# Patient Record
Sex: Female | Born: 1980 | Race: Black or African American | Hispanic: No | Marital: Single | Smoking: Former smoker
Health system: Southern US, Community
[De-identification: ages and names within clinical notes are randomized; demographics above are authoritative.]

## PROBLEM LIST (undated history)

## (undated) ENCOUNTER — Inpatient Hospital Stay (HOSPITAL_COMMUNITY): Payer: Self-pay

## (undated) DIAGNOSIS — R011 Cardiac murmur, unspecified: Secondary | ICD-10-CM

## (undated) DIAGNOSIS — J302 Other seasonal allergic rhinitis: Secondary | ICD-10-CM

## (undated) DIAGNOSIS — R51 Headache: Secondary | ICD-10-CM

## (undated) DIAGNOSIS — K219 Gastro-esophageal reflux disease without esophagitis: Secondary | ICD-10-CM

## (undated) DIAGNOSIS — F32A Depression, unspecified: Secondary | ICD-10-CM

## (undated) DIAGNOSIS — K649 Unspecified hemorrhoids: Secondary | ICD-10-CM

## (undated) DIAGNOSIS — F419 Anxiety disorder, unspecified: Secondary | ICD-10-CM

## (undated) DIAGNOSIS — F329 Major depressive disorder, single episode, unspecified: Secondary | ICD-10-CM

## (undated) DIAGNOSIS — D259 Leiomyoma of uterus, unspecified: Secondary | ICD-10-CM

## (undated) DIAGNOSIS — D500 Iron deficiency anemia secondary to blood loss (chronic): Secondary | ICD-10-CM

## (undated) DIAGNOSIS — I10 Essential (primary) hypertension: Secondary | ICD-10-CM

## (undated) DIAGNOSIS — T7840XA Allergy, unspecified, initial encounter: Secondary | ICD-10-CM

## (undated) DIAGNOSIS — K8020 Calculus of gallbladder without cholecystitis without obstruction: Secondary | ICD-10-CM

## (undated) DIAGNOSIS — K602 Anal fissure, unspecified: Secondary | ICD-10-CM

## (undated) DIAGNOSIS — K0889 Other specified disorders of teeth and supporting structures: Secondary | ICD-10-CM

## (undated) DIAGNOSIS — N939 Abnormal uterine and vaginal bleeding, unspecified: Secondary | ICD-10-CM

## (undated) DIAGNOSIS — R05 Cough: Secondary | ICD-10-CM

## (undated) HISTORY — PX: DILATION AND CURETTAGE OF UTERUS: SHX78

## (undated) HISTORY — DX: Allergy, unspecified, initial encounter: T78.40XA

## (undated) HISTORY — DX: Calculus of gallbladder without cholecystitis without obstruction: K80.20

## (undated) HISTORY — PX: TUBAL LIGATION: SHX77

## (undated) HISTORY — PX: THERAPEUTIC ABORTION: SHX798

## (undated) HISTORY — DX: Unspecified hemorrhoids: K64.9

## (undated) HISTORY — PX: CHOLECYSTECTOMY: SHX55

---

## 1998-01-04 ENCOUNTER — Ambulatory Visit (HOSPITAL_COMMUNITY): Admission: RE | Admit: 1998-01-04 | Discharge: 1998-01-04 | Payer: Self-pay | Admitting: Pulmonary Disease

## 1999-08-29 ENCOUNTER — Inpatient Hospital Stay (HOSPITAL_COMMUNITY): Admission: AD | Admit: 1999-08-29 | Discharge: 1999-08-29 | Payer: Self-pay | Admitting: Obstetrics & Gynecology

## 1999-09-02 ENCOUNTER — Other Ambulatory Visit: Admission: RE | Admit: 1999-09-02 | Discharge: 1999-09-02 | Payer: Self-pay | Admitting: Obstetrics and Gynecology

## 1999-11-01 ENCOUNTER — Inpatient Hospital Stay (HOSPITAL_COMMUNITY): Admission: AD | Admit: 1999-11-01 | Discharge: 1999-11-01 | Payer: Self-pay | Admitting: Obstetrics & Gynecology

## 2000-04-22 ENCOUNTER — Inpatient Hospital Stay (HOSPITAL_COMMUNITY): Admission: AD | Admit: 2000-04-22 | Discharge: 2000-04-25 | Payer: Self-pay | Admitting: Obstetrics and Gynecology

## 2001-01-15 ENCOUNTER — Ambulatory Visit (HOSPITAL_COMMUNITY): Admission: RE | Admit: 2001-01-15 | Discharge: 2001-01-15 | Payer: Self-pay | Admitting: Gastroenterology

## 2001-01-15 ENCOUNTER — Encounter: Payer: Self-pay | Admitting: Gastroenterology

## 2001-01-26 ENCOUNTER — Encounter (INDEPENDENT_AMBULATORY_CARE_PROVIDER_SITE_OTHER): Payer: Self-pay | Admitting: *Deleted

## 2001-01-26 ENCOUNTER — Observation Stay (HOSPITAL_COMMUNITY): Admission: RE | Admit: 2001-01-26 | Discharge: 2001-01-27 | Payer: Self-pay | Admitting: Surgery

## 2001-01-26 HISTORY — PX: CHOLECYSTECTOMY, LAPAROSCOPIC: SHX56

## 2001-08-27 ENCOUNTER — Other Ambulatory Visit: Admission: RE | Admit: 2001-08-27 | Discharge: 2001-08-27 | Payer: Self-pay | Admitting: Family Medicine

## 2001-09-17 ENCOUNTER — Ambulatory Visit (HOSPITAL_COMMUNITY): Admission: RE | Admit: 2001-09-17 | Discharge: 2001-09-17 | Payer: Self-pay | Admitting: Gastroenterology

## 2001-09-20 ENCOUNTER — Encounter: Payer: Self-pay | Admitting: Gastroenterology

## 2001-09-20 ENCOUNTER — Ambulatory Visit (HOSPITAL_COMMUNITY): Admission: RE | Admit: 2001-09-20 | Discharge: 2001-09-20 | Payer: Self-pay | Admitting: Gastroenterology

## 2003-06-27 ENCOUNTER — Emergency Department (HOSPITAL_COMMUNITY): Admission: EM | Admit: 2003-06-27 | Discharge: 2003-06-28 | Payer: Self-pay | Admitting: *Deleted

## 2003-12-10 ENCOUNTER — Emergency Department (HOSPITAL_COMMUNITY): Admission: EM | Admit: 2003-12-10 | Discharge: 2003-12-10 | Payer: Self-pay | Admitting: Internal Medicine

## 2004-05-29 ENCOUNTER — Emergency Department (HOSPITAL_COMMUNITY): Admission: EM | Admit: 2004-05-29 | Discharge: 2004-05-29 | Payer: Self-pay | Admitting: Family Medicine

## 2004-07-14 ENCOUNTER — Encounter (INDEPENDENT_AMBULATORY_CARE_PROVIDER_SITE_OTHER): Payer: Self-pay | Admitting: Nurse Practitioner

## 2004-07-18 ENCOUNTER — Emergency Department (HOSPITAL_COMMUNITY): Admission: EM | Admit: 2004-07-18 | Discharge: 2004-07-18 | Payer: Self-pay | Admitting: Family Medicine

## 2004-08-16 ENCOUNTER — Emergency Department (HOSPITAL_COMMUNITY): Admission: EM | Admit: 2004-08-16 | Discharge: 2004-08-16 | Payer: Self-pay | Admitting: Family Medicine

## 2004-08-29 ENCOUNTER — Emergency Department (HOSPITAL_COMMUNITY): Admission: EM | Admit: 2004-08-29 | Discharge: 2004-08-29 | Payer: Self-pay | Admitting: Family Medicine

## 2005-07-22 ENCOUNTER — Emergency Department (HOSPITAL_COMMUNITY): Admission: EM | Admit: 2005-07-22 | Discharge: 2005-07-22 | Payer: Self-pay | Admitting: Family Medicine

## 2005-08-05 ENCOUNTER — Emergency Department (HOSPITAL_COMMUNITY): Admission: EM | Admit: 2005-08-05 | Discharge: 2005-08-05 | Payer: Self-pay | Admitting: Family Medicine

## 2005-08-28 ENCOUNTER — Emergency Department (HOSPITAL_COMMUNITY): Admission: EM | Admit: 2005-08-28 | Discharge: 2005-08-28 | Payer: Self-pay | Admitting: Family Medicine

## 2005-09-24 ENCOUNTER — Ambulatory Visit: Payer: Self-pay | Admitting: Nurse Practitioner

## 2005-09-24 ENCOUNTER — Ambulatory Visit: Payer: Self-pay | Admitting: *Deleted

## 2005-10-09 ENCOUNTER — Ambulatory Visit: Payer: Self-pay | Admitting: Nurse Practitioner

## 2005-11-10 ENCOUNTER — Ambulatory Visit: Payer: Self-pay | Admitting: Nurse Practitioner

## 2005-12-01 ENCOUNTER — Ambulatory Visit: Payer: Self-pay | Admitting: Nurse Practitioner

## 2006-01-02 ENCOUNTER — Ambulatory Visit: Payer: Self-pay | Admitting: Nurse Practitioner

## 2006-01-02 LAB — CONVERTED CEMR LAB
TSH: 1.093 microintl units/mL
WBC, blood: 7.1 10*3/uL

## 2006-05-04 ENCOUNTER — Ambulatory Visit: Payer: Self-pay | Admitting: Nurse Practitioner

## 2006-06-23 ENCOUNTER — Ambulatory Visit: Payer: Self-pay | Admitting: Nurse Practitioner

## 2006-10-01 ENCOUNTER — Ambulatory Visit: Payer: Self-pay | Admitting: Nurse Practitioner

## 2007-01-26 ENCOUNTER — Ambulatory Visit: Payer: Self-pay | Admitting: Family Medicine

## 2007-03-19 ENCOUNTER — Encounter: Payer: Self-pay | Admitting: Nurse Practitioner

## 2007-03-19 DIAGNOSIS — J309 Allergic rhinitis, unspecified: Secondary | ICD-10-CM | POA: Insufficient documentation

## 2007-03-31 ENCOUNTER — Encounter (INDEPENDENT_AMBULATORY_CARE_PROVIDER_SITE_OTHER): Payer: Self-pay | Admitting: *Deleted

## 2007-04-06 ENCOUNTER — Ambulatory Visit: Payer: Self-pay | Admitting: Internal Medicine

## 2007-06-03 ENCOUNTER — Ambulatory Visit: Payer: Self-pay | Admitting: Family Medicine

## 2007-06-21 ENCOUNTER — Emergency Department (HOSPITAL_COMMUNITY): Admission: EM | Admit: 2007-06-21 | Discharge: 2007-06-21 | Payer: Self-pay | Admitting: Family Medicine

## 2007-06-30 ENCOUNTER — Emergency Department (HOSPITAL_COMMUNITY): Admission: EM | Admit: 2007-06-30 | Discharge: 2007-06-30 | Payer: Self-pay | Admitting: Family Medicine

## 2007-07-22 ENCOUNTER — Encounter (INDEPENDENT_AMBULATORY_CARE_PROVIDER_SITE_OTHER): Payer: Self-pay | Admitting: Nurse Practitioner

## 2007-07-22 ENCOUNTER — Ambulatory Visit: Payer: Self-pay | Admitting: Family Medicine

## 2007-07-22 LAB — CONVERTED CEMR LAB
AST: 19 units/L (ref 0–37)
Albumin: 4.4 g/dL (ref 3.5–5.2)
BUN: 14 mg/dL (ref 6–23)
Basophils Absolute: 0 10*3/uL (ref 0.0–0.1)
Calcium: 9 mg/dL (ref 8.4–10.5)
Chloride: 105 meq/L (ref 96–112)
Eosinophils Absolute: 0.1 10*3/uL (ref 0.0–0.7)
Glucose, Bld: 66 mg/dL — ABNORMAL LOW (ref 70–99)
HCT: 38 % (ref 36.0–46.0)
Hemoglobin: 12.5 g/dL (ref 12.0–15.0)
Lymphs Abs: 2.3 10*3/uL (ref 0.7–4.0)
Neutrophils Relative %: 55 % (ref 43–77)
Platelets: 204 10*3/uL (ref 150–400)
TSH: 0.509 microintl units/mL (ref 0.350–5.50)
Total Protein: 7.2 g/dL (ref 6.0–8.3)

## 2007-09-14 ENCOUNTER — Emergency Department (HOSPITAL_COMMUNITY): Admission: EM | Admit: 2007-09-14 | Discharge: 2007-09-14 | Payer: Self-pay | Admitting: Family Medicine

## 2007-09-28 ENCOUNTER — Ambulatory Visit: Payer: Self-pay | Admitting: Internal Medicine

## 2007-10-11 ENCOUNTER — Ambulatory Visit: Payer: Self-pay | Admitting: Family Medicine

## 2007-10-29 ENCOUNTER — Ambulatory Visit: Payer: Self-pay | Admitting: Internal Medicine

## 2007-11-08 ENCOUNTER — Ambulatory Visit: Payer: Self-pay | Admitting: Internal Medicine

## 2007-11-26 ENCOUNTER — Inpatient Hospital Stay (HOSPITAL_COMMUNITY): Admission: AD | Admit: 2007-11-26 | Discharge: 2007-11-26 | Payer: Self-pay | Admitting: Obstetrics and Gynecology

## 2008-02-21 ENCOUNTER — Ambulatory Visit (HOSPITAL_COMMUNITY): Admission: RE | Admit: 2008-02-21 | Discharge: 2008-02-21 | Payer: Self-pay | Admitting: Obstetrics

## 2008-05-22 ENCOUNTER — Ambulatory Visit (HOSPITAL_COMMUNITY): Admission: RE | Admit: 2008-05-22 | Discharge: 2008-05-22 | Payer: Self-pay | Admitting: Obstetrics

## 2008-06-29 ENCOUNTER — Ambulatory Visit (HOSPITAL_COMMUNITY): Admission: RE | Admit: 2008-06-29 | Discharge: 2008-06-29 | Payer: Self-pay | Admitting: Obstetrics

## 2008-07-10 ENCOUNTER — Inpatient Hospital Stay (HOSPITAL_COMMUNITY): Admission: RE | Admit: 2008-07-10 | Discharge: 2008-07-13 | Payer: Self-pay | Admitting: Obstetrics

## 2008-07-10 ENCOUNTER — Encounter: Payer: Self-pay | Admitting: Obstetrics

## 2008-07-20 ENCOUNTER — Inpatient Hospital Stay (HOSPITAL_COMMUNITY): Admission: AD | Admit: 2008-07-20 | Discharge: 2008-07-23 | Payer: Self-pay | Admitting: Obstetrics

## 2009-01-09 ENCOUNTER — Emergency Department (HOSPITAL_COMMUNITY): Admission: EM | Admit: 2009-01-09 | Discharge: 2009-01-09 | Payer: Self-pay | Admitting: Family Medicine

## 2009-04-12 ENCOUNTER — Emergency Department (HOSPITAL_COMMUNITY): Admission: EM | Admit: 2009-04-12 | Discharge: 2009-04-12 | Payer: Self-pay | Admitting: Family Medicine

## 2009-05-11 ENCOUNTER — Emergency Department (HOSPITAL_COMMUNITY): Admission: EM | Admit: 2009-05-11 | Discharge: 2009-05-11 | Payer: Self-pay | Admitting: Family Medicine

## 2010-10-21 LAB — POCT I-STAT, CHEM 8
BUN: 6 mg/dL (ref 6–23)
Chloride: 108 mEq/L (ref 96–112)
Creatinine, Ser: 0.6 mg/dL (ref 0.4–1.2)
Sodium: 140 mEq/L (ref 135–145)

## 2010-10-21 LAB — POCT URINALYSIS DIP (DEVICE)
Glucose, UA: NEGATIVE mg/dL
Nitrite: NEGATIVE
Specific Gravity, Urine: >=1.03 (ref 1.005–1.030)
pH: 5.5 (ref 5.0–8.0)

## 2010-10-21 LAB — POCT PREGNANCY, URINE: Preg Test, Ur: NEGATIVE

## 2010-10-28 LAB — URIC ACID: Uric Acid, Serum: 3.8 mg/dL (ref 2.4–7.0)

## 2010-10-28 LAB — CBC
HCT: 25.8 % — ABNORMAL LOW (ref 36.0–46.0)
Hemoglobin: 8.7 g/dL — ABNORMAL LOW (ref 12.0–15.0)
MCHC: 34.3 g/dL (ref 30.0–36.0)
MCV: 98.1 fL (ref 78.0–100.0)
RBC: 2.72 MIL/uL — ABNORMAL LOW (ref 3.87–5.11)
RDW: 14.2 % (ref 11.5–15.5)
RDW: 14.4 % (ref 11.5–15.5)

## 2010-10-28 LAB — COMPREHENSIVE METABOLIC PANEL
ALT: 44 U/L — ABNORMAL HIGH (ref 0–35)
ALT: 50 U/L — ABNORMAL HIGH (ref 0–35)
AST: 24 U/L (ref 0–37)
Albumin: 2.4 g/dL — ABNORMAL LOW (ref 3.5–5.2)
CO2: 27 mEq/L (ref 19–32)
Calcium: 8.2 mg/dL — ABNORMAL LOW (ref 8.4–10.5)
Creatinine, Ser: 0.79 mg/dL (ref 0.4–1.2)
GFR calc Af Amer: >60 mL/min (ref >60)
GFR calc non Af Amer: >60 mL/min (ref >60)
Glucose, Bld: 85 mg/dL (ref 70–99)
Potassium: 3.5 mEq/L (ref 3.5–5.1)
Sodium: 137 mEq/L (ref 135–145)
Sodium: 140 mEq/L (ref 135–145)
Total Bilirubin: 0.1 mg/dL — ABNORMAL LOW (ref 0.3–1.2)
Total Protein: 5.1 g/dL — ABNORMAL LOW (ref 6.0–8.3)
Total Protein: 5.8 g/dL — ABNORMAL LOW (ref 6.0–8.3)

## 2010-10-28 LAB — LACTATE DEHYDROGENASE: LDH: 278 U/L — ABNORMAL HIGH (ref 94–250)

## 2010-11-26 NOTE — Op Note (Signed)
[name], [name]                   ACCOUNT NO.:  [id number]   MEDICAL RECORD NO.:  [id number]          PATIENT TYPE:  INP   LOCATION:  9116                          FACILITY:  [facility]   PHYSICIAN:  Charles A. Clearance Coots, M.D.DATE OF BIRTH:  11-Feb-1981   DATE OF PROCEDURE:  DATE OF DISCHARGE:                               OPERATIVE REPORT   PREOPERATIVE DIAGNOSES:  1. Previous cesarean section at term, desires repeat cesarean section.  2. Low-lying placenta.   POSTOPERATIVE DIAGNOSES:  1. Previous cesarean section at term, desires repeat cesarean section.  2. Low-lying placenta.   PROCEDURE:  Repeat low-transverse cesarean section.   SURGEON:  Charles A. Clearance Coots, MD   ANESTHESIA:  Spinal.   ESTIMATED BLOOD LOSS:  1000 mL.   IV FLUIDS:  3400 mL.   URINE OUTPUT:  200 mL clear.   COMPLICATIONS:  None.   DRAINS:  Foley to gravity.   FINDINGS:  Viable female at 0826, Apgars of 8 at one minute and 9 at 5  minutes, weight of 8 pounds 1 ounce.  Normal uterus, ovaries, and  fallopian tubes.   OPERATION:  The patient was brought to the operating room and after  satisfactory spinal anesthesia, the abdomen was prepped and draped in  the usual sterile fashion and an indwelling Foley catheter was inserted  in the urinary bladder.  Pfannenstiel skin incision was made through the  previous scar with scalpel down to the fascia.  Fascia was nicked in the  midline and the fascial incision was extended to the left to the right  with curved Mayo scissors.  The superior and inferior fascial edges were  taken off of the rectus muscles with both blunt and sharp dissection.  Rectus muscle was bluntly divided in the midline.  Peritoneum was  entered digitally and was digitally extended to left and to the right.  The bladder blade was positioned in the incision and the vesicouterine  fold of peritoneum above the reflection of the urinary bladder was  grasped with forceps and was incised and undermined  with Metzenbaum  scissors.  An adhesion between the lower uterine segment, an area of the  vesicouterine fold, and the lower anterior abdominal wall was lysed with  cautery and the bladder flap was then bluntly developed.  Bladder blade  was repositioned in front of the urinary bladder placing it well off the  operative field.  Uterus was then entered transversely in the lower  uterine segment with scalpel.  Clear amniotic fluid was expelled.  The  uterine incision was then extended to the left and to the right with the  bandage scissors.  The vertex was then noted to be hyperextended and the  occiput was rotated into the incision, but could not be delivered with  fundal pressure due to the hyperextension.  A vacuum extractor was then  applied to the occiput and the occiput was then flexed into the incision  with the aid of fundal pressure from the assistant and delivery of the  vertex was then accomplished with one pull of the Mityvac mushroom cup  vacuum extractor.  The infant's mouth and nose was then suctioned with a  suction bulb and the delivery was then completed with the aid of fundal  pressure from the assistant.  The umbilical cord was doubly clamped and  cut, and the infant was handed off to the nursery staff.  The placenta  was then manually removed from the uterine cavity intact.  The  endometrial surface was thoroughly debrided with a dry lap sponge.  The  edges of the uterine incision were grasped with ring forceps.  Uterus  was closed with continuous interlocking suture of 0-Monocryl from each  corner to the center.  Hemostasis was excellent.  Pelvic cavity was then  thoroughly irrigated with warm saline solution and all clots were  removed.  The abdomen was then closed as follows.  The parietal  peritoneum was closed with continuous suture of 2-0 Monocryl.  The  fascia was closed with a continuous suture of 0-Vicryl.  Subcutaneous  tissue was thoroughly irrigated with warm  saline solution and all areas  of subcutaneous bleeding were coagulated with Bovie.  Skin was then  closed with continuous subcuticular suture of 4-0 Monocryl.  Sterile  bandage was applied to the incision closure.  Surgical technician  indicated that all needle, sponge, and instrument counts were correct  x2.  The patient tolerated procedure well, transported to the recovery  room in satisfactory condition.      Charles A. Clearance Coots, M.D.  Electronically Signed     CAH/MEDQ  D:  07/10/2008  T:  07/11/2008  Job:  [id number]

## 2010-11-26 NOTE — H&P (Signed)
NAMECATHRYN, [name]                   ACCOUNT NO.:  [id number]   MEDICAL RECORD NO.:  [id number]          PATIENT TYPE:  INP   LOCATION:  9371                          FACILITY:  [facility]   PHYSICIAN:  Roseanna Rainbow, M.D.DATE OF BIRTH:  November 05, 1980   DATE OF ADMISSION:  07/20/2008  DATE OF DISCHARGE:                              HISTORY & PHYSICAL   CHIEF COMPLAINT:  The patient is a 30 year old status post cesarean  delivery on December 28, now with elevated blood pressures and a  headache.   HISTORY OF PRESENT ILLNESS:  Please see the above.  She was scheduled  for an elective repeat cesarean delivery.  Her antepartum course had  been uncomplicated.  A [facility] visit on the day of presentation  revealed elevated blood pressures.  The patient was also complaining of  a frontal headache.   PAST MEDICAL HISTORY:  She denies past surgical history.  Please see the  above.  She is status post cholecystectomy.   MEDICATIONS:  Percocet and ibuprofen.   ALLERGIES:  LEVAQUIN.   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  Current  tobacco use, alcohol, and marijuana.   FAMILY HISTORY:  CVA, hypertension, and diabetes.   REVIEW OF SYSTEMS:  NEUROLOGIC:  Headache.   PHYSICAL EXAMINATION:  VITAL SIGNS:  Blood pressures 150s/90s.  GENERAL:  Minimal distress.  ABDOMEN:  Fundus firm.  Incision clean, dry and intact.  EXTREMITIES: Pretibial edema 2 to 3+ bilaterally.  PELVIC:  Lochia, scant lochia rubra.   LABS:  Hemoglobin 9.2, hematocrit 26.7, and platelets 305,000.  Creatinine 0.79, SGOT 25, and SGPT 50.  LDH 306.  Uric acid 3.8.   ASSESSMENT:  Postpartum pregnancy-induced hypertension with neurological  symptoms.   PLAN:  Admission; magnesium sulfate, seizure prophylaxis, and p.o.  labetalol.     Roseanna Rainbow, M.D.  Electronically Signed    LAJ/MEDQ  D:  07/21/2008  T:  07/21/2008  Job:  [id number]

## 2010-11-26 NOTE — Discharge Summary (Signed)
[name], Marilyn                   ACCOUNT NO.:  [id number]   MEDICAL RECORD NO.:  [id number]          PATIENT TYPE:  INP   LOCATION:  9305                          [facility]ACILITY:  [facility]   PHYSICIAN:  Charles A. Clearance Coots, M.D.DATE OF BIRTH:  [birth year]   DATE OF ADMISSION:  07/20/2008  DATE OF DISCHARGE:  07/23/2008                               DISCHARGE SUMMARY   ADMITTING DIAGNOSIS:  Postpartum preeclampsia.   DISCHARGE DIAGNOSIS:  Postpartum preeclampsia, improved.   Discharged home on p.o. labetalol and hydrochlorothiazide in good  condition.   REASON FOR ADMISSION:  A 30 year old black female presents with  increased blood pressures, headache.  The patient is status post repeat  cesarean section for a low-lying placenta on July 10, 2008.  The  patient noted the onset of headaches and had elevated blood pressures  several days prior to admission.   PAST SURGICAL HISTORY:  Cesarean section, cholecystectomy.   ILLNESSES:  None.   MEDICATIONS:  Prenatal vitamins.   ALLERGIES:  Levaquin.   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   FAMILY HISTORY:  Positive for hypertension, stroke, and diabetes.   REVIEW OF SYSTEMS:  Positive for headache, otherwise noncontributory.   PHYSICAL EXAMINATION:  Well-nourished, well-developed female in no acute  distress.  Vital signs were stable.  Blood pressure 140-150 over 90-100.  Lungs were clear to auscultation bilaterally.  Heart:  Regular rate and  rhythm.  Abdomen:  Nontender.   IMPRESSION:  Postpartum, postoperative cesarean section, pregnancy-  induced hypertension.   PLAN:  Admit.  Start IV magnesium sulfate, prophylaxis for seizure, and  p.o. labetalol for blood pressure management.   ADMITTING LABS:  Hemoglobin 9.2, hematocrit 26, white blood cell count  8200, platelets 305,000.  Comprehensive metabolic panel was within  normal limits.  LDH was 306.  Uric acid was  3.8.   HOSPITAL COURSE:  The patient was admitted, started on IV magnesium  sulfate, and on p.o. labetalol.  She responded well to therapy.  By  hospital day #2, blood pressures were under good control.  Magnesium  sulfate was discontinued.  The patient continued to have stable blood  pressures and was discharged home on hospital day #3 in good condition  with stable blood pressure.   DISCHARGE DISPOSITION:  Medications, continue prenatal vitamins,  continue Percocet and ibuprofen for pain, labetalol 200 mg p.o. twice a  day was prescribed for blood pressure management, and  hydrochlorothiazide 25 mg p.o. daily also prescribed for blood pressure.  The patient will call office for followup appointment in 1 week.      Charles A. Clearance Coots, M.D.  Electronically Signed     CAH/MEDQ  D:  07/23/2008  T:  07/23/2008  Job:  [id number]

## 2010-11-26 NOTE — H&P (Signed)
NAMEAUBRIANNA, [name]                   ACCOUNT NO.:  [id number]   MEDICAL RECORD NO.:  [id number]          PATIENT TYPE:  INP   LOCATION:  9116                          [facility]ILITY[facility]  [facility]   PHYSICIAN:  Charles A. Clearance Coots, M.D.DATE OF BIRTH:  04/13/1981   DATE OF ADMISSION:  07/10/2008  DATE OF DISCHARGE:                              HISTORY & PHYSICAL   HISTORY OF PRESENT ILLNESS:  A 30 year old black female, G4, P1,  estimated date of confinement of July 17, 2007, history of placenta  previa, which was posterior, complete placenta previa on ultrasound.  The patient also has a history of previous cesarean section.  Ultrasound  which was done with a plan of amniocentesis at 37 weeks' revealed no  placenta previa, but still a low-lying placenta approximately 2 cm from  the internal os.  The patient still desired a repeat cesarean section,  but because there was no longer placenta previa, cesarean section was  scheduled as an elective cesarean section at 39 weeks' gestation.   PAST MEDICAL HISTORY AND SURGERY:  Cesarean section and cholecystectomy.   ILLNESSES:  None.   MEDICATIONS:  Prenatal vitamins.   ALLERGIES:  LEVAQUIN.   SOCIAL HISTORY:  Single, works as a Pharmacologist.  Positive  tobacco, alcohol, and marijuana.   FAMILY HISTORY:  Positive for stroke, hypertension, and diabetes.   REVIEW OF SYSTEMS:  Noncontributory.   PHYSICAL EXAMINATION:  GENERAL:  Well-nourished, well-developed female  in no acute distress.  VITAL SIGNS:  Temperature 98.2, pulse 80, respiratory rate 18, blood  pressure 133/79, height 5 feet 4 inches, and weight 218 pounds.  HEENT:  Within normal limits.  NECK:  Supple without adenopathy.  LUNGS:  Clear to auscultation bilaterally.  HEART:  Regular rate and rhythm.  ABDOMEN:  Gravid, nontender.  Cervical exam was omitted.   IMPRESSION:  A 39 weeks' gestation, previous cesarean section, and low-  lying placenta.   PLAN:  Repeat cesarean  section.      Charles A. Clearance Coots, M.D.  Electronically Signed     CAH/MEDQ  D:  07/10/2008  T:  07/11/2008  Job:  [id number]

## 2010-11-29 NOTE — Discharge Summary (Signed)
[facility]  Patient:    [name], [name]                          MRN: [id number] Adm. Date:  [id number] Disc. Date: [id number] Attending:  Conley Simmonds A Dictator:   Leilani Able, P.A.                           Discharge Summary  FINAL DIAGNOSES:              Intrauterine pregnancy at [redacted] weeks gestation, fetal macrosomia, right peritubal adhesions.  PROCEDURE:                    Primary low transverse cesarean section and right peritubal lysis of adhesions.  SURGEON:                      Brook A. Edward Jolly, M.D.  ASSISTANT:                    Gerlene Burdock D. Arlyce Dice, M.D.  COMPLICATIONS:                None.                                This 30 year old G3, P0-0-2-0 presents at [redacted] weeks gestation.  Was admitted at this time for cesarean section.  Patient had had an ultrasound performed the week before in the office showing an estimated fetal weight of 4005 g with normal AFI.  Patient did not go into labor over the next week and was found to have an unfavorable cervix at the time.  The discussion was held with the patient regarding induction of labor versus proceeding with a cesarean section.  The patient chose to proceed with a cesarean section.  She was taken to the operating room on April 22, 2000 by Dr. Conley Simmonds where a primary low transverse cesarean section was performed with the delivery of a 10 pound 13 ounce female infant with Apgars of 8 and 9. Delivery went without complications.  There were some right peritubal adhesions that were lysed as well and that went without complication. Patients postoperative course was benign without significant fever.  She was felt ready for discharge on postoperative day #3.  She was sent home on a regular diet.  Told to decrease activities.  Told to continue prenatal vitamins.  Was given Tylox one to two q.4h. as needed for pain.  Told she could use over-the-counter pain medicine.  Follow up in the office in  four weeks.DD:  05/20/00 TD:  05/20/00 Job: [id number]

## 2010-11-29 NOTE — Procedures (Signed)
[place]  Patient:    [name], [name] Visit Number: [id number] MRN: [id number]          Service Type: END Location: ENDO Attending Physician:  Charna Elizabeth Dictated by:   Anselmo Rod, M.D. Proc. Date: 09/20/01 Admit Date:  09/17/2001 Discharge Date: 09/17/2001   CC:         Geraldo Pitter, M.D.   Procedure Report  DATE OF BIRTH:  1980/12/17.  REFERRING PHYSICIAN:  Geraldo Pitter, M.D.  PROCEDURE PERFORMED:  Esophagogastroduodenoscopy changed to esophagoscopy.  ENDOSCOPIST:  Anselmo Rod, M.D.  INSTRUMENT USED:  Olympus video panendoscope.  INDICATIONS FOR PROCEDURE:  Epigastric pain after cholecystectomy in the recent past.  The patient has also been taking PPIs on a regular basis but continues to have abdominal discomfort, mostly in the epigastric.  Rule out peptic ulcer disease, esophagitis, gastritis, etc.  PREPROCEDURE PREPARATION:  Informed consent was procured from the patient. The patient was fasted for eight hours prior to the procedure.  PREPROCEDURE PHYSICAL:  The patient had stable vital signs.  Neck supple. Chest clear to auscultation.  S1, S2 regular.  Abdomen soft with normal bowel sounds.  Epigastric tenderness on palpation with guarding.  No rebound, no rigidity, no hepatosplenomegaly.  DESCRIPTION OF PROCEDURE:  The patient was placed in left lateral decubitus position and sedated with 100 mg of Demerol and 15 mg of Versed intravenously. Once the patient was adequately sedated and maintained on low-flow oxygen and continuous cardiac monitoring, the Olympus video panendoscope was advanced through the mouthpiece, over the tongue, into the esophagus under direct vision.  The patient was very combative and in spite of heavy sedation, pulled the scope out on several occasions.  The procedure was aborted.  At this point it was planned to do an upper GI series. Dictated by:   Anselmo Rod, M.D. Attending  Physician:  Charna Elizabeth DD:  09/17/01 TD:  09/20/01 Job: [id number]

## 2010-11-29 NOTE — Discharge Summary (Signed)
[name], [name]                   ACCOUNT NO.:  [id number]   MEDICAL RECORD NO.:  [id number]          PATIENT TYPE:  INP   LOCATION:  9116                          [facility]ILITY[facility]   PHYSICIAN:  Charles A. Clearance Coots, M.D.DATE OF BIRTH:  1980-11-07   DATE OF ADMISSION:  07/10/2008  DATE OF DISCHARGE:  07/13/2008                               DISCHARGE SUMMARY   ADMITTING DIAGNOSIS:  Previous cesarean section, desired repeat cesarean  section.   DISCHARGE DIAGNOSES:  Previous cesarean section, desired repeat cesarean  section status post repeat low-transverse cesarean section on July 10, 2008.   Viable female, delivered at 0826, Apgars of 8 at one minute and 9 and  five minutes, weight of 3665 g, length of 52.07 cm.  Mother and infant  discharged home in good condition.   REASON FOR ADMISSION:  A 30 year old G4, P10, estimated date of  confinement of July 16, 2008, has a history of placenta previa, which  is posterior, complete placenta previa on ultrasound.  The patient also  has a history of previous cesarean section and desired a repeat cesarean  section.  Ultrasound, which was done with plan of amniocentesis at 37  weeks revealed no placenta previa, but still a low-lying placenta  approximately 2 cm from internal os.  The patient still desired repeat  cesarean section, but because there is no longer placenta previa,  cesarean section was scheduled as an elective cesarean section at 39  weeks' gestation.   PAST MEDICAL HISTORY/SURGERY:  Cesarean section and cholecystectomy.   ILLNESSES:  None.   MEDICATIONS:  Prenatal vitamins.   ALLERGIES:  LEVAQUIN.   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   FAMILY HISTORY:  Positive for stroke, hypertension, and diabetes.   REVIEW OF SYSTEMS:  Noncontributory.   PHYSICAL EXAMINATION:  GENERAL:  Well-nourished, well-developed female  in no acute distress.  VITAL  SIGNS:  Temperature 98.2, pulse 80, respiratory rate 18, blood  pressure 133/79, height 5 feet 4 inches, weight 218 pounds.  HEENT:  Within normal limits.  LUNGS:  Clear to auscultation bilaterally.  HEART:  Regular rate and rhythm.  ABDOMEN:  Gravid, nontender.  Cervical exam was omitted.   IMPRESSION:  A 39 weeks' gestation, previous cesarean section, low-lying  placenta, desires a repeat cesarean section.   PLAN:  Repeat cesarean section.   ADMITTING LABORATORIES:  Hemoglobin 11.2, hematocrit 32.6, white blood  cell count 8900, platelets 175,000.  RPR was nonreactive.   HOSPITAL COURSE:  The patient underwent repeat low-transverse cesarean  section on July 10, 2008.  There were no intraoperative  complications.  Postoperative course was uncomplicated.  The patient was  discharged home on postop day #3 in good condition.  The patient did  have anemia postoperatively with a hemoglobin of 8.6, but she had no  orthostatic changes and was able to ambulate quite well without  dizziness or headache or fatigue.   DISCHARGE DISPOSITION:  Medications:  Continue prenatal vitamins.  Iron  was prescribed for anemia.  Ibuprofen and  Percocet were prescribed for  pain.  Routine written instructions were given for discharge after  cesarean section.  The patient is to call office for followup  appointment in 2 weeks.      Charles A. Clearance Coots, M.D.     CAH/MEDQ  D:  09/20/2008  T:  09/20/2008  Job:  [id number]

## 2010-11-29 NOTE — Op Note (Signed)
[facility]  Patient:    [name], [name]                          MRN: [id number] Proc. Date: 04/22/00 Attending:  Conley Simmonds A                           Operative Report  PREOPERATIVE DIAGNOSES:       1. Intrauterine pregnancy at [redacted] weeks                                  gestation.                               2. Suspected fetal macrosomia.  POSTOPERATIVE DIAGNOSES:      1. Intrauterine gestation at 40 weeks.                               2. Fetal macrosomia.                               3. Right peritubal adhesions.  PROCEDURE:                    Primary low segment transverse cesarean section with right peritubal adhesiolysis.  ANESTHESIA:                   Duramorph spinal.  SURGEON:                      Brook A. Edward Jolly, M.D.  ASSISTANT:                    Gerlene Burdock D. Arlyce Dice, M.D.  INTRAVENOUS FLUIDS:           1700 Ringers lactate.  ESTIMATED BLOOD LOSS:         800 cc.  URINE OUTPUT:                 125 cc.  COMPLICATIONS:                None.  INDICATION FOR PROCEDURE:     The patient was a 30 year old gravida 3, para 0-0-2-0 African-American female at [redacted] weeks gestation by a first trimester ultrasound, who, in the office, was noted to measure size greater than dates consistently for several office visits.  An ultrasound on April 15, 2000 documented an estimated fetal weight of 4005 g, with a normal amniotic fluid volume.  The patient was observed for labor over the next week and she was found to have an unfavorable cervix at that time.  A discussion was held with the patient regarding induction of labor for a vaginal delivery, versus proceeding with a primary low segment transverse cesarean section for a suspected fetal macrosomia.  The patient chose to proceed with a primary C-section after the risks and benefits of each were reviewed.  FINDINGS:                     A viable female infant was born at 51 with Apgars of 8 at one  minute and 9 at five minutes.  The weight was 10  pounds 13 ounces.  There were filmy adhesions noted around the right fallopian tube, which was otherwise normal.  The left fallopian tube and bilateral ovaries were unremarkable.  The patients placenta had a normal insertion of a three-vessel cord in a normal configuration.  SPECIMENS:                    None.  DESCRIPTION OF PROCEDURE:     With an IV in place, the patient was taken to the operating room after she was properly identified.  The patient received a spinal anesthetic and she was then placed in the supine position.  The patients abdomen was sterilely prepped and a Foley catheter was sterilely placed inside the bladder; she was then sterilely draped.  After adequate anesthesia was ensured, a Pfannenstiel incision was created sharply with a scalpel.  This was carried down to the fascia using the same. The fascia was scored in the midline with a scalpel, and the incision was carried out bilaterally using a Mayo scissors.  The rectus muscles were separated from the overlying rectus fascia using sharp dissection with the Mayo scissors superiorly and inferiorly.  The rectus muscles were then separated sharply in the midline with the Mayo scissors.  The parietal peritoneum was entered sharply after it was elevated with two snap clamps. The parietal peritoneum was then incised cranially and caudally, with care taken to avoid cystotomy.  The lower uterine segment was exposed, and the serosa was first incised with a Metzenbaum scissors.  The lower uterine segment incision was well-away from the bladder.  A transverse incision was therefore created with a scalpel in the lower uterine segment.  The incision was carried out bilaterally using a bandage scissors.  The membranes were ruptured with an Allis clamp and clear fluid was noted.  A hand was inserted through the uterine incision and the fetal vertex was elevated.  A Mityvac was then  placed over the fetal vertex and proper pressure was applied.  The fetal vertex was delivered with assistance of the vacuum extractor.  The nares and mouth were then suctioned and the infants shoulders were delivered, followed by the remainder of the infant.  The infant was noted to be vigorous at birth.  Cord was doubly clamped and cut and the newborn was carried over to the awaiting pediatricians.  The cord blood was collected and the placenta was then manually extracted.  The patient received cefotetan 1 g IV x 1 and Pitocin 20 units IV x 1.  A moistened lap pad was then used to remove any remaining membranes from within the uterine cavity.  The uterus was exteriorized at this point for better visualization of the uterine incision.  The uterine incision was then closed with a running-locked suture of #1 chromic.  There was some bleeding noted along the right apex of the incision and a figure-of-eight suture of #1 chromic was placed in this location for excellent hemostasis. Attention was turned to the right fallopian tube where filmy adhesions were noted along the proximal and midportion of the fallopian tube.  These were lysed using monopolar cautery.  The uterus was returned to the peritoneal cavity and the incision was again reexamined.  There was no evidence of any ongoing bleeding from the uterus.  The edges of the fascia were examined at this time and the fascia was closed with a running suture of 0 Vicryl.  The subcutaneous tissue was irrigated with crystalloid solution and small bleeding vessels were  cauterized with monopolar cautery.  The skin was closed with staples and a sterile bandage was placed over the incision.  The uterus was expressed of any remaining clots and the patient was cleansed of any Betadine.  The patient was then escorted to the recovery room in stable and awake condition.  There were no complications to the procedure.  All needle, instrument and sponge  counts were correct. DD:  04/22/00 TD:  04/24/00 Job: [id number]

## 2010-11-29 NOTE — Op Note (Signed)
[facility]  Patient:    Marilyn, [name]                          MRN: [id number] Proc. Date: 01/26/01 Adm. Date:  [id number] Attending:  Abigail Miyamoto A                           Operative Report  PREOPERATIVE DIAGNOSIS:  Symptomatic cholelithiasis.  POSTOPERATIVE DIAGNOSIS:  Symptomatic cholelithiasis.  PROCEDURE:  Laparoscopic cholecystectomy.  SURGEON:  Abigail Miyamoto, M.D.  ASSISTANT:  Sheppard Plumber. Earlene Plater, M.D.  ANESTHESIA:  General endotracheal and 0.25% Marcaine plain.  ESTIMATED BLOOD LOSS:  Minimal.  DESCRIPTION OF PROCEDURE:  The patient was brought to the operating room and identified as [name].  She was placed supine on the operating room table, and general anesthesia was induced.  Her abdomen was then prepped and draped in the usual sterile fashion.  Using the #15 blade, a small, transverse incision was made below the umbilicus.  Incision was carried down through the fascia which was then opened with the scalpel.  A hemostat was then used to pass into the peritoneal cavity.  Next, a 0 Vicryl pursestring suture was placed around the fascial opening.  The Hasson port was then placed through the opening, and insufflation of the abdomen was begun.  Next, an 11 mm port was placed in the patients epigastrium, and two 5 mm ports were placed in the patients right flank, all under direct vision.  The gallbladder was then identified and grasped and retracted from out of the liver bed.  Dissection was then carried down to the base of the gallbladder.  The cystic duct and then cystic artery were each dissected out.  The cystic duct was clipped off three times proximally and once distally and transected with the scissors. The cystic artery and the branch were each clipped twice proximally and once distally and transected as well.  The gallbladder was then dissected free from the liver bed with the electrocautery.  Hemostasis appeared to be  achieved in the liver bed with the cautery.  Once the gallbladder was excised from the liver bed, it was pulled out through the port of the umbilicus.  The 0 Vicryl at the umbilicus was then tied in place.  The abdomen was then irrigated with normal saline.  Again, hemostasis appeared to be achieved.  All ports were then removed under direct vision, and the abdomen was deflated.  All skin incisions were then anesthetized with 0.25% Marcaine and closed with 4-0 Vicryl subcuticular sutures.  Steri-Strips, gauze, and Tegaderm were then applied.  The patient tolerated the procedure well.  All sponge, needle and instrument counts were correct at the end of the procedure.  The patient was then extubated in the operating room and taken in stable condition to the recovery room. DD:  01/26/01 TD:  01/27/01 Job: [id number]

## 2010-12-24 ENCOUNTER — Other Ambulatory Visit (HOSPITAL_COMMUNITY): Payer: Self-pay | Admitting: Family Medicine

## 2010-12-24 DIAGNOSIS — K439 Ventral hernia without obstruction or gangrene: Secondary | ICD-10-CM

## 2010-12-30 ENCOUNTER — Inpatient Hospital Stay (HOSPITAL_COMMUNITY): Admission: RE | Admit: 2010-12-30 | Payer: Self-pay | Source: Ambulatory Visit

## 2011-01-03 ENCOUNTER — Other Ambulatory Visit (HOSPITAL_COMMUNITY): Payer: Self-pay

## 2011-01-07 ENCOUNTER — Other Ambulatory Visit (HOSPITAL_COMMUNITY): Payer: Self-pay

## 2011-04-07 LAB — POCT RAPID STREP A: Streptococcus, Group A Screen (Direct): NEGATIVE

## 2011-04-09 LAB — POCT PREGNANCY, URINE: Preg Test, Ur: POSITIVE

## 2011-04-09 LAB — URINALYSIS, ROUTINE W REFLEX MICROSCOPIC
Glucose, UA: NEGATIVE
Hgb urine dipstick: NEGATIVE
Protein, ur: NEGATIVE
pH: 7

## 2011-04-09 LAB — WET PREP, GENITAL: Trich, Wet Prep: NONE SEEN

## 2011-04-09 LAB — GC/CHLAMYDIA PROBE AMP, GENITAL: GC Probe Amp, Genital: NEGATIVE

## 2011-04-18 LAB — RPR: RPR Ser Ql: NONREACTIVE

## 2011-04-18 LAB — CBC
HCT: 25 % — ABNORMAL LOW (ref 36.0–46.0)
MCHC: 34.4 g/dL (ref 30.0–36.0)
MCV: 98.4 fL (ref 78.0–100.0)
Platelets: 147 10*3/uL — ABNORMAL LOW (ref 150–400)
RBC: 3.33 MIL/uL — ABNORMAL LOW (ref 3.87–5.11)
RDW: 14.7 % (ref 11.5–15.5)
WBC: 8.9 10*3/uL (ref 4.0–10.5)

## 2011-04-18 LAB — ABO/RH: ABO/RH(D): A POS

## 2011-04-18 LAB — TYPE AND SCREEN

## 2013-04-06 ENCOUNTER — Encounter (HOSPITAL_COMMUNITY): Payer: Self-pay | Admitting: *Deleted

## 2013-04-06 ENCOUNTER — Inpatient Hospital Stay (HOSPITAL_COMMUNITY): Payer: Medicaid Other

## 2013-04-06 ENCOUNTER — Inpatient Hospital Stay (HOSPITAL_COMMUNITY)
Admission: AD | Admit: 2013-04-06 | Discharge: 2013-04-06 | Disposition: A | Discharge status: Home / Self Care | Payer: Medicaid Other | Source: Ambulatory Visit | Attending: Obstetrics & Gynecology | Admitting: Obstetrics & Gynecology

## 2013-04-06 DIAGNOSIS — O210 Mild hyperemesis gravidarum: Secondary | ICD-10-CM | POA: Insufficient documentation

## 2013-04-06 DIAGNOSIS — R112 Nausea with vomiting, unspecified: Secondary | ICD-10-CM

## 2013-04-06 DIAGNOSIS — O200 Threatened abortion: Secondary | ICD-10-CM

## 2013-04-06 HISTORY — DX: Major depressive disorder, single episode, unspecified: F32.9

## 2013-04-06 HISTORY — DX: Depression, unspecified: F32.A

## 2013-04-06 LAB — URINALYSIS, ROUTINE W REFLEX MICROSCOPIC
Bilirubin Urine: NEGATIVE
Ketones, ur: NEGATIVE mg/dL
Nitrite: NEGATIVE
Specific Gravity, Urine: >1.03 — ABNORMAL HIGH (ref 1.005–1.030)
Urobilinogen, UA: 1 mg/dL (ref 0.0–1.0)

## 2013-04-06 LAB — WET PREP, GENITAL
Clue Cells Wet Prep HPF POC: NONE SEEN
Trich, Wet Prep: NONE SEEN
Yeast Wet Prep HPF POC: NONE SEEN

## 2013-04-06 LAB — CBC
MCH: 31.4 pg (ref 26.0–34.0)
MCHC: 33.8 g/dL (ref 30.0–36.0)
Platelets: 239 10*3/uL (ref 150–400)

## 2013-04-06 LAB — HCG, QUANTITATIVE, PREGNANCY: hCG, Beta Chain, Quant, S: 47 m[IU]/mL — ABNORMAL HIGH (ref <5)

## 2013-04-06 MED ORDER — ONDANSETRON 8 MG PO TBDP: 8.0000 mg | ORAL_TABLET | Freq: Three times a day (TID) | ORAL | Status: DC | PRN

## 2013-04-06 NOTE — MAU Note (Signed)
+  preg test last week.  Started spotting yesterday.

## 2013-04-06 NOTE — MAU Provider Note (Signed)
History     CSN: [id number]  Arrival date and time: 04/06/13 1021   First Provider Initiated Contact with Patient 04/06/13 1214      Chief Complaint  Patient presents with  . Possible Pregnancy  . Vaginal Bleeding   HPI Comments: [name] 69 [id number] presents to MAU today for spotting in early pregnancy starting yesterday. She has mostly noticed brown bleeding. She has had 3 TAB and 1 SAB. LMP 02/26/13 making her [redacted]w[redacted]d pregnant     Possible Pregnancy  Vaginal Bleeding      Past Medical History  Diagnosis Date  . Depression     never been treated , "probably got some "    Past Surgical History  Procedure Laterality Date  . Dilation and curettage of uterus    . Therapeutic abortion    . Cesarean section    . Cholecystectomy, laparoscopic      Family History  Problem Relation Age of Onset  . Diabetes Father   . Hypertension Father   . Cancer Maternal Aunt     breast    History  Substance Use Topics  . Smoking status: Former Smoker    Types: Cigarettes  . Smokeless tobacco: Never Used     Comment: prior to preg  . Alcohol Use: No    Allergies:  Allergies  Allergen Reactions  . Levofloxacin Itching    Prescriptions prior to admission  Medication Sig Dispense Refill  . Pseudoephedrine-Ibuprofen 30-200 MG TABS Take 1 tablet by mouth as needed.        Review of Systems  Constitutional: Negative.   HENT: Negative.   Eyes: Negative.   Respiratory: Negative.   Cardiovascular: Negative.   Gastrointestinal: Negative.   Genitourinary: Negative.        Vaginal bleeding  Musculoskeletal: Negative.   Skin: Negative.   Neurological: Negative.   Psychiatric/Behavioral: Negative.    Physical Exam   Blood pressure 134/70, pulse 81, temperature 98.3 F (36.8 C), temperature source Oral, resp. rate 18, height 5\' 3"  (1.6 m), weight 189 lb (85.73 kg), last menstrual period 02/26/2013.  Physical Exam  Constitutional: She is oriented to person,  place, and time. She appears well-developed and well-nourished. No distress.  HENT:  Head: Normocephalic and atraumatic.  GI: Soft. She exhibits no distension and no mass. There is tenderness. There is no guarding.  Genitourinary:  External : Negative Vaginal: small amount of brown blood Cervix: negative and closed Biman: negative  Neurological: She is alert and oriented to person, place, and time.  Skin: Skin is dry.  Psychiatric: She has a normal mood and affect.   Results for orders placed during the hospital encounter of 04/06/13 (from the past 24 hour(s))  URINALYSIS, ROUTINE W REFLEX MICROSCOPIC     Status: Abnormal   Collection Time    04/06/13 10:40 AM      Result Value Range   Color, Urine YELLOW  YELLOW   APPearance CLEAR  CLEAR   Specific Gravity, Urine >1.030 (*) 1.005 - 1.030   pH 6.0  5.0 - 8.0   Glucose, UA NEGATIVE  NEGATIVE mg/dL   Hgb urine dipstick MODERATE (*) NEGATIVE   Bilirubin Urine NEGATIVE  NEGATIVE   Ketones, ur NEGATIVE  NEGATIVE mg/dL   Protein, ur NEGATIVE  NEGATIVE mg/dL   Urobilinogen, UA 1.0  0.0 - 1.0 mg/dL   Nitrite NEGATIVE  NEGATIVE   Leukocytes, UA NEGATIVE  NEGATIVE  URINE MICROSCOPIC-ADD ON     Status: None  Collection Time    04/06/13 10:40 AM      Result Value Range   Squamous Epithelial / LPF RARE  RARE   WBC, UA 3-6  <3 WBC/hpf   Bacteria, UA RARE  RARE   Urine-Other MUCOUS PRESENT    POCT PREGNANCY, URINE     Status: Abnormal   Collection Time    04/06/13 10:47 AM      Result Value Range   Preg Test, Ur POSITIVE (*) NEGATIVE  WET PREP, GENITAL     Status: Abnormal   Collection Time    04/06/13 12:20 PM      Result Value Range   Yeast Wet Prep HPF POC NONE SEEN  NONE SEEN   Trich, Wet Prep NONE SEEN  NONE SEEN   Clue Cells Wet Prep HPF POC NONE SEEN  NONE SEEN   WBC, Wet Prep HPF POC FEW (*) NONE SEEN  CBC     Status: Abnormal   Collection Time    04/06/13 12:33 PM      Result Value Range   WBC 8.0  4.0 - 10.5 K/uL    RBC 3.82 (*) 3.87 - 5.11 MIL/uL   Hemoglobin 12.0  12.0 - 15.0 g/dL   HCT 16.1 (*) 09.6 - 04.5 %   MCV 92.9  78.0 - 100.0 fL   MCH 31.4  26.0 - 34.0 pg   MCHC 33.8  30.0 - 36.0 g/dL   RDW 40.9  81.1 - 91.4 %   Platelets 239  150 - 400 K/uL  HCG, QUANTITATIVE, PREGNANCY     Status: Abnormal   Collection Time    04/06/13 12:33 PM      Result Value Range   hCG, Beta Chain, Quant, S 47 (*) <5 mIU/mL  ABO/RH     Status: None   Collection Time    04/06/13 12:33 PM      Result Value Range   ABO/RH(D) A POS     US Ob Comp Less 14 Wks  04/06/2013   *RADIOLOGY REPORT*  Clinical Data: Vaginal bleeding, 5-week-4-day pregnant by LMP. Quantitative beta HCG 47.  OBSTETRIC <14 WK [place] AND TRANSVAGINAL OB US  Technique:  Both transabdominal and transvaginal ultrasound examinations were performed for complete evaluation of the gestation as well as the maternal uterus, adnexal regions, and pelvic cul-de-sac.  Transvaginal technique was performed to assess early pregnancy.  Comparison:  None.  Intrauterine gestational sac:  Not visualized Yolk sac: Not visualized Embryo: Not visualized Cardiac Activity: No embryo visualized  Maternal uterus/adnexae: The ovaries are normal  IMPRESSION: No intrauterine gestational sac, yolk sac, fetal pole, or cardiac activity visualized. Differential considerations include intrauterine gestation too early to be sonographically visualized, spontaneous abortion, or ectopic pregnancy.  Consider follow-up ultrasound in 10 days and serial quantitative beta HCG follow-up.   Original Report Authenticated By: Christiana Pellant, M.D.   US Ob Transvaginal  04/06/2013   *RADIOLOGY REPORT*  Clinical Data: Vaginal bleeding, 5-week-4-day pregnant by LMP. Quantitative beta HCG 47.  OBSTETRIC <14 WK [place] AND TRANSVAGINAL OB US  Technique:  Both transabdominal and transvaginal ultrasound examinations were performed for complete evaluation of the gestation as well as the maternal uterus, adnexal regions,  and pelvic cul-de-sac.  Transvaginal technique was performed to assess early pregnancy.  Comparison:  None.  Intrauterine gestational sac:  Not visualized Yolk sac: Not visualized Embryo: Not visualized Cardiac Activity: No embryo visualized  Maternal uterus/adnexae: The ovaries are normal  IMPRESSION: No intrauterine gestational sac, yolk  sac, fetal pole, or cardiac activity visualized. Differential considerations include intrauterine gestation too early to be sonographically visualized, spontaneous abortion, or ectopic pregnancy.  Consider follow-up ultrasound in 10 days and serial quantitative beta HCG follow-up.   Original Report Authenticated By: Christiana Pellant, M.D.     MAU Course  Procedures  MDM CBC, UA, U/S, Quant, ABORh, Wet prep, GC/Chlamydia  Assessment and Plan   A: Threatened miscarriage Nausea  P: Above work up Repeat Quant 48 hours Repeat U/S 7 days Zofran 8 mg po Establish prenatal care  Carolynn Serve 04/06/2013, 1:24 PM

## 2013-04-06 NOTE — MAU Note (Signed)
Pelvic done, labs drawn, pt in [place]. Will return to lobby to wait results.

## 2013-04-07 ENCOUNTER — Inpatient Hospital Stay (HOSPITAL_COMMUNITY)
Admission: AD | Admit: 2013-04-07 | Discharge: 2013-04-07 | Disposition: A | Discharge status: Home / Self Care | Payer: Medicaid Other | Source: Ambulatory Visit | Attending: Obstetrics & Gynecology | Admitting: Obstetrics & Gynecology

## 2013-04-07 DIAGNOSIS — N949 Unspecified condition associated with female genital organs and menstrual cycle: Secondary | ICD-10-CM | POA: Insufficient documentation

## 2013-04-07 DIAGNOSIS — R109 Unspecified abdominal pain: Secondary | ICD-10-CM | POA: Insufficient documentation

## 2013-04-07 DIAGNOSIS — O039 Complete or unspecified spontaneous abortion without complication: Secondary | ICD-10-CM

## 2013-04-07 DIAGNOSIS — N938 Other specified abnormal uterine and vaginal bleeding: Secondary | ICD-10-CM | POA: Insufficient documentation

## 2013-04-07 LAB — GC/CHLAMYDIA PROBE AMP: CT Probe RNA: NEGATIVE

## 2013-04-07 MED ORDER — IBUPROFEN 600 MG PO TABS: 600.0000 mg | ORAL_TABLET | Freq: Four times a day (QID) | ORAL | Status: DC | PRN

## 2013-04-07 NOTE — MAU Provider Note (Signed)
History     CSN: [id number]  Arrival date and time: 04/07/13 1201   None     Chief Complaint  Patient presents with  . Vaginal Bleeding  . Abdominal Pain   HPI This is a 32 y.o. female at [redacted]w[redacted]d who presents with c/o bleeding this morning, less so today. Was seen yesterday for spotting. Cultures were negative. Quant was 47. [place] was negative.  RN Note  Patient states that this am when she got up she had heavy bleeding with clots in the toilet. Was seen in MAU yesterday for spotting.       OB History   Grav Para Term Preterm Abortions TAB SAB Ect Mult Living   7 2 2  0 4 3 1   1       Past Medical History  Diagnosis Date  . Depression     never been treated , "probably got some "    Past Surgical History  Procedure Laterality Date  . Dilation and curettage of uterus    . Therapeutic abortion    . Cesarean section    . Cholecystectomy, laparoscopic      Family History  Problem Relation Age of Onset  . Diabetes Father   . Hypertension Father   . Cancer Maternal Aunt     breast    History  Substance Use Topics  . Smoking status: Former Smoker    Types: Cigarettes  . Smokeless tobacco: Never Used     Comment: prior to preg  . Alcohol Use: No    Allergies:  Allergies  Allergen Reactions  . Levofloxacin Itching    Prescriptions prior to admission  Medication Sig Dispense Refill  . ondansetron (ZOFRAN ODT) 8 MG disintegrating tablet Take 1 tablet (8 mg total) by mouth every 8 (eight) hours as needed for nausea.  20 tablet  0    Review of Systems  Constitutional: Negative for fever, chills and malaise/fatigue.  Gastrointestinal: Positive for abdominal pain (mild cramps). Negative for nausea, vomiting, diarrhea and constipation.  Genitourinary: Negative for dysuria.       Small to moderate blood in vault    Physical Exam   Blood pressure 118/58, pulse 74, temperature 98.3 F (36.8 C), temperature source Oral, resp. rate 20, last menstrual period  02/26/2013, SpO2 100.00%.  Physical Exam  Constitutional: She is oriented to person, place, and time. She appears well-developed and well-nourished. No distress.  Cardiovascular: Normal rate.   Respiratory: Effort normal.  GI: Soft. She exhibits no distension and no mass. There is no tenderness. There is no rebound and no guarding.  Genitourinary: Uterus normal. Vaginal discharge (moderate blood) found.  Musculoskeletal: Normal range of motion.  Neurological: She is alert and oriented to person, place, and time.  Skin: Skin is warm and dry.  Psychiatric: She has a normal mood and affect.    MAU Course  Procedures  MDM Results for orders placed during the hospital encounter of 04/07/13 (from the past 72 hour(s))  HCG, QUANTITATIVE, PREGNANCY     Status: Abnormal   Collection Time    04/07/13  1:21 PM      Result Value Range   hCG, Beta Chain, Quant, S 36 (*) <5 mIU/mL   Comment:              GEST. AGE      CONC.  (mIU/mL)       <=1 WEEK        5 - 50  2 WEEKS       50 - 500         3 WEEKS       100 - 10,000         4 WEEKS     1,000 - 30,000         5 WEEKS     3,500 - 115,000       6-8 WEEKS     12,000 - 270,000        12 WEEKS     15,000 - 220,000                FEMALE AND NON-PREGNANT FEMALE:         LESS THAN 5 mIU/mL   Quant the day before was 47.  Assessment and Plan  A:  Pregnancy at [redacted]w[redacted]d       Bleeding in pregnancy with declining quant HCG levels      Inevitable SAB  P:  Discharge home      Followup in office  [facility] 04/07/2013, 3:02 PM

## 2013-04-07 NOTE — MAU Note (Signed)
Patient states that this am when she got up she had heavy bleeding with clots in the toilet. Was seen in MAU yesterday for spotting.

## 2013-04-14 ENCOUNTER — Other Ambulatory Visit: Payer: Medicaid Other

## 2013-04-14 DIAGNOSIS — O039 Complete or unspecified spontaneous abortion without complication: Secondary | ICD-10-CM

## 2013-04-19 ENCOUNTER — Telehealth: Payer: Self-pay | Admitting: *Deleted

## 2013-04-22 ENCOUNTER — Telehealth: Payer: Self-pay

## 2013-04-22 NOTE — Telephone Encounter (Signed)
Pt called for results of her beta quant.

## 2013-04-26 NOTE — Telephone Encounter (Signed)
Pt called the front desk and asked about her beta results.  I informed her that her results are < 2 and that she has a f/u appt scheduled for 05/05/13 @ 1315.  Pt stated understanding.

## 2013-04-27 NOTE — Telephone Encounter (Signed)
Opened in error

## 2013-05-05 ENCOUNTER — Encounter: Payer: Self-pay | Admitting: Medical

## 2013-07-16 ENCOUNTER — Encounter (HOSPITAL_COMMUNITY): Payer: Self-pay | Admitting: Emergency Medicine

## 2013-07-16 ENCOUNTER — Emergency Department (HOSPITAL_COMMUNITY)
Admission: EM | Admit: 2013-07-16 | Discharge: 2013-07-17 | Disposition: A | Discharge status: Home / Self Care | Payer: No Typology Code available for payment source | Attending: Emergency Medicine | Admitting: Emergency Medicine

## 2013-07-16 ENCOUNTER — Emergency Department (INDEPENDENT_AMBULATORY_CARE_PROVIDER_SITE_OTHER)
Admission: EM | Admit: 2013-07-16 | Discharge: 2013-07-16 | Disposition: A | Discharge status: Hospice | Payer: Self-pay | Source: Home / Self Care | Attending: Family Medicine | Admitting: Family Medicine

## 2013-07-16 ENCOUNTER — Emergency Department (HOSPITAL_COMMUNITY): Payer: No Typology Code available for payment source

## 2013-07-16 DIAGNOSIS — Z87891 Personal history of nicotine dependence: Secondary | ICD-10-CM | POA: Insufficient documentation

## 2013-07-16 DIAGNOSIS — S199XXA Unspecified injury of neck, initial encounter: Secondary | ICD-10-CM

## 2013-07-16 DIAGNOSIS — R519 Headache, unspecified: Secondary | ICD-10-CM

## 2013-07-16 DIAGNOSIS — Z8659 Personal history of other mental and behavioral disorders: Secondary | ICD-10-CM | POA: Insufficient documentation

## 2013-07-16 DIAGNOSIS — M542 Cervicalgia: Secondary | ICD-10-CM

## 2013-07-16 DIAGNOSIS — R51 Headache: Secondary | ICD-10-CM

## 2013-07-16 DIAGNOSIS — Y9389 Activity, other specified: Secondary | ICD-10-CM | POA: Insufficient documentation

## 2013-07-16 DIAGNOSIS — S0990XA Unspecified injury of head, initial encounter: Secondary | ICD-10-CM | POA: Insufficient documentation

## 2013-07-16 DIAGNOSIS — Y92410 Unspecified street and highway as the place of occurrence of the external cause: Secondary | ICD-10-CM | POA: Insufficient documentation

## 2013-07-16 DIAGNOSIS — Z791 Long term (current) use of non-steroidal anti-inflammatories (NSAID): Secondary | ICD-10-CM | POA: Insufficient documentation

## 2013-07-16 DIAGNOSIS — S0993XA Unspecified injury of face, initial encounter: Secondary | ICD-10-CM | POA: Insufficient documentation

## 2013-07-16 MED ORDER — SODIUM CHLORIDE 0.9 % IV SOLN
Freq: Once | INTRAVENOUS | Status: AC
  Administered 2013-07-16: 20:00:00 via INTRAVENOUS

## 2013-07-16 MED ORDER — ONDANSETRON HCL 4 MG/2ML IJ SOLN
INTRAMUSCULAR | Status: AC
  Administered 2013-07-16: 4 mg
  Filled 2013-07-16: qty 2

## 2013-07-16 MED ORDER — ONDANSETRON 4 MG PO TBDP
ORAL_TABLET | ORAL | Status: AC
  Filled 2013-07-16: qty 1

## 2013-07-16 MED ORDER — ONDANSETRON HCL 4 MG/2ML IJ SOLN
INTRAMUSCULAR | Status: AC
  Filled 2013-07-16: qty 2

## 2013-07-16 MED ORDER — ONDANSETRON HCL 4 MG/2ML IJ SOLN
4.0000 mg | Freq: Once | INTRAMUSCULAR | Status: AC
  Administered 2013-07-16: 4 mg via INTRAVENOUS

## 2013-07-16 MED ORDER — IBUPROFEN 800 MG PO TABS: 800.0000 mg | ORAL_TABLET | Freq: Three times a day (TID) | ORAL | Status: DC

## 2013-07-16 MED ORDER — CYCLOBENZAPRINE HCL 10 MG PO TABS: 10.0000 mg | ORAL_TABLET | Freq: Two times a day (BID) | ORAL | Status: DC | PRN
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
## 2013-07-16 NOTE — ED Notes (Signed)
Pt actively vomiting in room with EDP at bedside.

## 2013-07-16 NOTE — Discharge Instructions (Signed)
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
## 2014-03-13 ENCOUNTER — Encounter (HOSPITAL_COMMUNITY): Payer: Self-pay | Admitting: *Deleted

## 2014-03-13 ENCOUNTER — Inpatient Hospital Stay (HOSPITAL_COMMUNITY)
Admission: AD | Admit: 2014-03-13 | Discharge: 2014-03-13 | Disposition: A | Discharge status: Home / Self Care | Payer: Medicaid Other | Source: Ambulatory Visit | Attending: Obstetrics and Gynecology | Admitting: Obstetrics and Gynecology

## 2014-03-13 DIAGNOSIS — O219 Vomiting of pregnancy, unspecified: Secondary | ICD-10-CM

## 2014-03-13 DIAGNOSIS — O99330 Smoking (tobacco) complicating pregnancy, unspecified trimester: Secondary | ICD-10-CM | POA: Insufficient documentation

## 2014-03-13 DIAGNOSIS — O210 Mild hyperemesis gravidarum: Secondary | ICD-10-CM

## 2014-03-13 HISTORY — DX: Headache: R51

## 2014-03-13 HISTORY — DX: Anxiety disorder, unspecified: F41.9

## 2014-03-13 LAB — URINALYSIS, ROUTINE W REFLEX MICROSCOPIC
BILIRUBIN URINE: NEGATIVE
Glucose, UA: NEGATIVE mg/dL
Hgb urine dipstick: NEGATIVE
KETONES UR: 15 mg/dL — AB
Leukocytes, UA: NEGATIVE
NITRITE: NEGATIVE
PROTEIN: NEGATIVE mg/dL
Specific Gravity, Urine: 1.025 (ref 1.005–1.030)
UROBILINOGEN UA: 1 mg/dL (ref 0.0–1.0)
pH: 6 (ref 5.0–8.0)

## 2014-03-13 LAB — POCT PREGNANCY, URINE: Preg Test, Ur: POSITIVE — AB

## 2014-03-13 MED ORDER — DOXYLAMINE-PYRIDOXINE 10-10 MG PO TBEC: 10.0000 [IU] | DELAYED_RELEASE_TABLET | Freq: Every day | ORAL | Status: DC

## 2014-03-13 NOTE — MAU Note (Signed)
Pos HPT last Tuesday, nauseated since last week, intermittent vomiting.  Denies pain or bleeding.

## 2014-03-13 NOTE — Discharge Instructions (Signed)

## 2014-03-13 NOTE — MAU Provider Note (Signed)
History     CSN: [id number]  Arrival date and time: 03/13/14 1401   None     Chief Complaint  Patient presents with  . Nausea   HPI [name] 33 y.o. [id number] [redacted]w[redacted]d took a positive HPT 6 days ago.  She presents today for pregnancy test with proof to be provided and complaining of nausea.  She has not eaten much of anything today.  Over the last week she has vomited 0-3 times per day.  She does not feel like eating or drinking.  No bleeding or abdominal pain.  No dysuria.    OB History   Grav Para Term Preterm Abortions TAB SAB Ect Mult Living   8 2 2  0 4 3 1   2       Past Medical History  Diagnosis Date  . Depression     never been treated , "probably got some "  . Anxiety   . WCHENIDP(824.2)     Past Surgical History  Procedure Laterality Date  . Dilation and curettage of uterus    . Therapeutic abortion    . Cesarean section    . Cholecystectomy, laparoscopic      Family History  Problem Relation Age of Onset  . Diabetes Father   . Hypertension Father   . Cancer Maternal Aunt     breast    History  Substance Use Topics  . Smoking status: Current Some Day Smoker    Types: Cigarettes  . Smokeless tobacco: Never Used     Comment: prior to preg  . Alcohol Use: No    Allergies:  Allergies  Allergen Reactions  . Levofloxacin Itching    Prescriptions prior to admission  Medication Sig Dispense Refill  . cyclobenzaprine (FLEXERIL) 10 MG tablet Take 1 tablet (10 mg total) by mouth 2 (two) times daily as needed for muscle spasms.  10 tablet  0  . ibuprofen (ADVIL,MOTRIN) 200 MG tablet Take 200 mg by mouth every 6 (six) hours as needed.      Marland Kitchen ibuprofen (ADVIL,MOTRIN) 800 MG tablet Take 1 tablet (800 mg total) by mouth 3 (three) times daily.  21 tablet  0    Review of Systems  Constitutional: Positive for chills and diaphoresis. Negative for fever.  HENT: Positive for congestion. Negative for sore throat.   Respiratory: Positive for cough and wheezing.  Negative for shortness of breath.   Cardiovascular: Negative for chest pain and palpitations.  Gastrointestinal: Positive for heartburn, nausea and vomiting. Negative for abdominal pain.  Genitourinary: Negative for dysuria and urgency.  Neurological: Positive for headaches.   Physical Exam   Blood pressure 141/84, pulse 89, temperature 99.1 F (37.3 C), temperature source Oral, resp. rate 20, height 5\' 3"  (1.6 m), weight 82.918 kg (182 lb 12.8 oz), last menstrual period 02/03/2014.  Physical Exam  Constitutional: She is oriented to person, place, and time. She appears well-developed and well-nourished. No distress.  HENT:  Head: Normocephalic and atraumatic.  Eyes: EOM are normal.  Neck: Normal range of motion.  Cardiovascular: Normal rate, regular rhythm and normal heart sounds.  Exam reveals no gallop and no friction rub.   No murmur heard. Respiratory: Effort normal and breath sounds normal. No respiratory distress.  GI: Soft. Bowel sounds are normal. She exhibits no distension. There is no tenderness.  Neurological: She is alert and oriented to person, place, and time.  Skin: Skin is warm and dry. She is not diaphoretic.  Psychiatric: She has a normal mood  and affect. Her behavior is normal.   Results for orders placed during the hospital encounter of 03/13/14 (from the past 24 hour(s))  URINALYSIS, ROUTINE W REFLEX MICROSCOPIC     Status: Abnormal   Collection Time    03/13/14  2:09 PM      Result Value Ref Range   Color, Urine YELLOW  YELLOW   APPearance CLEAR  CLEAR   Specific Gravity, Urine 1.025  1.005 - 1.030   pH 6.0  5.0 - 8.0   Glucose, UA NEGATIVE  NEGATIVE mg/dL   Hgb urine dipstick NEGATIVE  NEGATIVE   Bilirubin Urine NEGATIVE  NEGATIVE   Ketones, ur 15 (*) NEGATIVE mg/dL   Protein, ur NEGATIVE  NEGATIVE mg/dL   Urobilinogen, UA 1.0  0.0 - 1.0 mg/dL   Nitrite NEGATIVE  NEGATIVE   Leukocytes, UA NEGATIVE  NEGATIVE  POCT PREGNANCY, URINE     Status: Abnormal    Collection Time    03/13/14  2:14 PM      Result Value Ref Range   Preg Test, Ur POSITIVE (*) NEGATIVE    MAU Course  Procedures none  MDM + PRT  Assessment and Plan  Assessment: nausea and vomiting in pregnancy  Plan:  Discharge to home Encourage fluids Diclegis qhs Pregnancy verification letter provided [facility] asap PNV daily Return to MAU for emergency: vaginal bleeding, abdominal pain, etc  Paticia Stack 03/13/2014, 5:04 PM

## 2014-03-14 ENCOUNTER — Telehealth: Payer: Self-pay | Admitting: Advanced Practice Midwife

## 2014-03-14 DIAGNOSIS — O219 Vomiting of pregnancy, unspecified: Secondary | ICD-10-CM

## 2014-03-14 MED ORDER — METOCLOPRAMIDE HCL 10 MG PO TABS: 10.0000 mg | ORAL_TABLET | Freq: Four times a day (QID) | ORAL | Status: DC

## 2014-03-14 NOTE — Telephone Encounter (Signed)
C/O drowsiness from [employer]. Requesting different med for N/V pregnancy.

## 2014-03-14 NOTE — MAU Provider Note (Signed)
Attestation of Attending Supervision of Advanced Practitioner (CNM/NP): Evaluation and management procedures were performed by the Advanced Practitioner under my supervision and collaboration.  I have reviewed the Advanced Practitioner's note and chart, and I agree with the management and plan.  Jassmine Vandruff 03/14/2014 7:31 AM

## 2014-04-17 ENCOUNTER — Encounter: Payer: Self-pay | Admitting: Obstetrics

## 2014-04-18 ENCOUNTER — Encounter: Payer: Self-pay | Admitting: Obstetrics

## 2014-04-18 ENCOUNTER — Other Ambulatory Visit: Payer: Self-pay | Admitting: Obstetrics

## 2014-04-18 ENCOUNTER — Ambulatory Visit (INDEPENDENT_AMBULATORY_CARE_PROVIDER_SITE_OTHER): Payer: Medicaid Other | Admitting: Obstetrics

## 2014-04-18 VITALS — BP 116/70 | HR 83 | Temp 98.5°F | Wt 190.0 lb

## 2014-04-18 DIAGNOSIS — Z98891 History of uterine scar from previous surgery: Secondary | ICD-10-CM

## 2014-04-18 DIAGNOSIS — Z3481 Encounter for supervision of other normal pregnancy, first trimester: Secondary | ICD-10-CM

## 2014-04-18 DIAGNOSIS — O2241 Hemorrhoids in pregnancy, first trimester: Secondary | ICD-10-CM

## 2014-04-18 DIAGNOSIS — O09521 Supervision of elderly multigravida, first trimester: Secondary | ICD-10-CM

## 2014-04-18 DIAGNOSIS — Z9889 Other specified postprocedural states: Secondary | ICD-10-CM

## 2014-04-18 LAB — POCT URINALYSIS DIPSTICK
Bilirubin, UA: NEGATIVE
Glucose, UA: NEGATIVE
Ketones, UA: NEGATIVE
LEUKOCYTES UA: NEGATIVE
NITRITE UA: NEGATIVE
PH UA: 5
Spec Grav, UA: 1.02
Urobilinogen, UA: NEGATIVE

## 2014-04-18 MED ORDER — DOCUSATE SODIUM 100 MG PO CAPS: 100.0000 mg | ORAL_CAPSULE | Freq: Two times a day (BID) | ORAL | Status: DC

## 2014-04-18 MED ORDER — CITRANATAL HARMONY 27-1-260 MG PO CAPS: 1.0000 | ORAL_CAPSULE | Freq: Every day | ORAL | Status: DC

## 2014-04-18 NOTE — Progress Notes (Signed)
Subjective:    [name] is being seen today for her first obstetrical visit.  This is not a planned pregnancy. She is at [redacted]w[redacted]d gestation. Her obstetrical history is significant for advanced maternal age. Relationship with FOB: significant other, living together. Patient does intend to breast feed. Pregnancy history fully reviewed.  The information documented in the HPI was reviewed and verified.  Menstrual History: OB History   Grav Para Term Preterm Abortions TAB SAB Ect Mult Living   8 2 2  0 4 3 1   2        Patient's last menstrual period was 02/03/2014.    Past Medical History  Diagnosis Date  . Depression     never been treated , "probably got some "  . Anxiety   . ACZYSAYT(016.0)     Past Surgical History  Procedure Laterality Date  . Dilation and curettage of uterus    . Therapeutic abortion    . Cesarean section    . Cholecystectomy, laparoscopic       (Not in a hospital admission) Allergies  Allergen Reactions  . Levofloxacin Itching    History  Substance Use Topics  . Smoking status: Former Smoker    Types: Cigarettes  . Smokeless tobacco: Former Systems developer    Quit date: 04/04/2014     Comment: prior to preg  . Alcohol Use: No    Family History  Problem Relation Age of Onset  . Diabetes Father   . Hypertension Father   . Cancer Maternal Aunt     breast     Review of Systems Constitutional: negative for weight loss Gastrointestinal: negative for vomiting Genitourinary:negative for genital lesions and vaginal discharge and dysuria Musculoskeletal:negative for back pain Behavioral/Psych: negative for abusive relationship, depression, illegal drug usage and tobacco use     Objective:    BP 116/70  Pulse 83  Temp(Src) 98.5 F (36.9 C)  Wt 190 lb (86.183 kg)  LMP 02/03/2014 General Appearance:    Alert, cooperative, no distress, appears stated age  Head:    Normocephalic, without obvious abnormality, atraumatic  Eyes:    PERRL,  conjunctiva/corneas clear, EOM's intact, fundi    benign, both eyes  Ears:    Normal TM's and external ear canals, both ears  Nose:   Nares normal, septum midline, mucosa normal, no drainage    or sinus tenderness  Throat:   Lips, mucosa, and tongue normal; teeth and gums normal  Neck:   Supple, symmetrical, trachea midline, no adenopathy;    thyroid:  no enlargement/tenderness/nodules; no carotid   bruit or JVD  Back:     Symmetric, no curvature, ROM normal, no CVA tenderness  Lungs:     Clear to auscultation bilaterally, respirations unlabored  Chest Wall:    No tenderness or deformity   Heart:    Regular rate and rhythm, S1 and S2 normal, no murmur, rub   or gallop  Breast Exam:    No tenderness, masses, or nipple abnormality  Abdomen:     Soft, non-tender, bowel sounds active all four quadrants,    no masses, no organomegaly  Genitalia:    Normal female without lesion, discharge or tenderness  Extremities:   Extremities normal, atraumatic, no cyanosis or edema  Pulses:   2+ and symmetric all extremities  Skin:   Skin color, texture, turgor normal, no rashes or lesions  Lymph nodes:   Cervical, supraclavicular, and axillary nodes normal  Neurologic:   CNII-XII intact, normal strength, sensation and  reflexes    throughout      Lab Review Urine pregnancy test Labs reviewed yes Radiologic studies reviewed yes Assessment:    Pregnancy at [redacted]w[redacted]d weeks   AMA  Previous C/S x 2   Plan:   Referred to MFM    Prenatal vitamins.  Counseling provided regarding continued use of seat belts, cessation of alcohol consumption, smoking or use of illicit drugs; infection precautions i.e., influenza/TDAP immunizations, toxoplasmosis,CMV, parvovirus, listeria and varicella; workplace safety, exercise during pregnancy; routine dental care, safe medications, sexual activity, hot tubs, saunas, pools, travel, caffeine use, fish and methlymercury, potential toxins, hair treatments, varicose  veins Weight gain recommendations per IOM guidelines reviewed: underweight/BMI< 18.5--> gain 28 - 40 lbs; normal weight/BMI 18.5 - 24.9--> gain 25 - 35 lbs; overweight/BMI 25 - 29.9--> gain 15 - 25 lbs; obese/BMI >30->gain  11 - 20 lbs Problem list reviewed and updated. FIRST/CF mutation testing/NIPT/QUAD SCREEN/fragile X/Ashkenazi Jewish population testing/Spinal muscular atrophy discussed: requested. Role of ultrasound in pregnancy discussed; fetal survey: requested. Amniocentesis discussed: undecided. VBAC calculator score: VBAC consent form provided Meds ordered this encounter  Medications  . Prenat-FeFmCb-DSS-FA-DHA w/o A (CITRANATAL HARMONY) 27-1-260 MG CAPS    Sig: Take 1 capsule by mouth daily before breakfast.    Dispense:  90 capsule    Refill:  3  . docusate sodium (COLACE) 100 MG capsule    Sig: Take 1 capsule (100 mg total) by mouth 2 (two) times daily.    Dispense:  60 capsule    Refill:  11   Orders Placed This Encounter  Procedures  . Culture, OB Urine  . WET PREP BY MOLECULAR PROBE  . GC/Chlamydia Probe Amp  . US OB Comp Less 14 Wks    Standing Status: Future     Number of Occurrences:      Standing Expiration Date: 06/19/2015    Order Specific Question:  Reason for Exam (SYMPTOM  OR DIAGNOSIS REQUIRED)    Answer:  AMA    Order Specific Question:  Preferred imaging location?    Answer:  [facility]  . Obstetric panel  . HIV antibody  . Hemoglobinopathy evaluation  . Varicella zoster antibody, IgG  . Vit D  25 hydroxy (rtn osteoporosis monitoring)  . TSH  . AMB Referral to Maternal Fetal Medicine (MFM)    Referral Priority:  Routine    Referral Type:  Consultation    Number of Visits Requested:  1  . POCT urinalysis dipstick    Follow up in 4 weeks.

## 2014-04-19 ENCOUNTER — Other Ambulatory Visit: Payer: Self-pay | Admitting: Obstetrics

## 2014-04-19 DIAGNOSIS — B9689 Other specified bacterial agents as the cause of diseases classified elsewhere: Secondary | ICD-10-CM

## 2014-04-19 DIAGNOSIS — N760 Acute vaginitis: Principal | ICD-10-CM

## 2014-04-19 LAB — OBSTETRIC PANEL
Antibody Screen: NEGATIVE
BASOS PCT: 0 % (ref 0–1)
Basophils Absolute: 0 10*3/uL (ref 0.0–0.1)
EOS ABS: 0.1 10*3/uL (ref 0.0–0.7)
Eosinophils Relative: 1 % (ref 0–5)
HCT: 33 % — ABNORMAL LOW (ref 36.0–46.0)
Hemoglobin: 11.5 g/dL — ABNORMAL LOW (ref 12.0–15.0)
Hepatitis B Surface Ag: NEGATIVE
LYMPHS ABS: 2.4 10*3/uL (ref 0.7–4.0)
Lymphocytes Relative: 24 % (ref 12–46)
MCH: 32.6 pg (ref 26.0–34.0)
MCHC: 34.8 g/dL (ref 30.0–36.0)
MCV: 93.5 fL (ref 78.0–100.0)
Monocytes Absolute: 0.6 10*3/uL (ref 0.1–1.0)
Monocytes Relative: 6 % (ref 3–12)
Neutro Abs: 7 10*3/uL (ref 1.7–7.7)
Neutrophils Relative %: 69 % (ref 43–77)
PLATELETS: 273 10*3/uL (ref 150–400)
RBC: 3.53 MIL/uL — AB (ref 3.87–5.11)
RDW: 13.7 % (ref 11.5–15.5)
Rh Type: POSITIVE
Rubella: 0.94 Index — ABNORMAL HIGH (ref <0.90)
WBC: 10.2 10*3/uL (ref 4.0–10.5)

## 2014-04-19 LAB — WET PREP BY MOLECULAR PROBE
Candida species: NEGATIVE
Gardnerella vaginalis: POSITIVE — AB
Trichomonas vaginosis: NEGATIVE

## 2014-04-19 LAB — GC/CHLAMYDIA PROBE AMP
CT Probe RNA: NEGATIVE
GC Probe RNA: NEGATIVE

## 2014-04-19 LAB — HIV ANTIBODY (ROUTINE TESTING W REFLEX): HIV: NONREACTIVE

## 2014-04-19 LAB — TSH: TSH: 0.495 u[IU]/mL (ref 0.350–4.500)

## 2014-04-19 LAB — VARICELLA ZOSTER ANTIBODY, IGG: Varicella IgG: 1465 Index — ABNORMAL HIGH (ref <135.00)

## 2014-04-19 LAB — VITAMIN D 25 HYDROXY (VIT D DEFICIENCY, FRACTURES): Vit D, 25-Hydroxy: <10 ng/mL — ABNORMAL LOW (ref 30–89)

## 2014-04-19 MED ORDER — CLINDAMYCIN HCL 300 MG PO CAPS: 300.0000 mg | ORAL_CAPSULE | Freq: Three times a day (TID) | ORAL | Status: DC

## 2014-04-20 LAB — HEMOGLOBINOPATHY EVALUATION
HGB A: 96.6 % — AB (ref 96.8–97.8)
HGB S QUANTITAION: 0 %
Hemoglobin Other: 0 %
Hgb A2 Quant: 2.6 % (ref 2.2–3.2)
Hgb F Quant: 0.8 % (ref 0.0–2.0)

## 2014-04-20 LAB — CULTURE, OB URINE: Colony Count: 75000

## 2014-05-01 ENCOUNTER — Telehealth: Payer: Self-pay | Admitting: *Deleted

## 2014-05-01 NOTE — Telephone Encounter (Signed)
Patient states she is having heartburn and states OTC meds are not helping. Patient is requesting a prescription.  Patient states she also has a hernia and states it is painful now that she is pregnant. Patient is wanting to know if there is anything that can be done for i at this time?

## 2014-05-02 NOTE — Telephone Encounter (Signed)
Test

## 2014-05-03 NOTE — Telephone Encounter (Signed)
Itmann may be helpful during pregnancy. Need referral to General Surgeon after delivery.

## 2014-05-03 NOTE — Telephone Encounter (Signed)
LM on VM to CB

## 2014-05-12 NOTE — Telephone Encounter (Signed)
Response printed- patient has an appointment 11/3 and will review at appointment.

## 2014-05-15 ENCOUNTER — Encounter: Payer: Self-pay | Admitting: Obstetrics

## 2014-05-16 ENCOUNTER — Encounter: Payer: Self-pay | Admitting: Obstetrics

## 2014-05-16 ENCOUNTER — Ambulatory Visit (INDEPENDENT_AMBULATORY_CARE_PROVIDER_SITE_OTHER): Payer: Medicaid Other | Admitting: Obstetrics

## 2014-05-16 VITALS — BP 125/56 | HR 89 | Temp 98.3°F | Wt 196.0 lb

## 2014-05-16 DIAGNOSIS — O09522 Supervision of elderly multigravida, second trimester: Secondary | ICD-10-CM

## 2014-05-16 DIAGNOSIS — O09529 Supervision of elderly multigravida, unspecified trimester: Secondary | ICD-10-CM | POA: Insufficient documentation

## 2014-05-16 DIAGNOSIS — K219 Gastro-esophageal reflux disease without esophagitis: Secondary | ICD-10-CM

## 2014-05-16 DIAGNOSIS — Z3482 Encounter for supervision of other normal pregnancy, second trimester: Secondary | ICD-10-CM

## 2014-05-16 HISTORY — DX: Gastro-esophageal reflux disease without esophagitis: K21.9

## 2014-05-16 LAB — POCT URINALYSIS DIPSTICK
BILIRUBIN UA: NEGATIVE
Blood, UA: NEGATIVE
GLUCOSE UA: NEGATIVE
Ketones, UA: NEGATIVE
LEUKOCYTES UA: NEGATIVE
Nitrite, UA: NEGATIVE
Protein, UA: NEGATIVE
Spec Grav, UA: 1.025
Urobilinogen, UA: NEGATIVE
pH, UA: 5

## 2014-05-16 MED ORDER — OMEPRAZOLE 20 MG PO CPDR: 20.0000 mg | DELAYED_RELEASE_CAPSULE | Freq: Two times a day (BID) | ORAL | Status: DC

## 2014-05-16 NOTE — Progress Notes (Signed)
  Subjective:    [name] is a 33 y.o. female being seen today for her obstetrical visit. She is at [redacted]w[redacted]d gestation. Patient reports: backache and heartburn.  Problem List Items Addressed This Visit    AMA (advanced maternal age) multigravida 22+ - Primary   Relevant Orders      AMB Referral to Maternal Fetal Medicine (MFM)      US OB Comp + 14 Wk   GERD without esophagitis   Relevant Medications      omeprazole (PRILOSEC) capsule    Other Visit Diagnoses    Encounter for supervision of other normal pregnancy in second trimester        Relevant Orders       POCT urinalysis dipstick      Patient Active Problem List   Diagnosis Date Noted  . GERD without esophagitis 05/16/2014  . AMA (advanced maternal age) multigravida 35+ 05/16/2014  . ALLERGIC RHINITIS 03/19/2007    Objective:     BP 125/56 mmHg  Pulse 89  Temp(Src) 98.3 F (36.8 C)  Wt 196 lb (88.905 kg)  LMP 02/03/2014 Uterine Size: Below umbilicus     Assessment:    Pregnancy @ [redacted]w[redacted]d  Weeks.  AMA. Doing well    Plan:   Referred to MFM for AMA.  Problem list reviewed and updated. Labs reviewed.  Follow up in 4 weeks.  FIRST/CF mutation testing/NIPT/QUAD SCREEN/fragile X/Ashkenazi Jewish population testing/Spinal muscular atrophy discussed: requested. Role of ultrasound in pregnancy discussed; fetal survey: requested. Amniocentesis discussed: undecided.

## 2014-05-19 ENCOUNTER — Encounter (HOSPITAL_COMMUNITY): Payer: Self-pay

## 2014-05-19 ENCOUNTER — Ambulatory Visit (HOSPITAL_COMMUNITY)
Admission: RE | Admit: 2014-05-19 | Discharge: 2014-05-19 | Disposition: A | Discharge status: Home / Self Care | Payer: Medicaid Other | Source: Ambulatory Visit | Attending: Obstetrics | Admitting: Obstetrics

## 2014-05-19 DIAGNOSIS — Z315 Encounter for genetic counseling: Secondary | ICD-10-CM | POA: Insufficient documentation

## 2014-05-19 DIAGNOSIS — Z3A15 15 weeks gestation of pregnancy: Secondary | ICD-10-CM | POA: Insufficient documentation

## 2014-05-19 DIAGNOSIS — O09522 Supervision of elderly multigravida, second trimester: Secondary | ICD-10-CM | POA: Diagnosis not present

## 2014-05-19 NOTE — Progress Notes (Signed)
Genetic Counseling  High-Risk Gestation Note  Appointment Date:  05/19/2014 Referred By: Shelly Bombard, MD Date of Birth:  02-28-1981   Pregnancy History: [id number] Estimated Date of Delivery: 11/10/14 Estimated Gestational Age: [redacted]w[redacted]d   Ms. [name] was seen for genetic counseling because of a maternal age of 33 y.o..     She was counseled regarding maternal age and the association with risk for chromosome conditions due to nondisjunction with aging of the ova.  We reviewed chromosomes, nondisjunction, and the associated 1 in 141 risk for fetal aneuploidy related to a maternal age of 33 y.o. at [redacted]w[redacted]d gestation.  She was counseled that the risk for aneuploidy decreases as gestational age increases, accounting for those pregnancies which spontaneously abort.  We specifically discussed Down syndrome (trisomy 63), trisomies 58 and 64, and sex chromosome aneuploidies (47,XXX and 47,XXY) including the common features and prognoses of each.   We reviewed available screening options including Quad screen, noninvasive prenatal screening (NIPS)/cell free DNA (cfDNA) testing, and detailed ultrasound.  She was counseled that screening tests are used to modify a patient's a priori risk for aneuploidy, typically based on age. This estimate provides a pregnancy specific risk assessment. We reviewed the benefits and limitations of each option. Specifically, we discussed the conditions for which each test screens, the detection rates, and false positive rates of each. She was also counseled regarding diagnostic testing via [facility] and amniocentesis. We reviewed the approximate 1 in 741-638 risk for complications for amniocentesis, including spontaneous pregnancy loss. After consideration of all the options, she elected to proceed with NIPS (Panorama) at the time of today's visit.  Those results will be available in 8-10 days.  She declined amniocentesis at this time but may consider amniocentesis pending results of  NIPS.   The patient would like to return for a detailed ultrasound at ~18+ weeks gestation.  This appointment is scheduled for 06/07/14. She understands that screening tests cannot rule out all birth defects or genetic syndromes. The patient was advised of this limitation and states she still does not want additional testing at this time.   Ms. [name] was provided with written information regarding sickle cell anemia (SCA) including the carrier frequency and incidence in the African-American population, the availability of carrier testing and prenatal diagnosis if indicated.  In addition, we discussed that hemoglobinopathies are routinely screened for as part of the [place] newborn screening panel.   Hemoglobin electrophoresis was previously performed through her OB office and indicated the presence of normal adult hemoglobin.  Both family histories were reviewed and found to be noncontributory for birth defects, intellectual disability, and known genetic conditions. Without further information regarding the provided family history, an accurate genetic risk cannot be calculated. Further genetic counseling is warranted if more information is obtained.   Ms. [name] denied exposure to environmental toxins or chemical agents. She denied the use of alcohol or street drugs. She reported that she discontinued smoking once becoming aware of the pregnancy. She denied significant viral illnesses during the course of her pregnancy. Her medical and surgical histories were noncontributory.   I counseled Ms. [name] regarding the above risks and available options.  The approximate face-to-face time with the genetic counselor was 45 minutes.  Chipper Oman, MS,  Certified Genetic Counselor 05/19/2014

## 2014-05-24 ENCOUNTER — Other Ambulatory Visit: Payer: Self-pay | Admitting: *Deleted

## 2014-05-24 DIAGNOSIS — E559 Vitamin D deficiency, unspecified: Secondary | ICD-10-CM

## 2014-05-24 MED ORDER — OB COMPLETE PETITE 35-5-1-200 MG PO CAPS: 1.0000 | ORAL_CAPSULE | Freq: Every day | ORAL | Status: DC

## 2014-05-24 NOTE — Progress Notes (Signed)
Patient called requesting a prescription for OB Complete Petite. Prescription sent to the pharmacy.  Patient also asked how long it take Vitamin D levels to build back up in your system. Patient advised it take approximately 3 months.  Patient verbalized understanding.

## 2014-05-26 ENCOUNTER — Other Ambulatory Visit (HOSPITAL_COMMUNITY): Payer: Self-pay

## 2014-05-26 ENCOUNTER — Telehealth (HOSPITAL_COMMUNITY): Payer: Self-pay | Admitting: MS"

## 2014-05-26 NOTE — Telephone Encounter (Signed)
[facility] Genetic Counseling Intern, Santiago Bumpers, called [name] to discuss her cell free fetal DNA test results.  Ms. ASHARIA LOTTER had Panorama testing through [facility]. Testing was offered because of maternal age.  The patient was identified by name and DOB.  We reviewed that these are within normal limits, showing a less than 1 in 10,000 risk for trisomies 21, 18 and 13, and monosomy X (Turner syndrome).  In addition, the risk for triploidy/vanishing twin and sex chromosome trisomies (47,XXX and 47,XXY) was also low risk.  We reviewed that this testing identifies > 99% of pregnancies with trisomy 71, trisomy 55, sex chromosome trisomies (47,XXX and 47,XXY), and triploidy. The detection rate for trisomy 18 is 96%.  The detection rate for monosomy X is ~92%.  The false positive rate is <0.1% for all conditions. Testing was also consistent with female fetal sex.  The patient did wish to know fetal sex.  She understands that this testing does not identify all genetic conditions.  All questions were answered to her satisfaction, she was encouraged to call with additional questions or concerns.  Chipper Oman, MS Certified Genetic Counselor 05/26/2014 3:10 PM

## 2014-05-31 ENCOUNTER — Telehealth: Payer: Self-pay | Admitting: *Deleted

## 2014-05-31 NOTE — Telephone Encounter (Signed)
Patient advised the need for her appointment on Friday is because she is having trouble sleeping and is requesting a sleep aid. Patient verbalized understanding.

## 2014-06-02 ENCOUNTER — Other Ambulatory Visit: Payer: Self-pay | Admitting: Obstetrics

## 2014-06-02 ENCOUNTER — Encounter: Payer: Self-pay | Admitting: Obstetrics

## 2014-06-02 ENCOUNTER — Ambulatory Visit: Payer: Medicaid Other | Admitting: Obstetrics

## 2014-06-02 ENCOUNTER — Ambulatory Visit (INDEPENDENT_AMBULATORY_CARE_PROVIDER_SITE_OTHER): Payer: Medicaid Other | Admitting: Obstetrics

## 2014-06-02 VITALS — BP 124/70 | HR 86 | Wt 196.0 lb

## 2014-06-02 DIAGNOSIS — G4700 Insomnia, unspecified: Secondary | ICD-10-CM

## 2014-06-02 DIAGNOSIS — G4459 Other complicated headache syndrome: Secondary | ICD-10-CM | POA: Insufficient documentation

## 2014-06-02 DIAGNOSIS — R112 Nausea with vomiting, unspecified: Secondary | ICD-10-CM | POA: Insufficient documentation

## 2014-06-02 DIAGNOSIS — O219 Vomiting of pregnancy, unspecified: Secondary | ICD-10-CM | POA: Insufficient documentation

## 2014-06-02 MED ORDER — ZOLPIDEM TARTRATE 10 MG PO TABS: 10.0000 mg | ORAL_TABLET | Freq: Every evening | ORAL | Status: DC | PRN

## 2014-06-02 MED ORDER — METOCLOPRAMIDE HCL 5 MG PO TABS: 5.0000 mg | ORAL_TABLET | Freq: Three times a day (TID) | ORAL | Status: DC

## 2014-06-02 MED ORDER — BUTALBITAL-APAP-CAFFEINE 50-325-40 MG PO TABS: 1.0000 | ORAL_TABLET | Freq: Four times a day (QID) | ORAL | Status: DC | PRN

## 2014-06-02 NOTE — Progress Notes (Signed)
Patient ID: [name], female   DOB: [birth year], 33 y.o.   MRN: [id number]  No chief complaint on file.   HPI [name] is a 33 y.o. female.  Can't sleep.  HA.  N/V.  HPI  Past Medical History  Diagnosis Date  . Depression     never been treated , "probably got some "  . Anxiety   . YQMVHQIO(962.9)     Past Surgical History  Procedure Laterality Date  . Dilation and curettage of uterus    . Therapeutic abortion    . Cesarean section    . Cholecystectomy, laparoscopic      Family History  Problem Relation Age of Onset  . Diabetes Father   . Hypertension Father   . Cancer Maternal Aunt     breast    Social History History  Substance Use Topics  . Smoking status: Former Smoker    Types: Cigarettes  . Smokeless tobacco: Former Systems developer    Quit date: 04/04/2014     Comment: prior to preg  . Alcohol Use: No    Allergies  Allergen Reactions  . Levofloxacin Itching    Current Outpatient Prescriptions  Medication Sig Dispense Refill  . omeprazole (PRILOSEC) 20 MG capsule Take 1 capsule (20 mg total) by mouth 2 (two) times daily before a meal. 60 capsule 5  . Prenat-FeCbn-FeAspGl-FA-Omega (OB COMPLETE PETITE) 35-5-1-200 MG CAPS Take 1 capsule by mouth daily. 30 capsule 11  . pseudoephedrine (SUDAFED) 30 MG tablet Take 60 mg by mouth as needed (sinus headaches).    . butalbital-acetaminophen-caffeine (FIORICET) 50-325-40 MG per tablet Take 1-2 tablets by mouth every 6 (six) hours as needed for headache. 40 tablet 2  . docusate sodium (COLACE) 100 MG capsule Take 1 capsule (100 mg total) by mouth 2 (two) times daily. 60 capsule 11  . metoCLOPramide (REGLAN) 5 MG tablet Take 1 tablet (5 mg total) by mouth 3 (three) times daily before meals. 90 tablet 2  . Prenat-FeFmCb-DSS-FA-DHA w/o A (CITRANATAL HARMONY) 27-1-260 MG CAPS Take 1 capsule by mouth daily before breakfast. 90 capsule 3  . zolpidem (AMBIEN) 10 MG tablet Take 1 tablet (10 mg total) by mouth at bedtime as needed  for sleep. 30 tablet 3   No current facility-administered medications for this visit.    Review of Systems Review of Systems Constitutional: negative for fatigue and weight loss Respiratory: negative for cough and wheezing Cardiovascular: negative for chest pain, fatigue and palpitations Gastrointestinal: negative for abdominal pain and change in bowel habits Genitourinary:negative Integument/breast: negative for nipple discharge Musculoskeletal:negative for myalgias Neurological: negative for gait problems and tremors Behavioral/Psych: negative for abusive relationship, depression Endocrine: negative for temperature intolerance     Blood pressure 124/70, pulse 86, weight 196 lb (88.905 kg), last menstrual period 02/03/2014.  Physical Exam Physical Exam:  Deferred  100% of 10 min visit spent on counseling and coordination of care.   Data Reviewed labs  Assessment    Insomnia HA N/V     Plan  Ambien Rx Fioricet Boost nutritional supplement recommended   No orders of the defined types were placed in this encounter.   Meds ordered this encounter  Medications  . butalbital-acetaminophen-caffeine (FIORICET) 50-325-40 MG per tablet    Sig: Take 1-2 tablets by mouth every 6 (six) hours as needed for headache.    Dispense:  40 tablet    Refill:  2  . zolpidem (AMBIEN) 10 MG tablet    Sig: Take 1 tablet (10  mg total) by mouth at bedtime as needed for sleep.    Dispense:  30 tablet    Refill:  3  . metoCLOPramide (REGLAN) 5 MG tablet    Sig: Take 1 tablet (5 mg total) by mouth 3 (three) times daily before meals.    Dispense:  90 tablet    Refill:  2       Phung Kotas A 06/02/2014, 2:51 PM

## 2014-06-02 NOTE — Patient Instructions (Addendum)
Patient ID: [name], female   DOB: [birth year], 33 y.o.   MRN: [id number]  No chief complaint on file.   HPI [name] is a 33 y.o. female.  Can't sleep.  N/V. @SFHPI @  Past Medical History  Diagnosis Date  . Depression     never been treated , "probably got some "  . Anxiety   . KDTOIZTI(458.0)     Past Surgical History  Procedure Laterality Date  . Dilation and curettage of uterus    . Therapeutic abortion    . Cesarean section    . Cholecystectomy, laparoscopic      Family History  Problem Relation Age of Onset  . Diabetes Father   . Hypertension Father   . Cancer Maternal Aunt     breast    Social History History  Substance Use Topics  . Smoking status: Former Smoker    Types: Cigarettes  . Smokeless tobacco: Former Systems developer    Quit date: 04/04/2014     Comment: prior to preg  . Alcohol Use: No    Allergies  Allergen Reactions  . Levofloxacin Itching    Current Outpatient Prescriptions  Medication Sig Dispense Refill  . metoCLOPramide (REGLAN) 10 MG tablet Take 1 tablet (10 mg total) by mouth 4 (four) times daily. 60 tablet 1  . omeprazole (PRILOSEC) 20 MG capsule Take 1 capsule (20 mg total) by mouth 2 (two) times daily before a meal. 60 capsule 5  . Prenat-FeCbn-FeAspGl-FA-Omega (OB COMPLETE PETITE) 35-5-1-200 MG CAPS Take 1 capsule by mouth daily. 30 capsule 11  . pseudoephedrine (SUDAFED) 30 MG tablet Take 60 mg by mouth as needed (sinus headaches).    . butalbital-acetaminophen-caffeine (FIORICET) 50-325-40 MG per tablet Take 1-2 tablets by mouth every 6 (six) hours as needed for headache. 40 tablet 2  . docusate sodium (COLACE) 100 MG capsule Take 1 capsule (100 mg total) by mouth 2 (two) times daily. 60 capsule 11  . Prenat-FeFmCb-DSS-FA-DHA w/o A (CITRANATAL HARMONY) 27-1-260 MG CAPS Take 1 capsule by mouth daily before breakfast. 90 capsule 3  . zolpidem (AMBIEN) 10 MG tablet Take 1 tablet (10 mg total) by mouth at bedtime as needed for sleep. 30  tablet 3   No current facility-administered medications for this visit.    Review of Systems @ROSBYAGE @ Constitutional: negative for fatigue and weight loss Respiratory: negative for cough and wheezing Cardiovascular: negative for chest pain, fatigue and palpitations Gastrointestinal: negative for abdominal pain and change in bowel habits Genitourinary:negative Integument/breast: negative for nipple discharge Musculoskeletal:negative for myalgias Neurological: negative for gait problems and tremors Behavioral/Psych: negative for abusive relationship, depression Endocrine: negative for temperature intolerance     Blood pressure 124/70, pulse 86, weight 196 lb (88.905 kg), last menstrual period 02/03/2014.  Physical Exam 100% of 10 min visit spent on counseling and coordination of care.   Data Reviewed labs  Assessment    Insomnia N/V in pregnancy HA @ASSESSMENTEND @  Plan    Ambien Rx Fioricet Rx Boost supplement recommended for extra calories.  No orders of the defined types were placed in this encounter.   Meds ordered this encounter  Medications  . butalbital-acetaminophen-caffeine (FIORICET) 50-325-40 MG per tablet    Sig: Take 1-2 tablets by mouth every 6 (six) hours as needed for headache.    Dispense:  40 tablet    Refill:  2  . zolpidem (AMBIEN) 10 MG tablet    Sig: Take 1 tablet (10 mg total) by mouth at bedtime as needed  for sleep.    Dispense:  30 tablet    Refill:  3   @PLANEND @     HARPER,CHARLES A 06/02/2014, 2:38 PM   Hyperemesis Gravidarum Hyperemesis gravidarum is a severe form of nausea and vomiting that happens during pregnancy. Hyperemesis is worse than morning sickness. It may cause you to have nausea or vomiting all day for many days. It may keep you from eating and drinking enough food and liquids. Hyperemesis usually occurs during the first half (the first 20 weeks) of pregnancy. It often goes away once a woman is in her second half  of pregnancy. However, sometimes hyperemesis continues through an entire pregnancy.  CAUSES  The cause of this condition is not completely known but is thought to be related to changes in the body's hormones when pregnant. It could be from the high level of the pregnancy hormone or an increase in estrogen in the body.  SIGNS AND SYMPTOMS   Severe nausea and vomiting.  Nausea that does not go away.  Vomiting that does not allow you to keep any food down.  Weight loss and body fluid loss (dehydration).  Having no desire to eat or not liking food you have previously enjoyed. DIAGNOSIS  Your health care provider will do a physical exam and ask you about your symptoms. He or she may also order blood tests and urine tests to make sure something else is not causing the problem.  TREATMENT  You may only need medicine to control the problem. If medicines do not control the nausea and vomiting, you will be treated in the hospital to prevent dehydration, increased acid in the blood (acidosis), weight loss, and changes in the electrolytes in your body that may harm the unborn baby (fetus). You may need IV fluids.  HOME CARE INSTRUCTIONS   Only take over-the-counter or prescription medicines as directed by your health care provider.  Try eating a couple of dry crackers or toast in the morning before getting out of bed.  Avoid foods and smells that upset your stomach.  Avoid fatty and spicy foods.  Eat 5-6 small meals a day.  Do not drink when eating meals. Drink between meals.  For snacks, eat high-protein foods, such as cheese.  Eat or suck on things that have ginger in them. Ginger helps nausea.  Avoid food preparation. The smell of food can spoil your appetite.  Avoid iron pills and iron in your multivitamins until after 3-4 months of being pregnant. However, consult with your health care provider before stopping any prescribed iron pills. SEEK MEDICAL CARE IF:   Your abdominal pain  increases.  You have a severe headache.  You have vision problems.  You are losing weight. SEEK IMMEDIATE MEDICAL CARE IF:   You are unable to keep fluids down.  You vomit blood.  You have constant nausea and vomiting.  You have excessive weakness.  You have extreme thirst.  You have dizziness or fainting.  You have a fever or persistent symptoms for more than 2-3 days.  You have a fever and your symptoms suddenly get worse. MAKE SURE YOU:   Understand these instructions.  Will watch your condition.  Will get help right away if you are not doing well or get worse. Document Released: 06/30/2005 Document Revised: 04/20/2013 Document Reviewed: 02/09/2013 [facility] Patient Information 2015 [place], [place]. This information is not intended to replace advice given to you by your health care provider. Make sure you discuss any questions you have with your  health care provider.  

## 2014-06-07 ENCOUNTER — Other Ambulatory Visit: Payer: Self-pay | Admitting: Obstetrics

## 2014-06-07 ENCOUNTER — Encounter (HOSPITAL_COMMUNITY): Payer: Self-pay

## 2014-06-07 ENCOUNTER — Ambulatory Visit (HOSPITAL_COMMUNITY)
Admission: RE | Admit: 2014-06-07 | Discharge: 2014-06-07 | Disposition: A | Discharge status: Home / Self Care | Payer: Medicaid Other | Source: Ambulatory Visit | Attending: Obstetrics | Admitting: Obstetrics

## 2014-06-07 DIAGNOSIS — O09522 Supervision of elderly multigravida, second trimester: Secondary | ICD-10-CM

## 2014-06-07 DIAGNOSIS — Z98891 History of uterine scar from previous surgery: Secondary | ICD-10-CM

## 2014-06-07 DIAGNOSIS — Z3A00 Weeks of gestation of pregnancy not specified: Secondary | ICD-10-CM | POA: Insufficient documentation

## 2014-06-07 DIAGNOSIS — O09521 Supervision of elderly multigravida, first trimester: Secondary | ICD-10-CM

## 2014-06-07 DIAGNOSIS — Z3A17 17 weeks gestation of pregnancy: Secondary | ICD-10-CM | POA: Insufficient documentation

## 2014-06-07 DIAGNOSIS — O283 Abnormal ultrasonic finding on antenatal screening of mother: Secondary | ICD-10-CM | POA: Insufficient documentation

## 2014-06-09 LAB — CMV IGM: CMV IGM: 24.3 [AU]/ml (ref <30.00)

## 2014-06-09 LAB — CMV ANTIBODY, IGG (EIA): CMV Ab - IgG: >10 U/mL — ABNORMAL HIGH (ref <0.60)

## 2014-06-09 LAB — TOXOPLASMA ANTIBODIES- IGG AND  IGM: Toxoplasma Antibody- IgM: <3 IU/mL (ref <8.0)

## 2014-06-13 ENCOUNTER — Encounter: Payer: Self-pay | Admitting: Obstetrics

## 2014-06-13 ENCOUNTER — Telehealth (HOSPITAL_COMMUNITY): Payer: Self-pay | Admitting: *Deleted

## 2014-06-13 ENCOUNTER — Telehealth: Payer: Self-pay | Admitting: Obstetrics

## 2014-06-13 ENCOUNTER — Ambulatory Visit (INDEPENDENT_AMBULATORY_CARE_PROVIDER_SITE_OTHER): Payer: Medicaid Other | Admitting: Obstetrics

## 2014-06-13 VITALS — BP 134/77 | HR 91 | Temp 98.8°F | Wt 197.0 lb

## 2014-06-13 DIAGNOSIS — O09522 Supervision of elderly multigravida, second trimester: Secondary | ICD-10-CM

## 2014-06-13 DIAGNOSIS — Z3482 Encounter for supervision of other normal pregnancy, second trimester: Secondary | ICD-10-CM

## 2014-06-13 LAB — POCT URINALYSIS DIPSTICK
Bilirubin, UA: NEGATIVE
Blood, UA: NEGATIVE
GLUCOSE UA: NEGATIVE
LEUKOCYTES UA: NEGATIVE
Nitrite, UA: NEGATIVE
PROTEIN UA: NEGATIVE
Spec Grav, UA: 1.02
UROBILINOGEN UA: NEGATIVE
pH, UA: 5

## 2014-06-13 NOTE — Progress Notes (Signed)
Subjective:    [name] is a 33 y.o. female being seen today for her obstetrical visit. She is at [redacted]w[redacted]d gestation. Patient reports: no complaints . Fetal movement: normal.  Problem List Items Addressed This Visit    None    Visit Diagnoses    Encounter for supervision of other normal pregnancy in second trimester    -  Primary    Relevant Orders       POCT urinalysis dipstick (Completed)      Patient Active Problem List   Diagnosis Date Noted  . Echogenic bowel of fetus on prenatal ultrasound   . [redacted] weeks gestation of pregnancy   . Nausea and vomiting in pregnancy prior to [redacted] weeks gestation 06/02/2014  . Insomnia 06/02/2014  . Other complicated headache syndrome 06/02/2014  . Nausea with vomiting 06/02/2014  . GERD without esophagitis 05/16/2014  . AMA (advanced maternal age) multigravida 35+ 05/16/2014  . ALLERGIC RHINITIS 03/19/2007   Objective:    BP 134/77 mmHg  Pulse 91  Temp(Src) 98.8 F (37.1 C)  Wt 197 lb (89.359 kg)  LMP 02/03/2014 FHT: 150 BPM  Uterine Size: size equals dates     Assessment:    Pregnancy @ [redacted]w[redacted]d    Plan:    OBGCT: ordered.  Labs, problem list reviewed and updated 2 hr GTT planned Follow up in 4 weeks.

## 2014-06-13 NOTE — Telephone Encounter (Signed)
Called [place] with her results of CMV and Toxo titers.  Instructed patient that these are within normal limits.  We will continue to follow patient with ultrasounds. Pt verbalized understanding.

## 2014-06-13 NOTE — Telephone Encounter (Signed)
H/O preeclampsia.  Taking prophylactic Baby ASA.

## 2014-07-05 ENCOUNTER — Ambulatory Visit (HOSPITAL_COMMUNITY)
Admission: RE | Admit: 2014-07-05 | Discharge: 2014-07-05 | Disposition: A | Discharge status: Home / Self Care | Payer: Medicaid Other | Source: Ambulatory Visit | Attending: Obstetrics | Admitting: Obstetrics

## 2014-07-05 ENCOUNTER — Encounter (HOSPITAL_COMMUNITY): Payer: Self-pay

## 2014-07-05 DIAGNOSIS — O358XX0 Maternal care for other (suspected) fetal abnormality and damage, not applicable or unspecified: Secondary | ICD-10-CM | POA: Insufficient documentation

## 2014-07-05 DIAGNOSIS — O35DXX0 Maternal care for other (suspected) fetal abnormality and damage, fetal gastrointestinal anomalies, not applicable or unspecified: Secondary | ICD-10-CM | POA: Insufficient documentation

## 2014-07-05 DIAGNOSIS — Z0489 Encounter for examination and observation for other specified reasons: Secondary | ICD-10-CM | POA: Insufficient documentation

## 2014-07-05 DIAGNOSIS — Z9889 Other specified postprocedural states: Secondary | ICD-10-CM | POA: Insufficient documentation

## 2014-07-05 DIAGNOSIS — O09522 Supervision of elderly multigravida, second trimester: Secondary | ICD-10-CM

## 2014-07-05 DIAGNOSIS — Z98891 History of uterine scar from previous surgery: Secondary | ICD-10-CM

## 2014-07-05 DIAGNOSIS — Z3A21 21 weeks gestation of pregnancy: Secondary | ICD-10-CM | POA: Insufficient documentation

## 2014-07-05 DIAGNOSIS — IMO0002 Reserved for concepts with insufficient information to code with codable children: Secondary | ICD-10-CM | POA: Insufficient documentation

## 2014-07-11 ENCOUNTER — Ambulatory Visit (INDEPENDENT_AMBULATORY_CARE_PROVIDER_SITE_OTHER): Payer: Medicaid Other | Admitting: Obstetrics

## 2014-07-11 VITALS — BP 117/69 | HR 86 | Temp 98.6°F | Wt 198.0 lb

## 2014-07-11 DIAGNOSIS — G4700 Insomnia, unspecified: Secondary | ICD-10-CM

## 2014-07-11 DIAGNOSIS — Z3482 Encounter for supervision of other normal pregnancy, second trimester: Secondary | ICD-10-CM

## 2014-07-11 DIAGNOSIS — G4459 Other complicated headache syndrome: Secondary | ICD-10-CM

## 2014-07-11 DIAGNOSIS — J321 Chronic frontal sinusitis: Secondary | ICD-10-CM

## 2014-07-11 MED ORDER — ZOLPIDEM TARTRATE 10 MG PO TABS: 10.0000 mg | ORAL_TABLET | Freq: Every evening | ORAL | Status: DC | PRN

## 2014-07-11 MED ORDER — BUTALBITAL-APAP-CAFFEINE 50-325-40 MG PO TABS: 1.0000 | ORAL_TABLET | Freq: Four times a day (QID) | ORAL | Status: DC | PRN

## 2014-07-11 MED ORDER — AZITHROMYCIN 250 MG PO TABS: ORAL_TABLET | ORAL | Status: DC

## 2014-07-12 ENCOUNTER — Encounter: Payer: Self-pay | Admitting: Obstetrics

## 2014-07-12 ENCOUNTER — Other Ambulatory Visit: Payer: Self-pay | Admitting: Obstetrics

## 2014-07-12 DIAGNOSIS — J321 Chronic frontal sinusitis: Secondary | ICD-10-CM | POA: Insufficient documentation

## 2014-07-12 DIAGNOSIS — G4459 Other complicated headache syndrome: Secondary | ICD-10-CM

## 2014-07-12 MED ORDER — AZITHROMYCIN 250 MG PO TABS: ORAL_TABLET | ORAL | Status: DC

## 2014-07-12 MED ORDER — BUTALBITAL-APAP-CAFFEINE 50-325-40 MG PO TABS: 1.0000 | ORAL_TABLET | Freq: Four times a day (QID) | ORAL | Status: DC | PRN

## 2014-07-12 MED ORDER — ZOLPIDEM TARTRATE 10 MG PO TABS: 10.0000 mg | ORAL_TABLET | Freq: Every evening | ORAL | Status: DC | PRN

## 2014-07-12 NOTE — Addendum Note (Signed)
Addended by: Baltazar Najjar A on: 07/12/2014 01:43 PM   Modules accepted: Orders

## 2014-07-12 NOTE — Progress Notes (Signed)
Subjective:    [name] is a 33 y.o. female being seen today for her obstetrical visit. She is at [redacted]w[redacted]d gestation. Patient reports: Headaches and insomnia. Fetal movement: normal.  Problem List Items Addressed This Visit    Insomnia   Relevant Medications      zolpidem (AMBIEN) tablet   Other complicated headache syndrome - Primary   Relevant Medications      butalbital-acetaminophen-caffeine (FIORICET) 50-325-40 MG per tablet    Other Visit Diagnoses    Frontal sinusitis, unspecified chronicity        Relevant Medications       azithromycin (ZITHROMAX) tablet      Patient Active Problem List   Diagnosis Date Noted  . [redacted] weeks gestation of pregnancy   . Evaluate anatomy not seen on prior sonogram   . Echogenic focus of bowel of fetal affecting antepartum care of mother   . Echogenic bowel of fetus on prenatal ultrasound   . [redacted] weeks gestation of pregnancy   . Nausea and vomiting in pregnancy prior to [redacted] weeks gestation 06/02/2014  . Insomnia 06/02/2014  . Other complicated headache syndrome 06/02/2014  . Nausea with vomiting 06/02/2014  . GERD without esophagitis 05/16/2014  . AMA (advanced maternal age) multigravida 35+ 05/16/2014  . ALLERGIC RHINITIS 03/19/2007   Objective:    BP 117/69 mmHg  Pulse 86  Temp(Src) 98.6 F (37 C)  Wt 198 lb (89.812 kg)  LMP 02/03/2014 FHT: 150 BPM  Uterine Size: size equals dates     Assessment:    Pregnancy @ [redacted]w[redacted]d    Plan:    OBGCT: ordered.  Labs, problem list reviewed and updated 2 hr GTT planned Follow up in 2 weeks.

## 2014-07-17 ENCOUNTER — Telehealth: Payer: Self-pay | Admitting: *Deleted

## 2014-07-17 NOTE — Telephone Encounter (Signed)
Patient states she is having pain in her abdomen. Patient states her abdominal is not tightening like a contraction. Patient states she does have a hernia and believes that is what is causing the pain. Patient advised to use her comfort measures. Patient states she has pain when walking sometimes and is concerned about continuing to work. Patient would like Dr. Jodi Mourning to advise her on what she should do.

## 2014-07-19 NOTE — Telephone Encounter (Signed)
Continue comfort measures...

## 2014-07-20 NOTE — Telephone Encounter (Signed)
Patient advised to continue comfort measures. Patient verbalized understanding.

## 2014-07-25 ENCOUNTER — Telehealth: Payer: Self-pay | Admitting: *Deleted

## 2014-07-25 ENCOUNTER — Other Ambulatory Visit: Payer: Medicaid Other

## 2014-07-25 ENCOUNTER — Ambulatory Visit (INDEPENDENT_AMBULATORY_CARE_PROVIDER_SITE_OTHER): Payer: Medicaid Other | Admitting: Obstetrics

## 2014-07-25 VITALS — BP 117/69 | HR 81 | Temp 98.7°F | Wt 202.0 lb

## 2014-07-25 DIAGNOSIS — Z3482 Encounter for supervision of other normal pregnancy, second trimester: Secondary | ICD-10-CM

## 2014-07-25 DIAGNOSIS — Z3490 Encounter for supervision of normal pregnancy, unspecified, unspecified trimester: Secondary | ICD-10-CM

## 2014-07-25 DIAGNOSIS — R52 Pain, unspecified: Secondary | ICD-10-CM

## 2014-07-25 LAB — POCT URINALYSIS DIPSTICK
Bilirubin, UA: NEGATIVE
Glucose, UA: NEGATIVE
KETONES UA: NEGATIVE
Leukocytes, UA: NEGATIVE
Nitrite, UA: NEGATIVE
PH UA: 5
PROTEIN UA: NEGATIVE
RBC UA: NEGATIVE
Spec Grav, UA: 1.02
UROBILINOGEN UA: NEGATIVE

## 2014-07-25 LAB — CBC
HEMATOCRIT: 30.8 % — AB (ref 36.0–46.0)
Hemoglobin: 10.5 g/dL — ABNORMAL LOW (ref 12.0–15.0)
MCH: 33.3 pg (ref 26.0–34.0)
MCHC: 34.1 g/dL (ref 30.0–36.0)
MCV: 97.8 fL (ref 78.0–100.0)
MPV: 11.4 fL (ref 8.6–12.4)
PLATELETS: 223 10*3/uL (ref 150–400)
RBC: 3.15 MIL/uL — AB (ref 3.87–5.11)
RDW: 14.1 % (ref 11.5–15.5)
WBC: 9.7 10*3/uL (ref 4.0–10.5)

## 2014-07-25 MED ORDER — TRAMADOL HCL 50 MG PO TABS: 50.0000 mg | ORAL_TABLET | Freq: Four times a day (QID) | ORAL | Status: DC | PRN

## 2014-07-25 NOTE — Progress Notes (Signed)
Patient states she is having increasing abdomen pain. Patient states she does have a hernia. Patient states had times it becomes difficult for her to walk. Patient states she is using her comfort measures with little relief.

## 2014-07-25 NOTE — Telephone Encounter (Signed)
Patient wanted to remind Dr. Jodi Mourning about scheduling an ultrasound when she was [redacted] weeks pregnant.  Per Dr. Jodi Mourning patient to remind him at her next visit.  Attempted to contact the patient and left message for patient to contact the office.

## 2014-07-26 ENCOUNTER — Encounter: Payer: Self-pay | Admitting: Obstetrics

## 2014-07-26 LAB — GLUCOSE TOLERANCE, 2 HOURS W/ 1HR
Glucose, 1 hour: 140 mg/dL (ref 70–170)
Glucose, 2 hour: 94 mg/dL (ref 70–139)
Glucose, Fasting: 77 mg/dL (ref 70–99)

## 2014-07-26 LAB — RPR

## 2014-07-26 LAB — HIV ANTIBODY (ROUTINE TESTING W REFLEX): HIV 1&2 Ab, 4th Generation: NONREACTIVE

## 2014-07-26 NOTE — Progress Notes (Signed)
Subjective:    [name] is a 34 y.o. female being seen today for her obstetrical visit. She is at [redacted]w[redacted]d gestation. Patient reports: no complaints . Fetal movement: normal.  Problem List Items Addressed This Visit    None    Visit Diagnoses    Encounter for supervision of other normal pregnancy in second trimester    -  Primary    Relevant Orders    Glucose Tolerance, 2 Hours w/1 Hour    CBC    HIV antibody    RPR    POCT urinalysis dipstick (Completed)    Pain aggravated by activities of daily living        Relevant Medications    Tramadol HCL (ULTRAM) 50 mg po tab      Patient Active Problem List   Diagnosis Date Noted  . Frontal sinusitis 07/12/2014  . [redacted] weeks gestation of pregnancy   . Evaluate anatomy not seen on prior sonogram   . Echogenic focus of bowel of fetal affecting antepartum care of mother   . Echogenic bowel of fetus on prenatal ultrasound   . [redacted] weeks gestation of pregnancy   . Nausea and vomiting in pregnancy prior to [redacted] weeks gestation 06/02/2014  . Insomnia 06/02/2014  . Other complicated headache syndrome 06/02/2014  . Nausea with vomiting 06/02/2014  . GERD without esophagitis 05/16/2014  . AMA (advanced maternal age) multigravida 35+ 05/16/2014  . ALLERGIC RHINITIS 03/19/2007   Objective:    BP 117/69 mmHg  Pulse 81  Temp(Src) 98.7 F (37.1 C)  Wt 202 lb (91.627 kg)  LMP 02/03/2014 FHT: 150 BPM  Uterine Size: size equals dates     Assessment:    Pregnancy @ [redacted]w[redacted]d    Plan:    OBGCT: ordered.  Labs, problem list reviewed and updated 2 hr GTT planned Follow up in 2 weeks.

## 2014-07-31 NOTE — Telephone Encounter (Signed)
Attempted to contact patient and left a message notifying patient to remind Dr. Jodi Mourning about the ultrasound at her next visit.

## 2014-08-01 ENCOUNTER — Telehealth: Payer: Self-pay | Admitting: *Deleted

## 2014-08-01 NOTE — Telephone Encounter (Signed)
Patient states she has a bad head cold and would like to know what she can take. Patient states she is also having a little swelling in her ankles. Patient advised of safe OTC meds for head cold. Advised patient to put her feet up and to increase her warer intake to help with th swelling. Patient advised to contact the office if her symptoms do not improve.

## 2014-08-03 ENCOUNTER — Telehealth: Payer: Self-pay | Admitting: *Deleted

## 2014-08-03 ENCOUNTER — Encounter: Payer: Self-pay | Admitting: *Deleted

## 2014-08-03 DIAGNOSIS — O99342 Other mental disorders complicating pregnancy, second trimester: Principal | ICD-10-CM

## 2014-08-03 DIAGNOSIS — F329 Major depressive disorder, single episode, unspecified: Secondary | ICD-10-CM

## 2014-08-03 DIAGNOSIS — O219 Vomiting of pregnancy, unspecified: Secondary | ICD-10-CM

## 2014-08-03 DIAGNOSIS — F32A Depression, unspecified: Secondary | ICD-10-CM

## 2014-08-03 MED ORDER — ONDANSETRON HCL 8 MG PO TABS: 8.0000 mg | ORAL_TABLET | Freq: Three times a day (TID) | ORAL | Status: DC | PRN

## 2014-08-03 MED ORDER — SERTRALINE HCL 50 MG PO TABS: 50.0000 mg | ORAL_TABLET | ORAL | Status: DC

## 2014-08-03 NOTE — Telephone Encounter (Signed)
Patient called the office asking if she could take Mucinex DM for her congestion that she has with her head cold. Patient advised it was okay to take. Patient then asked if it was okay to take an anti-depressant while pregnant. Patient advised in some cases it is okay. Patient states she is going through a lot right now and feels like she needs something to help her get through it. Patient advised that [facility] is in our office twice a week if she would like to set up some time to talk with them. Patient states she has missed some time at work, 1-19, half a day yesterday and today. Patient requesting a letter for work.   Per Dr. Jodi Mourning: Start patient on Zoloft 50 mg one tablet q AM. Also discontinue Reglan. Start Zofran 8 mg po q 8 hours for nausea and vomiting.   Patient advised prescriptions sent to the pharmacy and that we would write her letter for work. Patient verbalized understanding.

## 2014-08-08 ENCOUNTER — Encounter: Payer: Medicaid Other | Admitting: Obstetrics

## 2014-08-08 ENCOUNTER — Ambulatory Visit (INDEPENDENT_AMBULATORY_CARE_PROVIDER_SITE_OTHER): Payer: Medicaid Other | Admitting: Obstetrics

## 2014-08-08 VITALS — BP 121/72 | HR 84 | Temp 98.7°F | Wt 203.0 lb

## 2014-08-08 DIAGNOSIS — Z3482 Encounter for supervision of other normal pregnancy, second trimester: Secondary | ICD-10-CM

## 2014-08-08 LAB — POCT URINALYSIS DIPSTICK
BILIRUBIN UA: NEGATIVE
Glucose, UA: NEGATIVE
Ketones, UA: NEGATIVE
Leukocytes, UA: NEGATIVE
Nitrite, UA: NEGATIVE
RBC UA: NEGATIVE
SPEC GRAV UA: 1.02
Urobilinogen, UA: NEGATIVE
pH, UA: 6

## 2014-08-09 ENCOUNTER — Encounter: Payer: Self-pay | Admitting: *Deleted

## 2014-08-14 ENCOUNTER — Encounter: Payer: Self-pay | Admitting: Obstetrics

## 2014-08-14 NOTE — Progress Notes (Signed)
Subjective:    [name] is a 34 y.o. female being seen today for her obstetrical visit. She is at [redacted]w[redacted]d gestation. Patient reports: no complaints . Fetal movement: normal.  Problem List Items Addressed This Visit    None    Visit Diagnoses    Encounter for supervision of other normal pregnancy in second trimester    -  Primary    Relevant Orders    POCT urinalysis dipstick (Completed)      Patient Active Problem List   Diagnosis Date Noted  . Frontal sinusitis 07/12/2014  . [redacted] weeks gestation of pregnancy   . Evaluate anatomy not seen on prior sonogram   . Echogenic focus of bowel of fetal affecting antepartum care of mother   . Echogenic bowel of fetus on prenatal ultrasound   . [redacted] weeks gestation of pregnancy   . Nausea and vomiting in pregnancy prior to [redacted] weeks gestation 06/02/2014  . Insomnia 06/02/2014  . Other complicated headache syndrome 06/02/2014  . Nausea with vomiting 06/02/2014  . GERD without esophagitis 05/16/2014  . AMA (advanced maternal age) multigravida 35+ 05/16/2014  . ALLERGIC RHINITIS 03/19/2007   Objective:    BP 121/72 mmHg  Pulse 84  Temp(Src) 98.7 F (37.1 C)  Wt 203 lb (92.08 kg)  LMP 02/03/2014 FHT: 150 BPM  Uterine Size: size equals dates     Assessment:    Pregnancy @ [redacted]w[redacted]d    Plan:    OBGCT: discussed.  Labs, problem list reviewed and updated Follow up in 2 weeks.

## 2014-08-22 ENCOUNTER — Ambulatory Visit (INDEPENDENT_AMBULATORY_CARE_PROVIDER_SITE_OTHER): Payer: Medicaid Other | Admitting: Obstetrics

## 2014-08-22 ENCOUNTER — Encounter: Payer: Self-pay | Admitting: Obstetrics

## 2014-08-22 VITALS — BP 129/61 | HR 91 | Temp 98.7°F | Wt 204.0 lb

## 2014-08-22 DIAGNOSIS — O09523 Supervision of elderly multigravida, third trimester: Secondary | ICD-10-CM

## 2014-08-22 DIAGNOSIS — Z3483 Encounter for supervision of other normal pregnancy, third trimester: Secondary | ICD-10-CM

## 2014-08-22 LAB — POCT URINALYSIS DIPSTICK
Bilirubin, UA: NEGATIVE
Blood, UA: NEGATIVE
GLUCOSE UA: NEGATIVE
Ketones, UA: NEGATIVE
Leukocytes, UA: NEGATIVE
Protein, UA: NEGATIVE
Spec Grav, UA: 1.015
UROBILINOGEN UA: NEGATIVE
pH, UA: 5.5

## 2014-08-22 NOTE — Progress Notes (Signed)
Subjective:    [name] is a 34 y.o. female being seen today for her obstetrical visit. She is at [redacted]w[redacted]d gestation. Patient reports no complaints. Fetal movement: normal.  Problem List Items Addressed This Visit    AMA (advanced maternal age) multigravida 5+ - Primary   Relevant Orders   US OB Detail   AMB Referral to Maternal Fetal Medicine (MFM)    Other Visit Diagnoses    Encounter for supervision of other normal pregnancy in third trimester        Relevant Orders    POCT urinalysis dipstick    SGA (small for gestational age)        Relevant Orders    US OB Detail    AMB Referral to Maternal Fetal Medicine (MFM)      Patient Active Problem List   Diagnosis Date Noted  . Frontal sinusitis 07/12/2014  . [redacted] weeks gestation of pregnancy   . Evaluate anatomy not seen on prior sonogram   . Echogenic focus of bowel of fetal affecting antepartum care of mother   . Echogenic bowel of fetus on prenatal ultrasound   . [redacted] weeks gestation of pregnancy   . Nausea and vomiting in pregnancy prior to [redacted] weeks gestation 06/02/2014  . Insomnia 06/02/2014  . Other complicated headache syndrome 06/02/2014  . Nausea with vomiting 06/02/2014  . GERD without esophagitis 05/16/2014  . AMA (advanced maternal age) multigravida 35+ 05/16/2014  . ALLERGIC RHINITIS 03/19/2007   Objective:    BP 129/61 mmHg  Pulse 91  Temp(Src) 98.7 F (37.1 C)  Wt 204 lb (92.534 kg)  LMP 02/03/2014 FHT:  150 BPM  Uterine Size: size less than dates  Presentation: unsure     Assessment:    Pregnancy @ [redacted]w[redacted]d weeks    AMA  Size < Dates  Plan:     labs reviewed, problem list updated Consent signed. GBS sent TDAP offered  Rhogam given for RH negative Pediatrician: discussed. Infant feeding: plans to breastfeed. Maternity leave: not discussed. Cigarette smoking: former smoker. Orders Placed This Encounter  Procedures  . US OB Detail    Standing Status: Future     Number of Occurrences:    Standing Expiration Date: 10/21/2015    Order Specific Question:  Reason for Exam (SYMPTOM  OR DIAGNOSIS REQUIRED)    Answer:  SGA.  AMA.    Order Specific Question:  Preferred imaging location?    Answer:  [facility]Ultrasound  . AMB Referral to Maternal Fetal Medicine (MFM)    Referral Priority:  Routine    Referral Type:  Consultation    Number of Visits Requested:  1  . POCT urinalysis dipstick   No orders of the defined types were placed in this encounter.   Follow up in 2 Weeks.

## 2014-08-29 ENCOUNTER — Emergency Department (INDEPENDENT_AMBULATORY_CARE_PROVIDER_SITE_OTHER)
Admission: EM | Admit: 2014-08-29 | Discharge: 2014-08-29 | Disposition: A | Discharge status: Home / Self Care | Payer: Medicaid Other | Source: Home / Self Care | Attending: Family Medicine | Admitting: Family Medicine

## 2014-08-29 ENCOUNTER — Encounter (HOSPITAL_COMMUNITY): Payer: Self-pay | Admitting: Emergency Medicine

## 2014-08-29 DIAGNOSIS — L02412 Cutaneous abscess of left axilla: Secondary | ICD-10-CM

## 2014-08-29 MED ORDER — LIDOCAINE-EPINEPHRINE (PF) 2 %-1:200000 IJ SOLN
INTRAMUSCULAR | Status: AC
  Filled 2014-08-29: qty 20

## 2014-08-29 MED ORDER — HYDROCODONE-ACETAMINOPHEN 5-325 MG PO TABS
1.0000 | ORAL_TABLET | ORAL | Status: DC | PRN
Start: 2014-08-29 — End: 2014-08-29

## 2014-08-29 MED ORDER — CLINDAMYCIN HCL 300 MG PO CAPS: ORAL_CAPSULE | ORAL | Status: DC

## 2014-08-29 MED ORDER — HYDROCODONE-ACETAMINOPHEN 5-325 MG PO TABS: 1.0000 | ORAL_TABLET | ORAL | Status: DC | PRN

## 2014-08-29 NOTE — ED Provider Notes (Signed)
CSN: [id number]     Arrival date & time 08/29/14  1006 History   First MD Initiated Contact with Patient 08/29/14 1136     Chief Complaint  Patient presents with  . Abscess   (Consider location/radiation/quality/duration/timing/severity/associated sxs/prior Treatment) HPI Comments: 34 year old female complaining of increasing painful abscess to the left axilla over the past 4 days. Patient is 29 weeks' gestation.   Past Medical History  Diagnosis Date  . Depression     never been treated , "probably got some "  . Anxiety   . NMMHWKGS(811.0)    Past Surgical History  Procedure Laterality Date  . Dilation and curettage of uterus    . Therapeutic abortion    . Cesarean section    . Cholecystectomy, laparoscopic     Family History  Problem Relation Age of Onset  . Diabetes Father   . Hypertension Father   . Cancer Maternal Aunt     breast   History  Substance Use Topics  . Smoking status: Former Smoker    Types: Cigarettes  . Smokeless tobacco: Former Systems developer    Quit date: 04/04/2014     Comment: prior to preg  . Alcohol Use: No   OB History    Gravida Para Term Preterm AB TAB SAB Ectopic Multiple Living   7 2 2  0 4 3 1   2      Review of Systems  Constitutional: Negative.   Skin:       As per history of present illness  All other systems reviewed and are negative.   Allergies  Levofloxacin  Home Medications   Prior to Admission medications   Medication Sig Start Date End Date Taking? Authorizing Provider  butalbital-acetaminophen-caffeine (FIORICET) 50-325-40 MG per tablet Take 1-2 tablets by mouth every 6 (six) hours as needed for headache. 07/12/14 07/12/15 Yes Shelly Bombard, MD  omeprazole (PRILOSEC) 20 MG capsule Take 1 capsule (20 mg total) by mouth 2 (two) times daily before a meal. 05/16/14  Yes Shelly Bombard, MD  ondansetron (ZOFRAN) 8 MG tablet Take 1 tablet (8 mg total) by mouth every 8 (eight) hours as needed for nausea or vomiting. 08/03/14  Yes  Shelly Bombard, MD  Prenat-FeCbn-FeAspGl-FA-Omega (OB COMPLETE PETITE) 35-5-1-200 MG CAPS Take 1 capsule by mouth daily. 05/24/14  Yes Shelly Bombard, MD  sertraline (ZOLOFT) 50 MG tablet Take 1 tablet (50 mg total) by mouth every morning. 08/03/14  Yes Shelly Bombard, MD  traMADol (ULTRAM) 50 MG tablet Take 1-2 tablets (50-100 mg total) by mouth every 6 (six) hours as needed. 07/25/14  Yes Shelly Bombard, MD  aspirin 81 MG tablet Take 81 mg by mouth daily.    Historical Provider, MD  clindamycin (CLEOCIN) 300 MG capsule Take one tab bid for 7 days 08/29/14   Janne Napoleon, NP  HYDROcodone-acetaminophen (NORCO/VICODIN) 5-325 MG per tablet Take 1 tablet by mouth every 4 (four) hours as needed. 08/29/14   Janne Napoleon, NP  zolpidem (AMBIEN) 10 MG tablet Take 1 tablet (10 mg total) by mouth at bedtime as needed for sleep. 07/12/14 08/11/14  Shelly Bombard, MD   BP 119/89 mmHg  Pulse 97  Temp(Src) 99 F (37.2 C) (Oral)  Resp 16  SpO2 97%  LMP 02/03/2014 Physical Exam  Constitutional: She is oriented to person, place, and time. She appears well-developed and well-nourished. No distress.  Neck: Normal range of motion. Neck supple.  Cardiovascular: Normal rate.   Pulmonary/Chest: Effort normal. No respiratory  distress.  Musculoskeletal: She exhibits no edema.  Neurological: She is oriented to person, place, and time.  Skin: Skin is warm and dry.  Protruding abscess within the skin fold of the left axilla. Measuring approximately 6 cm in length and 20 cm in width. There is a central white fistula like lesion with areas a drop of pus.  Psychiatric: She has a normal mood and affect.  Nursing note and vitals reviewed.   ED Course  INCISION AND DRAINAGE Date/Time: 08/29/2014 12:09 PM Performed by: Marcha Dutton, Justise Ehmann Authorized by: Waldemar Dickens Consent: Verbal consent obtained. Risks and benefits: risks, benefits and alternatives were discussed Consent given by: patient Patient understanding:  patient states understanding of the procedure being performed Patient identity confirmed: verbally with patient Type: abscess Body area: upper extremity Location details: right arm Anesthesia: local infiltration Local anesthetic: lidocaine 2% without epinephrine Anesthetic total: 8 ml Patient sedated: no Scalpel size: 11 Incision type: single with marsupialization Complexity: complex Drainage: purulent and  bloody Drainage amount: moderate Wound treatment: drain placed Packing material: 1/4 in iodoform gauze Patient tolerance: Patient tolerated the procedure well with no immediate complications   (including critical care time) Labs Review Labs Reviewed  CULTURE, ROUTINE-ABSCESS    Imaging Review No results found.   MDM   1. Abscess of axilla, left     I and D Dressed clinda 300 bid. Consulted with pharmacist at [facility] compress notco 5 mg #15 RTO 2-3 days    Janne Napoleon, NP 08/29/14 1222

## 2014-08-29 NOTE — Discharge Instructions (Signed)
Abscess  Warm Compresses, often An abscess is an infected area that contains a collection of pus and debris.It can occur in almost any part of the body. An abscess is also known as a furuncle or boil. CAUSES  An abscess occurs when tissue gets infected. This can occur from blockage of oil or sweat glands, infection of hair follicles, or a minor injury to the skin. As the body tries to fight the infection, pus collects in the area and creates pressure under the skin. This pressure causes pain. People with weakened immune systems have difficulty fighting infections and get certain abscesses more often.  SYMPTOMS Usually an abscess develops on the skin and becomes a painful mass that is red, warm, and tender. If the abscess forms under the skin, you may feel a moveable soft area under the skin. Some abscesses break open (rupture) on their own, but most will continue to get worse without care. The infection can spread deeper into the body and eventually into the bloodstream, causing you to feel ill.  DIAGNOSIS  Your caregiver will take your medical history and perform a physical exam. A sample of fluid may also be taken from the abscess to determine what is causing your infection. TREATMENT  Your caregiver may prescribe antibiotic medicines to fight the infection. However, taking antibiotics alone usually does not cure an abscess. Your caregiver may need to make a small cut (incision) in the abscess to drain the pus. In some cases, gauze is packed into the abscess to reduce pain and to continue draining the area. HOME CARE INSTRUCTIONS   Only take over-the-counter or prescription medicines for pain, discomfort, or fever as directed by your caregiver.  If you were prescribed antibiotics, take them as directed. Finish them even if you start to feel better.  If gauze is used, follow your caregiver's directions for changing the gauze.  To avoid spreading the infection:  Keep your draining abscess  covered with a bandage.  Wash your hands well.  Do not share personal care items, towels, or whirlpools with others.  Avoid skin contact with others.  Keep your skin and clothes clean around the abscess.  Keep all follow-up appointments as directed by your caregiver. SEEK MEDICAL CARE IF:   You have increased pain, swelling, redness, fluid drainage, or bleeding.  You have muscle aches, chills, or a general ill feeling.  You have a fever. MAKE SURE YOU:   Understand these instructions.  Will watch your condition.  Will get help right away if you are not doing well or get worse. Document Released: 04/09/2005 Document Revised: 12/30/2011 Document Reviewed: 09/12/2011 [facility] Patient Information 2015 [place], [place]. This information is not intended to replace advice given to you by your health care provider. Make sure you discuss any questions you have with your health care provider.  Abscess Care After An abscess (also called a boil or furuncle) is an infected area that contains a collection of pus. Signs and symptoms of an abscess include pain, tenderness, redness, or hardness, or you may feel a moveable soft area under your skin. An abscess can occur anywhere in the body. The infection may spread to surrounding tissues causing cellulitis. A cut (incision) by the surgeon was made over your abscess and the pus was drained out. Gauze may have been packed into the space to provide a drain that will allow the cavity to heal from the inside outwards. The boil may be painful for 5 to 7 days. Most people with a  boil do not have high fevers. Your abscess, if seen early, may not have localized, and may not have been lanced. If not, another appointment may be required for this if it does not get better on its own or with medications. HOME CARE INSTRUCTIONS   Only take over-the-counter or prescription medicines for pain, discomfort, or fever as directed by your caregiver.  When you bathe,  soak and then remove gauze or iodoform packs at least daily or as directed by your caregiver. You may then wash the wound gently with mild soapy water. Repack with gauze or do as your caregiver directs. SEEK IMMEDIATE MEDICAL CARE IF:   You develop increased pain, swelling, redness, drainage, or bleeding in the wound site.  You develop signs of generalized infection including muscle aches, chills, fever, or a general ill feeling.  An oral temperature above 102 F (38.9 C) develops, not controlled by medication. See your caregiver for a recheck if you develop any of the symptoms described above. If medications (antibiotics) were prescribed, take them as directed. Document Released: 01/16/2005 Document Revised: 09/22/2011 Document Reviewed: 09/13/2007 [facility] Patient Information 2015 [place], [place]. This information is not intended to replace advice given to you by your health care provider. Make sure you discuss any questions you have with your health care provider.

## 2014-08-29 NOTE — ED Notes (Signed)
C/o  Abscess of right axillary.  On set 2/12 and has gradually gotten worse.  No relief with otc boil treatments.   No fever.  No drainage.   Pt is [redacted] wks pregnant.

## 2014-09-01 ENCOUNTER — Encounter (HOSPITAL_COMMUNITY): Payer: Self-pay | Admitting: Emergency Medicine

## 2014-09-01 ENCOUNTER — Emergency Department (INDEPENDENT_AMBULATORY_CARE_PROVIDER_SITE_OTHER)
Admission: EM | Admit: 2014-09-01 | Discharge: 2014-09-01 | Disposition: A | Discharge status: Home / Self Care | Payer: Medicaid Other | Source: Home / Self Care | Attending: Emergency Medicine | Admitting: Emergency Medicine

## 2014-09-01 ENCOUNTER — Telehealth (HOSPITAL_COMMUNITY): Payer: Self-pay | Admitting: *Deleted

## 2014-09-01 DIAGNOSIS — Z2801 Immunization not carried out because of acute illness of patient: Secondary | ICD-10-CM

## 2014-09-01 DIAGNOSIS — L0291 Cutaneous abscess, unspecified: Secondary | ICD-10-CM

## 2014-09-01 LAB — CULTURE, ROUTINE-ABSCESS

## 2014-09-01 NOTE — ED Notes (Signed)
Here for a follow up on abscess to left axilla Pt reports she's feeling better Reports she did not get a chance to listen to Suzanne's VM; Culture pos for MRSA; needed a new antibiotic.  Alert, no signs of acute distress.

## 2014-09-01 NOTE — ED Provider Notes (Signed)
Chief Complaint   Follow-up   History of Present Illness   [name] is a 34 year old female who is [redacted] weeks pregnant. She had an I&D of an abscess in her left axilla 2 days ago. This was packed and she was placed on clindamycin because of her pregnancy. She returns today for packing removal. She states it doesn't hurt anymore and she's had no fever or chills. She's been taking the clindamycin. Culture results show MRSA which is resistant to clindamycin but sensitive to doxycycline, Septra, rifampin, and vancomycin.  Review of Systems   Other than as noted above, the patient denies any of the following symptoms: Systemic:  No fevers, chills, sweats, weight loss or gain, fatigue, or tiredness.  [place]   Past medical history, family history, social history, meds, and allergies were reviewed.    Physical Examination    Vital signs:  BP 132/76 mmHg  Pulse 92  Temp(Src) 97.9 F (36.6 C) (Oral)  Resp 18  SpO2 99%  LMP 02/03/2014 General:  Alert and oriented.  In no distress.  Skin warm and dry. Skin: There is a packed abscess in the left axilla. There is no surrounding erythema or induration. No crepitus or bulla formation.  Labs   Results for orders placed or performed during the hospital encounter of 08/29/14  Culture, routine-abscess  Result Value Ref Range   Specimen Description ABSCESS    Special Requests LEFT,AXILLA    Gram Stain      MODERATE WBC PRESENT,BOTH PMN AND MONONUCLEAR NO SQUAMOUS EPITHELIAL CELLS SEEN NO ORGANISMS SEEN Performed at [facility]    Culture      MODERATE METHICILLIN RESISTANT STAPHYLOCOCCUS AUREUS Note: RIFAMPIN AND GENTAMICIN SHOULD NOT BE USED AS SINGLE DRUGS FOR TREATMENT OF STAPH INFECTIONS. CRITICAL RESULT CALLED TO, READ BACK BY AND VERIFIED WITH: NANCY BY INGRAM A 09/01/14 Performed at [facility]    Report Status 09/01/2014 FINAL    Organism ID, Bacteria METHICILLIN RESISTANT STAPHYLOCOCCUS AUREUS    Susceptibility   Methicillin resistant staphylococcus aureus - MIC*    CLINDAMYCIN >=8 RESISTANT Resistant     ERYTHROMYCIN >=8 RESISTANT Resistant     GENTAMICIN <=0.5 SENSITIVE Sensitive     LEVOFLOXACIN 4 INTERMEDIATE Intermediate     OXACILLIN >=4 RESISTANT Resistant     PENICILLIN >=0.5 RESISTANT Resistant     RIFAMPIN <=0.5 SENSITIVE Sensitive     TRIMETH/SULFA <=10 SENSITIVE Sensitive     VANCOMYCIN 1 SENSITIVE Sensitive     TETRACYCLINE 2 SENSITIVE Sensitive     * MODERATE METHICILLIN RESISTANT STAPHYLOCOCCUS AUREUS    Course in Urgent [place]   The packing was removed. There was no purulent exudate that could be expressed from the abscess cavity and the abscess cavity appeared clean. It was washed with saline, antibiotic ointment was applied and a sterile, dry dressing. The patient was instructed in wound care.  Assessment   The encounter diagnosis was Abscess.  Since she is pregnant, there is nothing that she can take in the way of an antibiotics that would be appropriate. Tetracycline and Septra are contraindicated. Right rifampin by itself is not recommended for treatment of staph. Vancomycin can only be given intravenously in the hospital. This is getting better with just incision and drainage. I think it will improve without any antibiotics, she was told to stop the clindamycin, since it is resistant. He was told to follow-up if there any complications.  Plan   1.  Meds:  The following  meds were prescribed:   Discharge Medication List as of 09/01/2014  7:00 PM      2.  Patient Education/Counseling:  The patient was given appropriate handouts, self care instructions, and instructed in symptomatic relief.    3.  Follow up:  The patient was told to follow up here if no better in 3 to 4 days, or sooner if becoming worse in any way, and given some red flag symptoms such as fever or swelling which would prompt immediate return.        Harden Mo, MD 09/01/14  2101

## 2014-09-01 NOTE — Discharge Instructions (Signed)
Now that the packing has been removed from your abscess, you may bathe and shower as normal.  You should change the dressing at least once a day.  You may change it more often if it becomes soiled with drainage.  Wash the wound well with soap and water, pat dry, apply an antibiotic ointment (Neosporin, Polysporin, or Bacitracin are all OK), then cover with a non-stick dressing such as Telfa with several layers of plain gause over that to absorb drainage.  You may secure this in place with tape or with roll gauze and tape.  Keep the wound covered for until it stops draining.  This usually takes 7 days, but may be longer.  Return for a recheck if you have heavy bleeding, increasing pain, fever, or the wound looks worse.  ° °

## 2014-09-01 NOTE — ED Notes (Signed)
Abscess culture L axilla:  Mod. MRSA.  Pt. treated with I and D and Cleocin.  Cleocin resistant.  Lab shown to Dr. Juventino Slovak and he changed her to Minocin 100 mg. BID #[id number]-recopied from his written order.  I called pt. and left a message to call. [name] 09/01/2014

## 2014-09-05 ENCOUNTER — Ambulatory Visit (HOSPITAL_COMMUNITY)
Admission: RE | Admit: 2014-09-05 | Discharge: 2014-09-05 | Disposition: A | Discharge status: Home / Self Care | Payer: Medicaid Other | Source: Ambulatory Visit | Attending: Obstetrics | Admitting: Obstetrics

## 2014-09-05 DIAGNOSIS — Z3A30 30 weeks gestation of pregnancy: Secondary | ICD-10-CM | POA: Diagnosis not present

## 2014-09-05 DIAGNOSIS — O26843 Uterine size-date discrepancy, third trimester: Secondary | ICD-10-CM | POA: Insufficient documentation

## 2014-09-05 DIAGNOSIS — O09293 Supervision of pregnancy with other poor reproductive or obstetric history, third trimester: Secondary | ICD-10-CM | POA: Diagnosis not present

## 2014-09-05 DIAGNOSIS — O3421 Maternal care for scar from previous cesarean delivery: Secondary | ICD-10-CM | POA: Diagnosis present

## 2014-09-05 DIAGNOSIS — O09523 Supervision of elderly multigravida, third trimester: Secondary | ICD-10-CM | POA: Insufficient documentation

## 2014-09-05 DIAGNOSIS — O26849 Uterine size-date discrepancy, unspecified trimester: Secondary | ICD-10-CM | POA: Insufficient documentation

## 2014-09-05 DIAGNOSIS — O2610 Low weight gain in pregnancy, unspecified trimester: Secondary | ICD-10-CM | POA: Insufficient documentation

## 2014-09-05 NOTE — ED Notes (Signed)
I called pt. Pt. verified x 2 and given result.  Pt. asked if I could see that she came back. I told her I could now but was not looking at that note when I called her.  Pt. is unable to take the Minocin prescribed by Dr. Juventino Slovak because she is pregnant.  She said it is getting better with just the I and D. I reviewed the [facility] MRSA instructions with her.  Pt. voiced understanding. Roselyn Meier 09/05/2014

## 2014-09-06 ENCOUNTER — Encounter: Payer: Self-pay | Admitting: *Deleted

## 2014-09-06 ENCOUNTER — Ambulatory Visit (INDEPENDENT_AMBULATORY_CARE_PROVIDER_SITE_OTHER): Payer: Medicaid Other | Admitting: Obstetrics

## 2014-09-06 ENCOUNTER — Encounter: Payer: Medicaid Other | Admitting: Obstetrics

## 2014-09-06 VITALS — BP 114/72 | HR 94 | Temp 98.9°F | Wt 197.0 lb

## 2014-09-06 DIAGNOSIS — G4700 Insomnia, unspecified: Secondary | ICD-10-CM

## 2014-09-06 DIAGNOSIS — Z3483 Encounter for supervision of other normal pregnancy, third trimester: Secondary | ICD-10-CM

## 2014-09-06 DIAGNOSIS — B3731 Acute candidiasis of vulva and vagina: Secondary | ICD-10-CM

## 2014-09-06 DIAGNOSIS — L02439 Carbuncle of limb, unspecified: Secondary | ICD-10-CM

## 2014-09-06 DIAGNOSIS — B373 Candidiasis of vulva and vagina: Secondary | ICD-10-CM

## 2014-09-06 DIAGNOSIS — L02429 Furuncle of limb, unspecified: Secondary | ICD-10-CM

## 2014-09-06 DIAGNOSIS — O09523 Supervision of elderly multigravida, third trimester: Secondary | ICD-10-CM

## 2014-09-06 LAB — POCT URINALYSIS DIPSTICK
BILIRUBIN UA: NEGATIVE
Blood, UA: NEGATIVE
GLUCOSE UA: NEGATIVE
LEUKOCYTES UA: NEGATIVE
Nitrite, UA: NEGATIVE
SPEC GRAV UA: 1.015
Urobilinogen, UA: 1
pH, UA: 6

## 2014-09-06 MED ORDER — ZOLPIDEM TARTRATE 10 MG PO TABS: 10.0000 mg | ORAL_TABLET | Freq: Every evening | ORAL | Status: DC | PRN

## 2014-09-06 MED ORDER — FLUCONAZOLE 150 MG PO TABS: 150.0000 mg | ORAL_TABLET | Freq: Once | ORAL | Status: DC

## 2014-09-06 MED ORDER — SULFAMETHOXAZOLE-TRIMETHOPRIM 800-160 MG PO TABS: 1.0000 | ORAL_TABLET | Freq: Two times a day (BID) | ORAL | Status: DC

## 2014-09-06 NOTE — Progress Notes (Signed)
Subjective:    [name] is a 34 y.o. female being seen today for her obstetrical visit. She is at [redacted]w[redacted]d gestation. Patient reports no complaints. Fetal movement: normal.  Problem List Items Addressed This Visit    Insomnia   Relevant Medications   zolpidem (AMBIEN) tablet    Other Visit Diagnoses    Encounter for supervision of other normal pregnancy in third trimester    -  Primary    Relevant Orders    POCT urinalysis dipstick (Completed)    Boil, axilla        Relevant Medications    sulfamethoxazole-trimethoprim (BACTRIM DS,SEPTRA DS) 800-160 MG per tablet    Candida vaginitis        Relevant Medications    sulfamethoxazole-trimethoprim (BACTRIM DS,SEPTRA DS) 800-160 MG per tablet    fluconazole (DIFLUCAN) tablet 150 mg      Patient Active Problem List   Diagnosis Date Noted  . [redacted] weeks gestation of pregnancy   . Significant discrepancy between uterine size and clinical dates, antepartum   . Poor weight gain of pregnancy   . Frontal sinusitis 07/12/2014  . [redacted] weeks gestation of pregnancy   . Evaluate anatomy not seen on prior sonogram   . Echogenic focus of bowel of fetal affecting antepartum care of mother   . Echogenic bowel of fetus on prenatal ultrasound   . [redacted] weeks gestation of pregnancy   . Nausea and vomiting in pregnancy prior to [redacted] weeks gestation 06/02/2014  . Insomnia 06/02/2014  . Other complicated headache syndrome 06/02/2014  . Nausea with vomiting 06/02/2014  . GERD without esophagitis 05/16/2014  . AMA (advanced maternal age) multigravida 35+ 05/16/2014  . ALLERGIC RHINITIS 03/19/2007   Objective:    BP 114/72 mmHg  Pulse 94  Temp(Src) 98.9 F (37.2 C)  Wt 197 lb (89.359 kg)  LMP 02/03/2014 FHT:  150 BPM  Uterine Size: size equals dates  Presentation: unsure     Assessment:    Pregnancy @ [redacted]w[redacted]d weeks   Plan:     labs reviewed, problem list updated Consent signed. GBS sent TDAP offered  Rhogam given for RH negative Pediatrician:  discussed. Infant feeding: plans to breastfeed. Maternity leave: discussed. Cigarette smoking: former smoker. Orders Placed This Encounter  Procedures  . POCT urinalysis dipstick   Meds ordered this encounter  Medications  . sulfamethoxazole-trimethoprim (BACTRIM DS,SEPTRA DS) 800-160 MG per tablet    Sig: Take 1 tablet by mouth 2 (two) times daily.    Dispense:  28 tablet    Refill:  2  . zolpidem (AMBIEN) 10 MG tablet    Sig: Take 1 tablet (10 mg total) by mouth at bedtime as needed for sleep.    Dispense:  30 tablet    Refill:  3  . fluconazole (DIFLUCAN) 150 MG tablet    Sig: Take 1 tablet (150 mg total) by mouth once.    Dispense:  1 tablet    Refill:  2   Follow up in 2 Weeks.

## 2014-09-07 SURGERY — Surgical Case
Anesthesia: *Unknown

## 2014-09-11 ENCOUNTER — Other Ambulatory Visit: Payer: Self-pay | Admitting: *Deleted

## 2014-09-19 ENCOUNTER — Encounter: Payer: Self-pay | Admitting: Obstetrics

## 2014-09-19 ENCOUNTER — Other Ambulatory Visit: Payer: Self-pay | Admitting: Obstetrics

## 2014-09-19 ENCOUNTER — Ambulatory Visit (INDEPENDENT_AMBULATORY_CARE_PROVIDER_SITE_OTHER): Payer: Medicaid Other | Admitting: Obstetrics

## 2014-09-19 VITALS — BP 123/73 | HR 97 | Temp 97.8°F | Wt 194.0 lb

## 2014-09-19 DIAGNOSIS — O09523 Supervision of elderly multigravida, third trimester: Secondary | ICD-10-CM

## 2014-09-19 DIAGNOSIS — Z3483 Encounter for supervision of other normal pregnancy, third trimester: Secondary | ICD-10-CM

## 2014-09-19 LAB — POCT URINALYSIS DIPSTICK
BILIRUBIN UA: NEGATIVE
GLUCOSE UA: NEGATIVE
KETONES UA: NEGATIVE
LEUKOCYTES UA: NEGATIVE
Nitrite, UA: NEGATIVE
Protein, UA: NEGATIVE
RBC UA: NEGATIVE
SPEC GRAV UA: 1.015
Urobilinogen, UA: NEGATIVE
pH, UA: 6

## 2014-09-19 NOTE — Progress Notes (Signed)
Subjective:    [name] is a 34 y.o. female being seen today for her obstetrical visit. She is at [redacted]w[redacted]d gestation. Patient reports no complaints. Fetal movement: normal.  Problem List Items Addressed This Visit    None    Visit Diagnoses    Encounter for supervision of other normal pregnancy in third trimester    -  Primary    Relevant Orders    POCT urinalysis dipstick (Completed)    SureSwab, Vaginosis/Vaginitis Plus    Strep B DNA probe      Patient Active Problem List   Diagnosis Date Noted  . [redacted] weeks gestation of pregnancy   . Significant discrepancy between uterine size and clinical dates, antepartum   . Poor weight gain of pregnancy   . Frontal sinusitis 07/12/2014  . [redacted] weeks gestation of pregnancy   . Evaluate anatomy not seen on prior sonogram   . Echogenic focus of bowel of fetal affecting antepartum care of mother   . Echogenic bowel of fetus on prenatal ultrasound   . [redacted] weeks gestation of pregnancy   . Nausea and vomiting in pregnancy prior to [redacted] weeks gestation 06/02/2014  . Insomnia 06/02/2014  . Other complicated headache syndrome 06/02/2014  . Nausea with vomiting 06/02/2014  . GERD without esophagitis 05/16/2014  . AMA (advanced maternal age) multigravida 35+ 05/16/2014  . ALLERGIC RHINITIS 03/19/2007   Objective:    BP 123/73 mmHg  Pulse 97  Temp(Src) 97.8 F (36.6 C)  Wt 194 lb (87.998 kg)  LMP 02/03/2014 FHT:  150 BPM  Uterine Size: size greater than dates  Presentation: unsure     Assessment:    Pregnancy @ [redacted]w[redacted]d weeks   Plan:     labs reviewed, problem list updated Consent signed. GBS sent TDAP offered  Rhogam given for RH negative Pediatrician: discussed. Infant feeding: plans to breastfeed. Maternity leave: discussed. Cigarette smoking: quit Sept. 2015. Orders Placed This Encounter  Procedures  . SureSwab, Vaginosis/Vaginitis Plus  . Strep B DNA probe  . POCT urinalysis dipstick   No orders of the defined types were  placed in this encounter.   Follow up in 2 Weeks.

## 2014-09-21 LAB — STREP B DNA PROBE: GBSP: NOT DETECTED

## 2014-09-22 ENCOUNTER — Other Ambulatory Visit: Payer: Self-pay | Admitting: Obstetrics

## 2014-09-22 DIAGNOSIS — B9689 Other specified bacterial agents as the cause of diseases classified elsewhere: Secondary | ICD-10-CM

## 2014-09-22 DIAGNOSIS — N760 Acute vaginitis: Principal | ICD-10-CM

## 2014-09-22 LAB — SURESWAB, VAGINOSIS/VAGINITIS PLUS
Atopobium vaginae: NOT DETECTED Log (cells/mL)
BV CATEGORY: UNDETERMINED — AB
C. TROPICALIS, DNA: NOT DETECTED
C. albicans, DNA: NOT DETECTED
C. glabrata, DNA: NOT DETECTED
C. parapsilosis, DNA: NOT DETECTED
C. trachomatis RNA, TMA: NOT DETECTED
GARDNERELLA VAGINALIS: 6.7 Log (cells/mL)
LACTOBACILLUS SPECIES: 7.3 Log (cells/mL)
MEGASPHAERA SPECIES: 6.9 Log (cells/mL)
N. gonorrhoeae RNA, TMA: NOT DETECTED
T. vaginalis RNA, QL TMA: NOT DETECTED

## 2014-09-22 MED ORDER — TINIDAZOLE 500 MG PO TABS: 1000.0000 mg | ORAL_TABLET | Freq: Every day | ORAL | Status: DC

## 2014-09-25 ENCOUNTER — Other Ambulatory Visit: Payer: Self-pay | Admitting: *Deleted

## 2014-09-25 MED ORDER — METRONIDAZOLE 500 MG PO TABS: 500.0000 mg | ORAL_TABLET | Freq: Two times a day (BID) | ORAL | Status: DC

## 2014-09-25 NOTE — Progress Notes (Unsigned)
Metronidazole sent to pharmacy for +BV.  Pt to be made aware. Rx change due to delay in approval on Tinidazole.

## 2014-10-03 ENCOUNTER — Encounter: Payer: Self-pay | Admitting: Obstetrics

## 2014-10-03 ENCOUNTER — Ambulatory Visit (INDEPENDENT_AMBULATORY_CARE_PROVIDER_SITE_OTHER): Payer: Medicaid Other | Admitting: Obstetrics

## 2014-10-03 VITALS — BP 121/75 | HR 89 | Temp 98.7°F | Wt 206.0 lb

## 2014-10-03 DIAGNOSIS — Z3483 Encounter for supervision of other normal pregnancy, third trimester: Secondary | ICD-10-CM | POA: Diagnosis not present

## 2014-10-03 LAB — POCT URINALYSIS DIPSTICK
BILIRUBIN UA: NEGATIVE
Glucose, UA: NEGATIVE
Ketones, UA: NEGATIVE
Nitrite, UA: NEGATIVE
Protein, UA: NEGATIVE
RBC UA: NEGATIVE
SPEC GRAV UA: 1.015
UROBILINOGEN UA: NEGATIVE
pH, UA: 6

## 2014-10-03 NOTE — Progress Notes (Signed)
Subjective:    [name] is a 34 y.o. female being seen today for her obstetrical visit. She is at [redacted]w[redacted]d gestation. Patient reports no complaints. Fetal movement: normal.  Problem List Items Addressed This Visit    None    Visit Diagnoses    Encounter for supervision of other normal pregnancy in third trimester    -  Primary    Relevant Orders    POCT urinalysis dipstick (Completed)      Patient Active Problem List   Diagnosis Date Noted  . [redacted] weeks gestation of pregnancy   . Significant discrepancy between uterine size and clinical dates, antepartum   . Poor weight gain of pregnancy   . Frontal sinusitis 07/12/2014  . [redacted] weeks gestation of pregnancy   . Evaluate anatomy not seen on prior sonogram   . Echogenic focus of bowel of fetal affecting antepartum care of mother   . Echogenic bowel of fetus on prenatal ultrasound   . [redacted] weeks gestation of pregnancy   . Nausea and vomiting in pregnancy prior to [redacted] weeks gestation 06/02/2014  . Insomnia 06/02/2014  . Other complicated headache syndrome 06/02/2014  . Nausea with vomiting 06/02/2014  . GERD without esophagitis 05/16/2014  . AMA (advanced maternal age) multigravida 35+ 05/16/2014  . ALLERGIC RHINITIS 03/19/2007   Objective:    BP 121/75 mmHg  Pulse 89  Temp(Src) 98.7 F (37.1 C)  Wt 206 lb (93.441 kg)  LMP 02/03/2014 FHT:  150 BPM  Uterine Size: size equals dates  Presentation: unsure     Assessment:    Pregnancy @ [redacted]w[redacted]d weeks   Plan:     labs reviewed, problem list updated Consent signed. GBS sent TDAP offered  Rhogam given for RH negative Pediatrician: discussed. Infant feeding: plans to breastfeed. Maternity leave: discussed. Cigarette smoking: quit 9 / 2015. Orders Placed This Encounter  Procedures  . POCT urinalysis dipstick   No orders of the defined types were placed in this encounter.   Follow up in 1 Week.

## 2014-10-05 ENCOUNTER — Telehealth: Payer: Self-pay | Admitting: *Deleted

## 2014-10-05 DIAGNOSIS — B3731 Acute candidiasis of vulva and vagina: Secondary | ICD-10-CM

## 2014-10-05 DIAGNOSIS — B373 Candidiasis of vulva and vagina: Secondary | ICD-10-CM

## 2014-10-05 MED ORDER — FLUCONAZOLE 150 MG PO TABS: 150.0000 mg | ORAL_TABLET | Freq: Once | ORAL | Status: DC

## 2014-10-05 NOTE — Telephone Encounter (Signed)
Patient states she has been on a lot of antibiotics lately and she is requesting antibiotic treatment. 11:36 Rx refilled per Dr Jodi Mourning permission.

## 2014-10-10 ENCOUNTER — Encounter: Payer: Self-pay | Admitting: Obstetrics

## 2014-10-10 ENCOUNTER — Telehealth: Payer: Self-pay | Admitting: *Deleted

## 2014-10-10 ENCOUNTER — Ambulatory Visit (INDEPENDENT_AMBULATORY_CARE_PROVIDER_SITE_OTHER): Payer: Medicaid Other | Admitting: Obstetrics

## 2014-10-10 ENCOUNTER — Encounter: Payer: Self-pay | Admitting: *Deleted

## 2014-10-10 VITALS — BP 115/68 | HR 87 | Temp 98.4°F | Wt 210.0 lb

## 2014-10-10 DIAGNOSIS — Z3483 Encounter for supervision of other normal pregnancy, third trimester: Secondary | ICD-10-CM

## 2014-10-10 DIAGNOSIS — Z9889 Other specified postprocedural states: Secondary | ICD-10-CM

## 2014-10-10 DIAGNOSIS — Z98891 History of uterine scar from previous surgery: Secondary | ICD-10-CM

## 2014-10-10 DIAGNOSIS — O09523 Supervision of elderly multigravida, third trimester: Secondary | ICD-10-CM

## 2014-10-10 DIAGNOSIS — O1203 Gestational edema, third trimester: Secondary | ICD-10-CM

## 2014-10-10 LAB — POCT URINALYSIS DIPSTICK
BILIRUBIN UA: NEGATIVE
GLUCOSE UA: NEGATIVE
KETONES UA: NEGATIVE
Leukocytes, UA: NEGATIVE
Nitrite, UA: NEGATIVE
Protein, UA: NEGATIVE
RBC UA: NEGATIVE
Spec Grav, UA: 1.01
Urobilinogen, UA: NEGATIVE
pH, UA: 7

## 2014-10-10 NOTE — Progress Notes (Signed)
Subjective:    [name] is a 34 y.o. female being seen today for her obstetrical visit. She is at 107w4d gestation. Patient reports swellinjg of feet real bad. Fetal movement: normal.  Problem List Items Addressed This Visit    None    Visit Diagnoses    Encounter for supervision of other normal pregnancy in third trimester    -  Primary    Relevant Orders    POCT urinalysis dipstick (Completed)    Fetal non-stress test    Swelling of lower extremity during pregnancy in third trimester        Relevant Orders    Fetal non-stress test      Patient Active Problem List   Diagnosis Date Noted  . [redacted] weeks gestation of pregnancy   . Significant discrepancy between uterine size and clinical dates, antepartum   . Poor weight gain of pregnancy   . Frontal sinusitis 07/12/2014  . [redacted] weeks gestation of pregnancy   . Evaluate anatomy not seen on prior sonogram   . Echogenic focus of bowel of fetal affecting antepartum care of mother   . Echogenic bowel of fetus on prenatal ultrasound   . [redacted] weeks gestation of pregnancy   . Nausea and vomiting in pregnancy prior to [redacted] weeks gestation 06/02/2014  . Insomnia 06/02/2014  . Other complicated headache syndrome 06/02/2014  . Nausea with vomiting 06/02/2014  . GERD without esophagitis 05/16/2014  . AMA (advanced maternal age) multigravida 35+ 05/16/2014  . ALLERGIC RHINITIS 03/19/2007   Objective:    BP 115/68 mmHg  Pulse 87  Temp(Src) 98.4 F (36.9 C)  Wt 210 lb (95.255 kg)  PF   LMP 02/03/2014 FHT:  150 BPM  Uterine Size: size equals dates  Presentation: unsure    NST:  Reactive  Assessment:    Pregnancy @ [redacted]w[redacted]d weeks    Lower extremity edema.  Plan:   Maternity leave started.  Rest more with feet elevated.    labs reviewed, problem list updated Consent signed. GBS sent TDAP offered  Rhogam given for RH negative Pediatrician: discussed. Infant feeding: plans to breastfeed. Maternity leave: discussed.  Started  today. Cigarette smoking: former smoker. Orders Placed This Encounter  Procedures  . Fetal non-stress test    Standing Status: Standing     Number of Occurrences: 1     Standing Expiration Date:   . POCT urinalysis dipstick   No orders of the defined types were placed in this encounter.   Follow up in 1 Week.

## 2014-10-10 NOTE — Telephone Encounter (Signed)
3/29 9:45 Patient called on call service with compaints of swelling to knees and SOB. Patient was advised to go to [facility] for evaluation, but refused. Call to patient this am- she has an appointment today- advised patient to keep appointment at which time we will evaluate her symptoms. Nurses advised to O2 sat, NST, serial BP checks and peak flow on patient. Dr Jodi Mourning is aware.

## 2014-10-17 ENCOUNTER — Encounter: Payer: Self-pay | Admitting: Obstetrics

## 2014-10-17 ENCOUNTER — Ambulatory Visit (INDEPENDENT_AMBULATORY_CARE_PROVIDER_SITE_OTHER): Payer: Medicaid Other | Admitting: Obstetrics

## 2014-10-17 VITALS — BP 135/76 | Temp 98.9°F | Wt 204.0 lb

## 2014-10-17 DIAGNOSIS — O09523 Supervision of elderly multigravida, third trimester: Secondary | ICD-10-CM

## 2014-10-17 DIAGNOSIS — Z029 Encounter for administrative examinations, unspecified: Secondary | ICD-10-CM

## 2014-10-17 DIAGNOSIS — J302 Other seasonal allergic rhinitis: Secondary | ICD-10-CM

## 2014-10-17 LAB — POCT URINALYSIS DIPSTICK
BILIRUBIN UA: NEGATIVE
Blood, UA: NEGATIVE
GLUCOSE UA: NEGATIVE
Ketones, UA: NEGATIVE
LEUKOCYTES UA: NEGATIVE
Nitrite, UA: NEGATIVE
PROTEIN UA: NEGATIVE
Spec Grav, UA: 1.015
Urobilinogen, UA: 1
pH, UA: 6

## 2014-10-17 MED ORDER — LORATADINE 10 MG PO TABS: 10.0000 mg | ORAL_TABLET | Freq: Every day | ORAL | Status: DC

## 2014-10-17 NOTE — Progress Notes (Signed)
Subjective:    [name] is a 34 y.o. female being seen today for her obstetrical visit. She is at [redacted]w[redacted]d gestation. Patient reports heartburn and allergies. Fetal movement: normal.  Problem List Items Addressed This Visit    AMA (advanced maternal age) multigravida 73+ - Primary    Other Visit Diagnoses    Seasonal allergies        Relevant Medications    loratadine (CLARITIN) tablet 10 mg      Patient Active Problem List   Diagnosis Date Noted  . [redacted] weeks gestation of pregnancy   . Significant discrepancy between uterine size and clinical dates, antepartum   . Poor weight gain of pregnancy   . Frontal sinusitis 07/12/2014  . [redacted] weeks gestation of pregnancy   . Evaluate anatomy not seen on prior sonogram   . Echogenic focus of bowel of fetal affecting antepartum care of mother   . Echogenic bowel of fetus on prenatal ultrasound   . [redacted] weeks gestation of pregnancy   . Nausea and vomiting in pregnancy prior to [redacted] weeks gestation 06/02/2014  . Insomnia 06/02/2014  . Other complicated headache syndrome 06/02/2014  . Nausea with vomiting 06/02/2014  . GERD without esophagitis 05/16/2014  . AMA (advanced maternal age) multigravida 35+ 05/16/2014  . ALLERGIC RHINITIS 03/19/2007   Objective:    BP 135/76 mmHg  Temp(Src) 98.9 F (37.2 C)  Wt 204 lb (92.534 kg)  LMP 02/03/2014 FHT:  150 BPM  Uterine Size: size equals dates  Presentation: cephalic     Assessment:    Pregnancy @ [redacted]w[redacted]d weeks   Plan:     labs reviewed, problem list updated Consent signed. GBS sent TDAP offered  Rhogam given for RH negative Pediatrician: discussed. Infant feeding: plans to breastfeed. Maternity leave: discussed. Cigarette smoking: former smoker. No orders of the defined types were placed in this encounter.   Meds ordered this encounter  Medications  . loratadine (CLARITIN) 10 MG tablet    Sig: Take 1 tablet (10 mg total) by mouth daily.    Dispense:  30 tablet    Refill:  11    Follow up in 1 Week.

## 2014-10-17 NOTE — Addendum Note (Signed)
Addended by: Lewie Loron D on: 10/17/2014 04:16 PM   Modules accepted: Orders

## 2014-10-24 ENCOUNTER — Encounter: Payer: Self-pay | Admitting: Obstetrics

## 2014-10-24 ENCOUNTER — Ambulatory Visit (INDEPENDENT_AMBULATORY_CARE_PROVIDER_SITE_OTHER): Payer: Medicaid Other | Admitting: Obstetrics

## 2014-10-24 VITALS — BP 143/82 | HR 93 | Temp 98.9°F | Wt 213.0 lb

## 2014-10-24 DIAGNOSIS — Z9889 Other specified postprocedural states: Secondary | ICD-10-CM

## 2014-10-24 DIAGNOSIS — O1203 Gestational edema, third trimester: Secondary | ICD-10-CM

## 2014-10-24 DIAGNOSIS — O0943 Supervision of pregnancy with grand multiparity, third trimester: Secondary | ICD-10-CM | POA: Diagnosis not present

## 2014-10-24 DIAGNOSIS — Z98891 History of uterine scar from previous surgery: Secondary | ICD-10-CM

## 2014-10-24 DIAGNOSIS — O09523 Supervision of elderly multigravida, third trimester: Secondary | ICD-10-CM

## 2014-10-24 LAB — POCT URINALYSIS DIPSTICK
Blood, UA: NEGATIVE
Glucose, UA: NEGATIVE
KETONES UA: 2
LEUKOCYTES UA: NEGATIVE
Nitrite, UA: NEGATIVE
Spec Grav, UA: 1.015
pH, UA: 6

## 2014-10-24 NOTE — Progress Notes (Signed)
Patient reports she is still swelling with no pitting. Patient c/o fatigue. Patient is feeling a lot of pressure.

## 2014-10-24 NOTE — Progress Notes (Signed)
Subjective:    [name] is a 34 y.o. female being seen today for her obstetrical visit. She is at [redacted]w[redacted]d gestation. Patient reports backache and swelling of feet. Fetal movement: normal.  Problem List Items Addressed This Visit    None    Visit Diagnoses    Supervision of pregnancy with grand multiparity, third trimester    -  Primary    Relevant Orders    POCT urinalysis dipstick (Completed)    Protein / creatinine ratio, urine      Patient Active Problem List   Diagnosis Date Noted  . [redacted] weeks gestation of pregnancy   . Significant discrepancy between uterine size and clinical dates, antepartum   . Poor weight gain of pregnancy   . Frontal sinusitis 07/12/2014  . [redacted] weeks gestation of pregnancy   . Evaluate anatomy not seen on prior sonogram   . Echogenic focus of bowel of fetal affecting antepartum care of mother   . Echogenic bowel of fetus on prenatal ultrasound   . [redacted] weeks gestation of pregnancy   . Nausea and vomiting in pregnancy prior to [redacted] weeks gestation 06/02/2014  . Insomnia 06/02/2014  . Other complicated headache syndrome 06/02/2014  . Nausea with vomiting 06/02/2014  . GERD without esophagitis 05/16/2014  . AMA (advanced maternal age) multigravida 35+ 05/16/2014  . ALLERGIC RHINITIS 03/19/2007    Objective:    BP 143/82 mmHg  Pulse 93  Temp(Src) 98.9 F (37.2 C)  Wt 213 lb (96.616 kg)  LMP 02/03/2014 FHT: 150 BPM  Uterine Size: size equals dates  Presentations: unsure  Pelvic Exam: Deferred    Assessment:    Pregnancy @ [redacted]w[redacted]d weeks    Previous C/S x 2  Plan:   Plans for delivery: C/Section scheduled; labs reviewed; problem list updated Counseling: Consent signed. Infant feeding: plans to breastfeed. Cigarette smoking: quit 2015. L&D discussion: symptoms of labor, discussed when to call, discussed what number to call, anesthetic/analgesic options reviewed and delivering clinician:  plans Physician. Postpartum supports and preparation:  circumcision discussed and contraception plans discussed.  Follow up in 1 Week.

## 2014-10-25 LAB — PROTEIN / CREATININE RATIO, URINE
CREATININE, URINE: 134.1 mg/dL
PROTEIN CREATININE RATIO: 0.19 — AB (ref <0.15)
Total Protein, Urine: 26 mg/dL — ABNORMAL HIGH (ref 5–24)

## 2014-10-31 ENCOUNTER — Other Ambulatory Visit: Payer: Self-pay | Admitting: *Deleted

## 2014-10-31 ENCOUNTER — Encounter: Payer: Medicaid Other | Admitting: Obstetrics

## 2014-11-02 NOTE — Anesthesia Preprocedure Evaluation (Addendum)
Anesthesia Evaluation  Patient identified by MRN, date of birth, ID band Patient awake    Reviewed: Allergy & Precautions, NPO status , Patient's Chart, lab work & pertinent test results  History of Anesthesia Complications Negative for: history of anesthetic complications  Airway Mallampati: II  TM Distance: >3 FB Neck ROM: Full    Dental no notable dental hx. (+) Dental Advisory Given   Pulmonary neg pulmonary ROS, former smoker,  breath sounds clear to auscultation  Pulmonary exam normal       Cardiovascular negative cardio ROS  Rhythm:Regular Rate:Normal     Neuro/Psych  Headaches, PSYCHIATRIC DISORDERS Anxiety Depression    GI/Hepatic Neg liver ROS, GERD-  Medicated and Controlled,  Endo/Other  obesity  Renal/GU negative Renal ROS  negative genitourinary   Musculoskeletal negative musculoskeletal ROS (+)   Abdominal   Peds negative pediatric ROS (+)  Hematology negative hematology ROS (+)   Anesthesia Other Findings   Reproductive/Obstetrics (+) Pregnancy                            Anesthesia Physical Anesthesia Plan  ASA: III  Anesthesia Plan: Spinal   Post-op Pain Management:    Induction:   Airway Management Planned:   Additional Equipment:   Intra-op Plan:   Post-operative Plan:   Informed Consent: I have reviewed the patients History and Physical, chart, labs and discussed the procedure including the risks, benefits and alternatives for the proposed anesthesia with the patient or authorized representative who has indicated his/her understanding and acceptance.   Dental advisory given  Plan Discussed with: CRNA  Anesthesia Plan Comments:         Anesthesia Quick Evaluation

## 2014-11-03 ENCOUNTER — Encounter (HOSPITAL_COMMUNITY): Payer: Self-pay | Admitting: *Deleted

## 2014-11-03 ENCOUNTER — Inpatient Hospital Stay (HOSPITAL_COMMUNITY): Payer: Medicaid Other | Admitting: Anesthesiology

## 2014-11-03 ENCOUNTER — Inpatient Hospital Stay (HOSPITAL_COMMUNITY)
Admission: RE | Admit: 2014-11-03 | Discharge: 2014-11-06 | DRG: 766 | Disposition: A | Discharge status: Home / Self Care | Payer: Medicaid Other | Source: Ambulatory Visit | Attending: Obstetrics | Admitting: Obstetrics

## 2014-11-03 ENCOUNTER — Other Ambulatory Visit: Payer: Self-pay | Admitting: Obstetrics

## 2014-11-03 ENCOUNTER — Encounter (HOSPITAL_COMMUNITY)
Admission: RE | Disposition: A | Discharge status: Home / Self Care | Payer: Self-pay | Source: Ambulatory Visit | Attending: Obstetrics

## 2014-11-03 ENCOUNTER — Inpatient Hospital Stay (HOSPITAL_COMMUNITY): Admission: RE | Admit: 2014-11-03 | Payer: Medicaid Other | Source: Ambulatory Visit

## 2014-11-03 DIAGNOSIS — O3421 Maternal care for scar from previous cesarean delivery: Secondary | ICD-10-CM | POA: Diagnosis not present

## 2014-11-03 DIAGNOSIS — N858 Other specified noninflammatory disorders of uterus: Secondary | ICD-10-CM | POA: Diagnosis present

## 2014-11-03 DIAGNOSIS — Z302 Encounter for sterilization: Secondary | ICD-10-CM | POA: Diagnosis not present

## 2014-11-03 DIAGNOSIS — Z98891 History of uterine scar from previous surgery: Secondary | ICD-10-CM

## 2014-11-03 DIAGNOSIS — Z23 Encounter for immunization: Secondary | ICD-10-CM | POA: Diagnosis not present

## 2014-11-03 DIAGNOSIS — O9903 Anemia complicating the puerperium: Secondary | ICD-10-CM | POA: Diagnosis present

## 2014-11-03 DIAGNOSIS — O09523 Supervision of elderly multigravida, third trimester: Secondary | ICD-10-CM | POA: Diagnosis not present

## 2014-11-03 DIAGNOSIS — Z3A39 39 weeks gestation of pregnancy: Secondary | ICD-10-CM

## 2014-11-03 DIAGNOSIS — D649 Anemia, unspecified: Secondary | ICD-10-CM | POA: Diagnosis not present

## 2014-11-03 HISTORY — DX: History of uterine scar from previous surgery: Z98.891

## 2014-11-03 LAB — TYPE AND SCREEN
ABO/RH(D): A POS
Antibody Screen: NEGATIVE

## 2014-11-03 LAB — COMPREHENSIVE METABOLIC PANEL
ALBUMIN: 2.5 g/dL — AB (ref 3.5–5.2)
ALT: 21 U/L (ref 0–35)
AST: 34 U/L (ref 0–37)
Alkaline Phosphatase: 95 U/L (ref 39–117)
Anion gap: 6 (ref 5–15)
BUN: 8 mg/dL (ref 6–23)
CALCIUM: 8.3 mg/dL — AB (ref 8.4–10.5)
CO2: 27 mmol/L (ref 19–32)
Chloride: 103 mmol/L (ref 96–112)
Creatinine, Ser: 0.49 mg/dL — ABNORMAL LOW (ref 0.50–1.10)
GFR calc Af Amer: >90 mL/min (ref >90)
GFR calc non Af Amer: >90 mL/min (ref >90)
Glucose, Bld: 107 mg/dL — ABNORMAL HIGH (ref 70–99)
Potassium: 3 mmol/L — ABNORMAL LOW (ref 3.5–5.1)
Sodium: 136 mmol/L (ref 135–145)
Total Bilirubin: 0.5 mg/dL (ref 0.3–1.2)
Total Protein: 5.3 g/dL — ABNORMAL LOW (ref 6.0–8.3)

## 2014-11-03 LAB — CBC
HCT: 29.6 % — ABNORMAL LOW (ref 36.0–46.0)
HCT: 28.4 % — ABNORMAL LOW (ref 36.0–46.0)
HEMOGLOBIN: 10 g/dL — AB (ref 12.0–15.0)
Hemoglobin: 9.3 g/dL — ABNORMAL LOW (ref 12.0–15.0)
MCH: 32.3 pg (ref 26.0–34.0)
MCH: 31.5 pg (ref 26.0–34.0)
MCHC: 33.8 g/dL (ref 30.0–36.0)
MCHC: 32.7 g/dL (ref 30.0–36.0)
MCV: 95.5 fL (ref 78.0–100.0)
MCV: 96.3 fL (ref 78.0–100.0)
PLATELETS: 166 10*3/uL (ref 150–400)
PLATELETS: 159 10*3/uL (ref 150–400)
RBC: 3.1 MIL/uL — AB (ref 3.87–5.11)
RBC: 2.95 MIL/uL — AB (ref 3.87–5.11)
RDW: 15.3 % (ref 11.5–15.5)
RDW: 15.1 % (ref 11.5–15.5)
WBC: 8.1 10*3/uL (ref 4.0–10.5)
WBC: 12.4 10*3/uL — AB (ref 4.0–10.5)

## 2014-11-03 LAB — BASIC METABOLIC PANEL
Anion gap: 7 (ref 5–15)
BUN: 12 mg/dL (ref 6–23)
CO2: 23 mmol/L (ref 19–32)
CREATININE: 0.44 mg/dL — AB (ref 0.50–1.10)
Calcium: 8.7 mg/dL (ref 8.4–10.5)
Chloride: 109 mmol/L (ref 96–112)
GFR calc non Af Amer: >90 mL/min (ref >90)
Glucose, Bld: 80 mg/dL (ref 70–99)
POTASSIUM: 3.5 mmol/L (ref 3.5–5.1)
Sodium: 139 mmol/L (ref 135–145)

## 2014-11-03 LAB — URIC ACID: URIC ACID, SERUM: 3 mg/dL (ref 2.4–7.0)

## 2014-11-03 LAB — LACTATE DEHYDROGENASE: LDH: 200 U/L (ref 94–250)

## 2014-11-03 SURGERY — Surgical Case
Anesthesia: Spinal | Laterality: Bilateral

## 2014-11-03 MED ORDER — ERYTHROMYCIN 5 MG/GM OP OINT
TOPICAL_OINTMENT | OPHTHALMIC | Status: AC
  Filled 2014-11-03: qty 1

## 2014-11-03 MED ORDER — LANOLIN HYDROUS EX OINT: 1.0000 "application " | TOPICAL_OINTMENT | CUTANEOUS | Status: DC | PRN

## 2014-11-03 MED ORDER — LACTATED RINGERS IV SOLN
INTRAVENOUS | Status: DC
  Administered 2014-11-03 (×4): via INTRAVENOUS

## 2014-11-03 MED ORDER — ZOLPIDEM TARTRATE 5 MG PO TABS: 5.0000 mg | ORAL_TABLET | Freq: Every evening | ORAL | Status: DC | PRN

## 2014-11-03 MED ORDER — NALBUPHINE HCL 10 MG/ML IJ SOLN: 5.0000 mg | INTRAMUSCULAR | Status: DC | PRN

## 2014-11-03 MED ORDER — LACTATED RINGERS IV SOLN
INTRAVENOUS | Status: DC | PRN
  Administered 2014-11-03: 12:00:00 via INTRAVENOUS

## 2014-11-03 MED ORDER — SCOPOLAMINE 1 MG/3DAYS TD PT72
MEDICATED_PATCH | TRANSDERMAL | Status: AC
  Administered 2014-11-03: 1.5 mg via TRANSDERMAL
  Filled 2014-11-03: qty 1

## 2014-11-03 MED ORDER — SCOPOLAMINE 1 MG/3DAYS TD PT72
1.0000 | MEDICATED_PATCH | Freq: Once | TRANSDERMAL | Status: AC
  Administered 2014-11-03: 1.5 mg via TRANSDERMAL

## 2014-11-03 MED ORDER — FENTANYL CITRATE (PF) 100 MCG/2ML IJ SOLN
INTRAMUSCULAR | Status: AC
  Filled 2014-11-03: qty 2

## 2014-11-03 MED ORDER — NALOXONE HCL 0.4 MG/ML IJ SOLN: 0.4000 mg | INTRAMUSCULAR | Status: DC | PRN

## 2014-11-03 MED ORDER — SIMETHICONE 80 MG PO CHEW
80.0000 mg | CHEWABLE_TABLET | Freq: Three times a day (TID) | ORAL | Status: DC
  Administered 2014-11-03 – 2014-11-06 (×9): 80 mg via ORAL
  Filled 2014-11-03 (×9): qty 1

## 2014-11-03 MED ORDER — SODIUM CHLORIDE 0.9 % IJ SOLN: 3.0000 mL | INTRAMUSCULAR | Status: DC | PRN

## 2014-11-03 MED ORDER — SENNOSIDES-DOCUSATE SODIUM 8.6-50 MG PO TABS
2.0000 | ORAL_TABLET | ORAL | Status: DC
  Administered 2014-11-04 – 2014-11-06 (×3): 2 via ORAL
  Filled 2014-11-03 (×3): qty 2

## 2014-11-03 MED ORDER — PHENYLEPHRINE 8 MG IN D5W 100 ML (0.08MG/ML) PREMIX OPTIME
INJECTION | INTRAVENOUS | Status: AC
  Filled 2014-11-03: qty 100

## 2014-11-03 MED ORDER — PHENYLEPHRINE HCL 10 MG/ML IJ SOLN
INTRAMUSCULAR | Status: DC | PRN
  Administered 2014-11-03: 40 ug via INTRAVENOUS
  Administered 2014-11-03: 80 ug via INTRAVENOUS

## 2014-11-03 MED ORDER — NALOXONE HCL 1 MG/ML IJ SOLN: 1.0000 ug/kg/h | INTRAVENOUS | Status: DC | PRN

## 2014-11-03 MED ORDER — KETOROLAC TROMETHAMINE 30 MG/ML IJ SOLN: 30.0000 mg | Freq: Four times a day (QID) | INTRAMUSCULAR | Status: AC | PRN

## 2014-11-03 MED ORDER — FENTANYL CITRATE (PF) 100 MCG/2ML IJ SOLN
INTRAMUSCULAR | Status: DC | PRN
Start: 2014-11-03 — End: 2014-11-03
  Administered 2014-11-03: 10 ug via INTRATHECAL

## 2014-11-03 MED ORDER — TETANUS-DIPHTH-ACELL PERTUSSIS 5-2.5-18.5 LF-MCG/0.5 IM SUSP
0.5000 mL | Freq: Once | INTRAMUSCULAR | Status: AC
  Administered 2014-11-04: 0.5 mL via INTRAMUSCULAR

## 2014-11-03 MED ORDER — MEPERIDINE HCL 25 MG/ML IJ SOLN: 6.2500 mg | INTRAMUSCULAR | Status: DC | PRN

## 2014-11-03 MED ORDER — OXYTOCIN 10 UNIT/ML IJ SOLN
40.0000 [IU] | INTRAVENOUS | Status: DC | PRN
  Administered 2014-11-03: 40 [IU] via INTRAVENOUS

## 2014-11-03 MED ORDER — PRENATAL MULTIVITAMIN CH
1.0000 | ORAL_TABLET | Freq: Every day | ORAL | Status: DC
  Administered 2014-11-04 – 2014-11-06 (×3): 1 via ORAL
  Filled 2014-11-03 (×3): qty 1

## 2014-11-03 MED ORDER — SERTRALINE HCL 50 MG PO TABS
50.0000 mg | ORAL_TABLET | Freq: Every day | ORAL | Status: DC
  Filled 2014-11-03 (×2): qty 1

## 2014-11-03 MED ORDER — BUPIVACAINE LIPOSOME 1.3 % IJ SUSP
20.0000 mL | Freq: Once | INTRAMUSCULAR | Status: DC
  Filled 2014-11-03: qty 20

## 2014-11-03 MED ORDER — METHYLERGONOVINE MALEATE 0.2 MG PO TABS: 0.2000 mg | ORAL_TABLET | Freq: Four times a day (QID) | ORAL | Status: DC

## 2014-11-03 MED ORDER — TRIAMCINOLONE ACETONIDE 40 MG/ML IJ SUSP: 50.0000 mL | Freq: Once | INTRAMUSCULAR | Status: DC

## 2014-11-03 MED ORDER — CEFAZOLIN SODIUM-DEXTROSE 2-3 GM-% IV SOLR: 2.0000 g | INTRAVENOUS | Status: DC

## 2014-11-03 MED ORDER — DIBUCAINE 1 % RE OINT: 1.0000 "application " | TOPICAL_OINTMENT | RECTAL | Status: DC | PRN

## 2014-11-03 MED ORDER — OXYTOCIN 10 UNIT/ML IJ SOLN
INTRAMUSCULAR | Status: AC
  Filled 2014-11-03: qty 4

## 2014-11-03 MED ORDER — NALBUPHINE HCL 10 MG/ML IJ SOLN
5.0000 mg | Freq: Once | INTRAMUSCULAR | Status: AC | PRN
  Filled 2014-11-03: qty 1

## 2014-11-03 MED ORDER — OXYCODONE-ACETAMINOPHEN 5-325 MG PO TABS
1.0000 | ORAL_TABLET | ORAL | Status: DC | PRN
Start: 2014-11-03 — End: 2014-11-06
  Administered 2014-11-04 – 2014-11-05 (×7): 1 via ORAL
  Filled 2014-11-03 (×7): qty 1

## 2014-11-03 MED ORDER — ONDANSETRON HCL 4 MG/2ML IJ SOLN
INTRAMUSCULAR | Status: AC
  Filled 2014-11-03: qty 2

## 2014-11-03 MED ORDER — SCOPOLAMINE 1 MG/3DAYS TD PT72: 1.0000 | MEDICATED_PATCH | Freq: Once | TRANSDERMAL | Status: DC

## 2014-11-03 MED ORDER — OXYCODONE-ACETAMINOPHEN 5-325 MG PO TABS
2.0000 | ORAL_TABLET | ORAL | Status: DC | PRN
  Administered 2014-11-04 – 2014-11-06 (×4): 2 via ORAL
  Filled 2014-11-03 (×4): qty 2

## 2014-11-03 MED ORDER — PNEUMOCOCCAL VAC POLYVALENT 25 MCG/0.5ML IJ INJ
0.5000 mL | INJECTION | INTRAMUSCULAR | Status: AC
  Administered 2014-11-04: 0.5 mL via INTRAMUSCULAR
  Filled 2014-11-03: qty 0.5

## 2014-11-03 MED ORDER — DIPHENHYDRAMINE HCL 50 MG/ML IJ SOLN: 12.5000 mg | INTRAMUSCULAR | Status: DC | PRN

## 2014-11-03 MED ORDER — SIMETHICONE 80 MG PO CHEW: 80.0000 mg | CHEWABLE_TABLET | ORAL | Status: DC | PRN

## 2014-11-03 MED ORDER — PHENYLEPHRINE 8 MG IN D5W 100 ML (0.08MG/ML) PREMIX OPTIME
INJECTION | INTRAVENOUS | Status: DC | PRN
  Administered 2014-11-03: 40 ug/min via INTRAVENOUS

## 2014-11-03 MED ORDER — DIPHENHYDRAMINE HCL 25 MG PO CAPS
25.0000 mg | ORAL_CAPSULE | ORAL | Status: DC | PRN
  Administered 2014-11-03 – 2014-11-05 (×4): 25 mg via ORAL
  Filled 2014-11-03 (×3): qty 1

## 2014-11-03 MED ORDER — SIMETHICONE 80 MG PO CHEW
80.0000 mg | CHEWABLE_TABLET | ORAL | Status: DC
  Administered 2014-11-04 – 2014-11-06 (×3): 80 mg via ORAL
  Filled 2014-11-03 (×3): qty 1

## 2014-11-03 MED ORDER — BUPIVACAINE IN DEXTROSE 0.75-8.25 % IT SOLN
INTRATHECAL | Status: DC | PRN
  Administered 2014-11-03: 1.6 mL via INTRATHECAL

## 2014-11-03 MED ORDER — SODIUM CHLORIDE 0.9 % IJ SOLN
20.0000 mL | Freq: Once | INTRAMUSCULAR | Status: AC
Start: 2014-11-03 — End: 2014-11-03
  Administered 2014-11-03: 20 mL

## 2014-11-03 MED ORDER — NALBUPHINE HCL 10 MG/ML IJ SOLN: 5.0000 mg | Freq: Once | INTRAMUSCULAR | Status: AC | PRN

## 2014-11-03 MED ORDER — OXYTOCIN 40 UNITS IN LACTATED RINGERS INFUSION - SIMPLE MED: 62.5000 mL/h | INTRAVENOUS | Status: AC

## 2014-11-03 MED ORDER — ONDANSETRON HCL 4 MG/2ML IJ SOLN: 4.0000 mg | Freq: Once | INTRAMUSCULAR | Status: DC | PRN

## 2014-11-03 MED ORDER — MORPHINE SULFATE (PF) 0.5 MG/ML IJ SOLN
INTRAMUSCULAR | Status: DC | PRN
  Administered 2014-11-03: .2 mg via INTRATHECAL

## 2014-11-03 MED ORDER — ONDANSETRON HCL 4 MG/2ML IJ SOLN: 4.0000 mg | Freq: Three times a day (TID) | INTRAMUSCULAR | Status: DC | PRN

## 2014-11-03 MED ORDER — 0.9 % SODIUM CHLORIDE (POUR BTL) OPTIME
TOPICAL | Status: DC | PRN
  Administered 2014-11-03: 1000 mL

## 2014-11-03 MED ORDER — METHYLERGONOVINE MALEATE 0.2 MG/ML IJ SOLN
INTRAMUSCULAR | Status: DC | PRN
  Administered 2014-11-03: 0.2 mg via INTRAMUSCULAR

## 2014-11-03 MED ORDER — WITCH HAZEL-GLYCERIN EX PADS: 1.0000 "application " | MEDICATED_PAD | CUTANEOUS | Status: DC | PRN

## 2014-11-03 MED ORDER — CEFAZOLIN SODIUM-DEXTROSE 2-3 GM-% IV SOLR
INTRAVENOUS | Status: AC
  Administered 2014-11-03: 2 g via INTRAVENOUS
  Filled 2014-11-03: qty 50

## 2014-11-03 MED ORDER — IBUPROFEN 600 MG PO TABS
600.0000 mg | ORAL_TABLET | Freq: Four times a day (QID) | ORAL | Status: DC
  Administered 2014-11-04 – 2014-11-06 (×11): 600 mg via ORAL
  Filled 2014-11-03 (×11): qty 1

## 2014-11-03 MED ORDER — BUPIVACAINE LIPOSOME 1.3 % IJ SUSP
INTRAMUSCULAR | Status: DC | PRN
  Administered 2014-11-03: 20 mL

## 2014-11-03 MED ORDER — LACTATED RINGERS IV SOLN: INTRAVENOUS | Status: DC

## 2014-11-03 MED ORDER — METHYLERGONOVINE MALEATE 0.2 MG/ML IJ SOLN: 0.2000 mg | Freq: Four times a day (QID) | INTRAMUSCULAR | Status: DC

## 2014-11-03 MED ORDER — FENTANYL CITRATE (PF) 100 MCG/2ML IJ SOLN
25.0000 ug | INTRAMUSCULAR | Status: DC | PRN
  Administered 2014-11-03: 50 ug via INTRAVENOUS

## 2014-11-03 MED ORDER — NALBUPHINE HCL 10 MG/ML IJ SOLN
5.0000 mg | INTRAMUSCULAR | Status: DC | PRN
  Administered 2014-11-04: 5 mg via SUBCUTANEOUS
  Administered 2014-11-04: 10 mg via SUBCUTANEOUS
  Administered 2014-11-04 (×2): 5 mg via SUBCUTANEOUS
  Filled 2014-11-03 (×4): qty 1

## 2014-11-03 MED ORDER — ACETAMINOPHEN 325 MG PO TABS: 650.0000 mg | ORAL_TABLET | ORAL | Status: DC | PRN

## 2014-11-03 MED ORDER — SODIUM CHLORIDE 0.9 % IJ SOLN
INTRAMUSCULAR | Status: AC
  Filled 2014-11-03: qty 20

## 2014-11-03 MED ORDER — MENTHOL 3 MG MT LOZG
1.0000 | LOZENGE | OROMUCOSAL | Status: DC | PRN
Start: 2014-11-03 — End: 2014-11-06

## 2014-11-03 MED ORDER — MORPHINE SULFATE 0.5 MG/ML IJ SOLN
INTRAMUSCULAR | Status: AC
  Filled 2014-11-03: qty 10

## 2014-11-03 MED ORDER — METHYLERGONOVINE MALEATE 0.2 MG/ML IJ SOLN
INTRAMUSCULAR | Status: AC
  Filled 2014-11-03: qty 1

## 2014-11-03 MED ORDER — DIPHENHYDRAMINE HCL 25 MG PO CAPS
25.0000 mg | ORAL_CAPSULE | Freq: Four times a day (QID) | ORAL | Status: DC | PRN
  Filled 2014-11-03: qty 1

## 2014-11-03 SURGICAL SUPPLY — 41 items
APL SKNCLS STERI-STRIP NONHPOA (GAUZE/BANDAGES/DRESSINGS) ×1
BENZOIN TINCTURE PRP APPL 2/3 (GAUZE/BANDAGES/DRESSINGS) ×1 IMPLANT
CANISTER WOUND CARE 500ML ATS (WOUND CARE) IMPLANT
CLAMP CORD UMBIL (MISCELLANEOUS) IMPLANT
CLOTH BEACON ORANGE TIMEOUT ST (SAFETY) ×2 IMPLANT
CONTAINER PREFILL 10% NBF 15ML (MISCELLANEOUS) ×4 IMPLANT
DRAPE SHEET LG 3/4 BI-LAMINATE (DRAPES) IMPLANT
DRSG OPSITE POSTOP 4X10 (GAUZE/BANDAGES/DRESSINGS) ×2 IMPLANT
DRSG VAC ATS LRG SENSATRAC (GAUZE/BANDAGES/DRESSINGS) IMPLANT
DRSG VAC ATS MED SENSATRAC (GAUZE/BANDAGES/DRESSINGS) IMPLANT
DRSG VAC ATS SM SENSATRAC (GAUZE/BANDAGES/DRESSINGS) IMPLANT
DURAPREP 26ML APPLICATOR (WOUND CARE) ×2 IMPLANT
ELECT REM PT RETURN 9FT ADLT (ELECTROSURGICAL) ×2
ELECTRODE REM PT RTRN 9FT ADLT (ELECTROSURGICAL) ×1 IMPLANT
EXTRACTOR VACUUM M CUP 4 TUBE (SUCTIONS) IMPLANT
GLOVE BIO SURGEON STRL SZ8 (GLOVE) ×2 IMPLANT
GOWN STRL REUS W/TWL LRG LVL3 (GOWN DISPOSABLE) ×4 IMPLANT
KIT ABG SYR 3ML LUER SLIP (SYRINGE) IMPLANT
LIQUID BAND (GAUZE/BANDAGES/DRESSINGS) ×2 IMPLANT
NDL HYPO 25X5/8 SAFETYGLIDE (NEEDLE) ×1 IMPLANT
NEEDLE HYPO 22GX1.5 SAFETY (NEEDLE) ×2 IMPLANT
NEEDLE HYPO 25X5/8 SAFETYGLIDE (NEEDLE) ×2 IMPLANT
NS IRRIG 1000ML POUR BTL (IV SOLUTION) ×2 IMPLANT
PACK C SECTION WH (CUSTOM PROCEDURE TRAY) ×2 IMPLANT
PAD OB MATERNITY 4.3X12.25 (PERSONAL CARE ITEMS) ×2 IMPLANT
RTRCTR C-SECT PINK 25CM LRG (MISCELLANEOUS) ×2 IMPLANT
STAPLER VISISTAT 35W (STAPLE) IMPLANT
STRIP CLOSURE SKIN 1/2X4 (GAUZE/BANDAGES/DRESSINGS) ×1 IMPLANT
SUT GUT PLAIN 0 CT-3 TAN 27 (SUTURE) ×2 IMPLANT
SUT MNCRL 0 VIOLET CTX 36 (SUTURE) ×3 IMPLANT
SUT MNCRL AB 4-0 PS2 18 (SUTURE) IMPLANT
SUT MON AB 2-0 CT1 27 (SUTURE) ×2 IMPLANT
SUT MON AB 3-0 SH 27 (SUTURE)
SUT MON AB 3-0 SH27 (SUTURE) IMPLANT
SUT MONOCRYL 0 CTX 36 (SUTURE) ×3
SUT PLAIN 2 0 XLH (SUTURE) IMPLANT
SUT VIC AB 0 CTX 36 (SUTURE) ×4
SUT VIC AB 0 CTX36XBRD ANBCTRL (SUTURE) ×2 IMPLANT
SYR CONTROL 10ML LL (SYRINGE) ×3 IMPLANT
TOWEL OR 17X24 6PK STRL BLUE (TOWEL DISPOSABLE) ×2 IMPLANT
TRAY FOLEY CATH SILVER 14FR (SET/KITS/TRAYS/PACK) ×2 IMPLANT

## 2014-11-03 NOTE — Progress Notes (Signed)
I am charting under Dawn Kimberlyn Quiocho sign in-IT unable to reset password at time of pt admission.  I have taken care of this pt from admission until went to OR. Lauren MeisingerRN

## 2014-11-03 NOTE — Anesthesia Postprocedure Evaluation (Signed)
  Anesthesia Post-op Note  Patient: [name]  Procedure(s) Performed: Procedure(s) (LRB): CESAREAN SECTION WITH BILATERAL TUBAL LIGATION (Bilateral)  Patient Location: PACU  Anesthesia Type: Spinal  Level of Consciousness: awake and alert   Airway and Oxygen Therapy: Patient Spontanous Breathing  Post-op Pain: mild  Post-op Assessment: Post-op Vital signs reviewed, Patient's Cardiovascular Status Stable, Respiratory Function Stable, Patent Airway and No signs of Nausea or vomiting  Last Vitals:  Filed Vitals:   11/03/14 1515  BP: 137/79  Pulse: 82  Temp: 36.5 C  Resp: 18    Post-op Vital Signs: stable   Complications: No apparent anesthesia complications

## 2014-11-03 NOTE — Progress Notes (Signed)
Pt had repeat C/S today with EBL of 1516ml.  Methergine given in OR and ordered for 6 doses post-partum.  BP's on floor have been 141-153 / 79-93, above ordered parameters for administering methergine.  Lochia scant amount in PACU and on floor.  Pt has history of postpartum readmit for preeclampsia with previous child.  Today DTR's are 2+, no clonus, no HA, and no visual disturbances.  Edema in LEs 2+.  Phoned Dr. Ruthann Cancer with above information.  See new orders for labs and discontinuation of methergine.

## 2014-11-03 NOTE — H&P (Signed)
[name] is a 34 y.o. female presenting for repeat C/S. Maternal Medical History:  Reason for admission: Repeat C/S.  Prenatal complications: no prenatal complications Prenatal Complications - Diabetes: none.    OB History    Gravida Para Term Preterm AB TAB SAB Ectopic Multiple Living   7 2 2  0 4 3 1   2      Past Medical History  Diagnosis Date  . Depression     never been treated , "probably got some "  . Anxiety   . [id number](299.3)    Past Surgical History  Procedure Laterality Date  . Dilation and curettage of uterus    . Therapeutic abortion    . Cesarean section    . Cholecystectomy, laparoscopic     Family History: family history includes Cancer in her maternal aunt; Diabetes in her father; Hypertension in her father. Social History:  reports that she has quit smoking. Her smoking use included Cigarettes. She quit smokeless tobacco use about 7 months ago. She reports that she does not drink alcohol or use illicit drugs.   Prenatal Transfer Tool  Maternal Diabetes: No Genetic Screening: Normal Maternal Ultrasounds/Referrals: Normal Fetal Ultrasounds or other Referrals:  None Maternal Substance Abuse:  No Significant Maternal Medications:  None Significant Maternal Lab Results:  None Other Comments:  None  Review of Systems  All other systems reviewed and are negative.     Last menstrual period 02/03/2014. Maternal Exam:  Abdomen: Patient reports no abdominal tenderness.   Physical Exam  Nursing note and vitals reviewed. Constitutional: She is oriented to person, place, and time. She appears well-developed and well-nourished.  HENT:  Head: Normocephalic and atraumatic.  Eyes: Conjunctivae are normal. Pupils are equal, round, and reactive to light.  Neck: Normal range of motion. Neck supple.  Cardiovascular: Normal rate and regular rhythm.   Respiratory: Effort normal and breath sounds normal.  GI: Soft.  Musculoskeletal: Normal range of motion.   Neurological: She is alert and oriented to person, place, and time.  Skin: Skin is warm and dry.  Psychiatric: She has a normal mood and affect. Her behavior is normal. Judgment and thought content normal.    Prenatal labs: ABO, Rh: A/POS/-- (10/06 1702) Antibody: NEG (10/06 1702) Rubella: 0.94 (10/06 1702) RPR: NON REAC (01/12 0945)  HBsAg: NEGATIVE (10/06 1702)  HIV: NONREACTIVE (01/12 0945)  GBS: NOT DETECTED (03/08 1621)   Assessment/Plan: 39 weeks.  Previous C/S.  Desires repeat C/S.   HARPER,CHARLES A 11/03/2014, 7:41 AM

## 2014-11-03 NOTE — Anesthesia Procedure Notes (Signed)
Spinal Patient location during procedure: OR Start time: 11/03/2014 11:42 AM Staffing Anesthesiologist: JUDD, MARY Performed by: anesthesiologist  Preanesthetic Checklist Completed: patient identified, site marked, surgical consent, pre-op evaluation, timeout performed, IV checked, risks and benefits discussed and monitors and equipment checked Spinal Block Patient position: sitting Prep: Betadine Patient monitoring: heart rate, continuous pulse ox and blood pressure Approach: midline Location: L3-4 Injection technique: single-shot Needle Needle type: Sprotte  Needle gauge: 24 G Needle length: 9 cm Additional Notes Expiration date of kit checked and confirmed. Patient tolerated procedure well, without complications.  Functioning IV was confirmed and monitors were applied. Sterile prep and drape, including hand hygiene, mask and sterile gloves were used. The patient was positioned and the spine was prepped. The skin was anesthetized with lidocaine.  Free flow of clear CSF was obtained prior to injecting local anesthetic into the CSF.  The spinal needle aspirated freely following injection.  The needle was carefully withdrawn.  The patient tolerated the procedure well. Consent was obtained prior to procedure with all questions answered and concerns addressed. Risks including but not limited to bleeding, infection, nerve damage, paralysis, failed block, inadequate analgesia, allergic reaction, high spinal, itching and headache were discussed and the patient wished to proceed.   Mary Judd, MD    

## 2014-11-03 NOTE — Op Note (Signed)
Cesarean Section Procedure Note   [name]   11/03/2014  Indications: Scheduled Proceedure/Maternal Request   Pre-operative Diagnosis: Previous C-section, Desires sterilization [id number].   Post-operative Diagnosis: Same   Procedure:  Repeat LTCS and Bilateral Partial Salpingectomy  Surgeon: Sheyann Sulton A  Assistants: Frederico Hamman.  Anesthesia: spinal  Procedure Details:  The patient was seen in the Holding Room. The risks, benefits, complications, treatment options, and expected outcomes were discussed with the patient. The patient concurred with the proposed plan, giving informed consent. The patient was identified as [name] and the procedure verified as C-Section Delivery. A Time Out was held and the above information confirmed.  After induction of anesthesia, the patient was draped and prepped in the usual sterile manner. A transverse incision was made and carried down through the subcutaneous tissue to the fascia. The fascial incision was made and extended transversely. The fascia was separated from the underlying rectus tissue superiorly and inferiorly. The peritoneum was identified and entered. The peritoneal incision was extended longitudinally. The utero-vesical peritoneal reflection was incised transversely and the bladder flap was bluntly freed from the lower uterine segment. A low transverse uterine incision was made. Delivered from cephalic presentation was a 3600 gram living newborn female infant(s). APGAR (1 MIN): 2   APGAR (5 MINS): 9   APGAR (10 MINS):    A cord ph was not sent. The umbilical cord was clamped and cut cord. A sample was obtained for evaluation. The placenta was removed Intact and appeared normal.  Uterine atony and increased bleeding accompanied removal of the placenta but responded well to uterine massage, IV pitocin and IM methergine respectively.  The uterus firmed up nicely and increased bleeding ceased.  The uterine incision was then  closed with running locked sutures of 0 Monocryl. A second imbricating layer of the same suture was placed.  Hemostasis was observed.  Attention then turned above to tubal ligation procedure.  The left fallopian tube grasped in the isthmic area with Babcock clamp and followed from the cornu of the tube to the fimbria; and a knuckle of tube beneath the Babcock clamp was suture ligated through the mesosalpinx with 0 Plain suture.  The knuckle of tube beneath the clamp was excised and submitted to pathology.  Hemostasis was observed, and the tubal stumps were place back in their normal anatomic position.  The same procedure was performed on the opposite side without complications. The paracolic gutters were irrigated. The parieto peritoneum was closed in a running fashion with 2-0 Vicryl.  The fascia was then reapproximated with running sutures of 0 Vicryl.  The skin was closed with suture of 4-0 Monocryl.  Instrument, sponge, and needle counts were correct prior to the abdominal closure and were correct at the conclusion of the case.    Findings: Normal uterus, ovaries and fallopian tubes   Estimated Blood Loss:  1533ml  Total IV Fluids: 3548ml   Urine Output: 60CC OF clear urine  Specimens: Placenta to pathology  Complications: no complications  Disposition: PACU - hemodynamically stable.  Maternal Condition: stable   Baby condition / location:  Couplet care / Skin to Skin    Signed: Surgeon(s): Shelly Bombard, MD Frederico Hamman, MD

## 2014-11-03 NOTE — Transfer of Care (Signed)
Immediate Anesthesia Transfer of Care Note  Patient: [name]  Procedure(s) Performed: Procedure(s): CESAREAN SECTION WITH BILATERAL TUBAL LIGATION (Bilateral)  Patient Location: PACU  Anesthesia Type:Spinal  Level of Consciousness: awake, alert , oriented and patient cooperative  Airway & Oxygen Therapy: Patient Spontanous Breathing  Post-op Assessment: Report given to RN and Post -op Vital signs reviewed and stable  Post vital signs: Reviewed and stable  Last Vitals:  Filed Vitals:   11/03/14 1017  BP: 162/93  Temp: 37 C  Resp: 20    Complications: No apparent anesthesia complications

## 2014-11-03 NOTE — Lactation Note (Signed)
This note was copied from the chart of Marilyn Yaslene Lindamood. Lactation Consultation Note  First time breastfeeding mom is eager to try and BF this baby. She is 8 hours old and mom is having difficulty handling her related to equipment (IV and pulse ox). Mom has large flat nipples.  I assisted her in a laid back  Position and FB hold.  Baby opens her mouth wide but is humping her tongue posteriorly.  This improved as we continued trying.  Her tongue is square with extension. She was fed drops of colostrum and had brief latches.  She fell asleep after about 15 minutes. I encouraged mom to continue working with baby.  Hand expression was taught.  Aware of support groups and outpatient services. Patient Name: [name] HENID'P Date: 11/03/2014 Reason for consult: Initial assessment   Maternal Data Has patient been taught Hand Expression?: Yes Does the patient have breastfeeding experience prior to this delivery?: No  Feeding Feeding Type: Breast Fed  LATCH Score/Interventions Latch: Repeated attempts needed to sustain latch, nipple held in mouth throughout feeding, stimulation needed to elicit sucking reflex. Intervention(s): Breast compression;Assist with latch;Adjust position  Audible Swallowing: None  Type of Nipple: Flat  Comfort (Breast/Nipple): Soft / non-tender     Hold (Positioning): Assistance needed to correctly position infant at breast and maintain latch.  LATCH Score: 5  Lactation Tools Discussed/Used     Consult Status Consult Status: Follow-up Date: 11/04/14 Follow-up type: In-patient    [name] 11/03/2014, 8:59 PM

## 2014-11-03 NOTE — Consult Note (Signed)
Neonatology Note:   Attendance at C-section:    I was asked by Dr. Jodi Mourning to attend this repeat C/S at term. The mother is a G7P2A4 A pos, GBS neg with an uncomplicated pregnancy. ROM at delivery, fluid clear. Delivered footling breech. Infant was floppy and apneic with a HR of about 60. We bulb suctioned and got a large amount of clear fluid from the nares and throat, then gave vigorous stimulation, but she did not breathe. We applied PPV for about 1-1.5 minutes, with increase in HR and improvement in color. We placed a pulse oximeter and the O2 saturation was about 70% in room air, so we gave BBO2. We were unable to wean the baby to room air. She had good, equal breath sounds that were clear to ausc., without distress and without heart murmurs. I spoke with both parents about the above. Shown to mother in DR, then transported to the CN where she can be observed as she completes neonatal transition, with her father in attendance. Ap 2/9. To CN to care of Pediatrician.   Real Cons, MD

## 2014-11-04 LAB — CBC
HCT: 25.6 % — ABNORMAL LOW (ref 36.0–46.0)
Hemoglobin: 8.6 g/dL — ABNORMAL LOW (ref 12.0–15.0)
MCH: 32 pg (ref 26.0–34.0)
MCHC: 33.6 g/dL (ref 30.0–36.0)
MCV: 95.2 fL (ref 78.0–100.0)
PLATELETS: 162 10*3/uL (ref 150–400)
RBC: 2.69 MIL/uL — ABNORMAL LOW (ref 3.87–5.11)
RDW: 15.3 % (ref 11.5–15.5)
WBC: 13.2 10*3/uL — AB (ref 4.0–10.5)

## 2014-11-04 LAB — RPR: RPR Ser Ql: NONREACTIVE

## 2014-11-04 NOTE — Lactation Note (Signed)
This note was copied from the chart of Marilyn Luverna Degenhart. Lactation Consultation Note  Patient Name: [name] WGNFA'O Date: 11/04/2014   Baby 29 hours of life. Mom states that she is just wanting to continue with bottle/formula-feeding because nursing is too hard. Mom has a DEBP in room, but states that she has never used it. Enc mom to ask for assistance if she changes her mind.   Maternal Data    Feeding Feeding Type: Bottle Fed - Formula Nipple Type: Slow - flow  LATCH Score/Interventions                      Lactation Tools Discussed/Used     Consult Status      Inocente Salles 11/04/2014, 5:18 PM

## 2014-11-04 NOTE — Addendum Note (Signed)
Addendum  created 11/04/14 1337 by Asher Muir, CRNA   Modules edited: Notes Section   Notes Section:  File: [id number]

## 2014-11-04 NOTE — Anesthesia Postprocedure Evaluation (Signed)
Anesthesia Post Note  Patient: [name]  Procedure(s) Performed: Procedure(s) (LRB): CESAREAN SECTION WITH BILATERAL TUBAL LIGATION (Bilateral)  Anesthesia type: Spinal  Patient location: Mother/Baby  Post pain: Pain level controlled  Post assessment: Post-op Vital signs reviewed  Last Vitals:  Filed Vitals:   11/04/14 0510  BP: 133/59  Pulse: 83  Temp: 37 C  Resp: 18    Post vital signs: Reviewed  Level of consciousness: awake  Complications: No apparent anesthesia complications

## 2014-11-04 NOTE — Progress Notes (Signed)
Patient ID: [name], female   DOB: 03/27/1981, 34 y.o.   MRN: [id number] Postop day 1 Blood pressure 1:30 care a 59 respiration 18 pulse 83 Dressing clean and dry Lochia moderate Legs negative doing well

## 2014-11-05 NOTE — Progress Notes (Signed)
Patient ID: [name], female   DOB: 25-May-1981, 34 y.o.   MRN: [id number] Postop day 2 Blood pressure 128/59 Pulse 82 Abdomen soft dressing dry Legs negative doing well

## 2014-11-05 NOTE — Discharge Instructions (Signed)
Iron-Rich Diet An iron-rich diet contains foods that are good sources of iron. Iron is an important mineral that helps your body produce hemoglobin. Hemoglobin is a protein in red blood cells that carries oxygen to the body's tissues. Sometimes, the iron level in your blood can be low. This may be caused by:  A lack of iron in your diet.  Blood loss.  Times of growth, such as during pregnancy or during a child's growth and development. Low levels of iron can cause a decrease in the number of red blood cells. This can result in iron deficiency anemia. Iron deficiency anemia symptoms include:  Tiredness.  Weakness.  Irritability.  Increased chance of infection. Here are some recommendations for daily iron intake:  Males older than 36 years of age need 8 mg of iron per day.  Women ages 19 to 50 need 18 mg of iron per day.  Pregnant women need 27 mg of iron per day, and women who are over 19 years of age and breastfeeding need 9 mg of iron per day.  Women over the age of 50 need 8 mg of iron per day. SOURCES OF IRON There are 2 types of iron that are found in food: heme iron and nonheme iron. Heme iron is absorbed by the body better than nonheme iron. Heme iron is found in meat, poultry, and fish. Nonheme iron is found in grains, beans, and vegetables. Heme Iron Sources Food / Iron (mg)  Chicken liver, 3 oz (85 g)/ 10 mg  Beef liver, 3 oz (85 g)/ 5.5 mg  Oysters, 3 oz (85 g)/ 8 mg  Beef, 3 oz (85 g)/ 2 to 3 mg  Shrimp, 3 oz (85 g)/ 2.8 mg  Turkey, 3 oz (85 g)/ 2 mg  Chicken, 3 oz (85 g) / 1 mg  Fish (tuna, halibut), 3 oz (85 g)/ 1 mg  Pork, 3 oz (85 g)/ 0.9 mg Nonheme Iron Sources Food / Iron (mg)  Ready-to-eat breakfast cereal, iron-fortified / 3.9 to 7 mg  Tofu,  cup / 3.4 mg  Kidney beans,  cup / 2.6 mg  Baked potato with skin / 2.7 mg  Asparagus,  cup / 2.2 mg  Avocado / 2 mg  Dried peaches,  cup / 1.6 mg  Raisins,  cup / 1.5 mg  Soy milk, 1 cup  / 1.5 mg  Whole-wheat bread, 1 slice / 1.2 mg  Spinach, 1 cup / 0.8 mg  Broccoli,  cup / 0.6 mg IRON ABSORPTION Certain foods can decrease the body's absorption of iron. Try to avoid these foods and beverages while eating meals with iron-containing foods:  Coffee.  Tea.  Fiber.  Soy. Foods containing vitamin C can help increase the amount of iron your body absorbs from iron sources, especially from nonheme sources. Eat foods with vitamin C along with iron-containing foods to increase your iron absorption. Foods that are high in vitamin C include many fruits and vegetables. Some good sources are:  Fresh orange juice.  Oranges.  Strawberries.  Mangoes.  Grapefruit.  Red bell peppers.  Green bell peppers.  Broccoli.  Potatoes with skin.  Tomato juice. Document Released: 02/11/2005 Document Revised: 09/22/2011 Document Reviewed: 12/19/2010 Exit[employer] Patient Information 2015 [employer]. This information is not intended to replace advice given to you by your health care provider. Make sure you discuss any questions you have with your health care provider.  

## 2014-11-06 ENCOUNTER — Other Ambulatory Visit: Payer: Self-pay | Admitting: Obstetrics

## 2014-11-06 ENCOUNTER — Encounter (HOSPITAL_COMMUNITY): Payer: Self-pay | Admitting: Obstetrics

## 2014-11-06 DIAGNOSIS — D509 Iron deficiency anemia, unspecified: Secondary | ICD-10-CM

## 2014-11-06 MED ORDER — IBUPROFEN 600 MG PO TABS: 600.0000 mg | ORAL_TABLET | Freq: Four times a day (QID) | ORAL | Status: DC | PRN

## 2014-11-06 MED ORDER — FUSION PLUS PO CAPS: 1.0000 | ORAL_CAPSULE | Freq: Every day | ORAL | Status: DC

## 2014-11-06 MED ORDER — OXYCODONE-ACETAMINOPHEN 10-325 MG PO TABS: 1.0000 | ORAL_TABLET | Freq: Four times a day (QID) | ORAL | Status: DC | PRN

## 2014-11-06 MED ORDER — OXYCODONE-ACETAMINOPHEN 5-325 MG PO TABS: 1.0000 | ORAL_TABLET | ORAL | Status: DC | PRN

## 2014-11-06 NOTE — Progress Notes (Signed)
Abdominal binder given to patient at Dr. Jacelyn Grip request this morning.   [name], Marilyn Villella M

## 2014-11-06 NOTE — Lactation Note (Signed)
This note was copied from the chart of [name]. Lactation Consultation Note  Mom's milk has come to volume and her breasts are very full.  She is interested in softening her breasts but not providing milk long term.  I discussed her options for feeding and how to manage her breasts.  I encouraged providing breast milk for her baby.  She has expressed 8 oz in the past hour.  She will contact us if she needs further assistance.  Patient Name: [name] NOTRR'N Date: 11/06/2014     Maternal Data    Feeding Feeding Type: Bottle Fed - Formula Nipple Type: Slow - flow  LATCH Score/Interventions                      Lactation Tools Discussed/Used     Consult Status      [name] 11/06/2014, 10:25 AM

## 2014-11-06 NOTE — Progress Notes (Signed)
Subjective: Postpartum Day 3: Cesarean Delivery Patient reports tolerating PO, + flatus, + BM and no problems voiding.    Objective: Vital signs in last 24 hours: Temp:  [98.6 F (37 C)] 98.6 F (37 C) (04/25 0700) Pulse Rate:  [76-93] 76 (04/25 0700) Resp:  [18] 18 (04/25 0700) BP: (122-158)/(66-80) 138/72 mmHg (04/25 0700)  Physical Exam:  General: alert and no distress Lochia: appropriate Uterine Fundus: firm Incision: healing well DVT Evaluation: No evidence of DVT seen on physical exam.   Recent Labs  11/03/14 2000 11/04/14 0613  HGB 9.3* 8.6*  HCT 28.4* 25.6*    Assessment/Plan: Status post Cesarean section. Doing well postoperatively.  Anemia.  Chronic.  Clinically stable.  Iron Rx. Discharge home with standard precautions and return to clinic in 2 weeks.  Vikrant Pryce A 11/06/2014, 7:57 AM

## 2014-11-06 NOTE — Discharge Summary (Signed)
Obstetric Discharge Summary Reason for Admission: cesarean section Prenatal Procedures: ultrasound Intrapartum Procedures: cesarean: low cervical, transverse Postpartum Procedures: none Complications-Operative and Postpartum: none HEMOGLOBIN  Date Value Ref Range Status  11/04/2014 8.6* 12.0 - 15.0 g/dL Final   HCT  Date Value Ref Range Status  11/04/2014 25.6* 36.0 - 46.0 % Final    Physical Exam:  General: alert and no distress Lochia: appropriate Uterine Fundus: firm Incision: healing well DVT Evaluation: No evidence of DVT seen on physical exam.  Discharge Diagnoses: Term Pregnancy-delivered  Discharge Information: Date: 11/06/2014 Activity: pelvic rest Diet: routine Medications: PNV, Ibuprofen, Colace, Iron and Percocet Condition: stable Instructions: refer to practice specific booklet Discharge to: home Follow-up Information    Follow up with [name], MD In 2 weeks.   Specialty:  Obstetrics and Gynecology   Contact information:   [address] [place] [address] [phone]       Newborn Data: Live born female  Birth Weight: 7 lb 15 oz (3600 g) APGAR: 2, 9  Home with mother.  [name] A 11/06/2014, 8:05 AM

## 2014-11-07 NOTE — Progress Notes (Signed)
Ur chart review completed.  

## 2014-11-10 ENCOUNTER — Inpatient Hospital Stay (HOSPITAL_COMMUNITY)
Admission: AD | Admit: 2014-11-10 | Discharge: 2014-11-15 | DRG: 776 | Disposition: A | Discharge status: Home / Self Care | Payer: Medicaid Other | Source: Ambulatory Visit | Attending: Obstetrics | Admitting: Obstetrics

## 2014-11-10 ENCOUNTER — Encounter (HOSPITAL_COMMUNITY): Payer: Self-pay | Admitting: *Deleted

## 2014-11-10 ENCOUNTER — Encounter: Payer: Self-pay | Admitting: Obstetrics

## 2014-11-10 ENCOUNTER — Ambulatory Visit (INDEPENDENT_AMBULATORY_CARE_PROVIDER_SITE_OTHER): Payer: Medicaid Other | Admitting: Obstetrics

## 2014-11-10 ENCOUNTER — Inpatient Hospital Stay (HOSPITAL_COMMUNITY)
Admission: AD | Admit: 2014-11-10 | Payer: No Typology Code available for payment source | Source: Ambulatory Visit | Admitting: Obstetrics & Gynecology

## 2014-11-10 VITALS — BP 178/92 | HR 73 | Ht 64.0 in

## 2014-11-10 DIAGNOSIS — Z888 Allergy status to other drugs, medicaments and biological substances status: Secondary | ICD-10-CM

## 2014-11-10 DIAGNOSIS — D509 Iron deficiency anemia, unspecified: Secondary | ICD-10-CM | POA: Diagnosis present

## 2014-11-10 DIAGNOSIS — O9903 Anemia complicating the puerperium: Secondary | ICD-10-CM | POA: Diagnosis present

## 2014-11-10 DIAGNOSIS — F329 Major depressive disorder, single episode, unspecified: Secondary | ICD-10-CM | POA: Diagnosis present

## 2014-11-10 DIAGNOSIS — Z881 Allergy status to other antibiotic agents status: Secondary | ICD-10-CM | POA: Diagnosis not present

## 2014-11-10 DIAGNOSIS — Z79899 Other long term (current) drug therapy: Secondary | ICD-10-CM | POA: Diagnosis not present

## 2014-11-10 DIAGNOSIS — Z87891 Personal history of nicotine dependence: Secondary | ICD-10-CM | POA: Diagnosis not present

## 2014-11-10 DIAGNOSIS — O165 Unspecified maternal hypertension, complicating the puerperium: Secondary | ICD-10-CM

## 2014-11-10 DIAGNOSIS — O1493 Unspecified pre-eclampsia, third trimester: Secondary | ICD-10-CM

## 2014-11-10 DIAGNOSIS — O99345 Other mental disorders complicating the puerperium: Secondary | ICD-10-CM | POA: Diagnosis present

## 2014-11-10 DIAGNOSIS — O9089 Other complications of the puerperium, not elsewhere classified: Principal | ICD-10-CM | POA: Diagnosis present

## 2014-11-10 DIAGNOSIS — F419 Anxiety disorder, unspecified: Secondary | ICD-10-CM | POA: Diagnosis present

## 2014-11-10 DIAGNOSIS — O169 Unspecified maternal hypertension, unspecified trimester: Secondary | ICD-10-CM

## 2014-11-10 DIAGNOSIS — O1490 Unspecified pre-eclampsia, unspecified trimester: Secondary | ICD-10-CM | POA: Diagnosis not present

## 2014-11-10 LAB — CBC WITH DIFFERENTIAL/PLATELET
BASOS ABS: 0 10*3/uL (ref 0.0–0.1)
Basophils Relative: 0 % (ref 0–1)
EOS ABS: 0.3 10*3/uL (ref 0.0–0.7)
Eosinophils Relative: 4 % (ref 0–5)
HCT: 23 % — ABNORMAL LOW (ref 36.0–46.0)
HEMOGLOBIN: 7.7 g/dL — AB (ref 12.0–15.0)
LYMPHS PCT: 18 % (ref 12–46)
Lymphs Abs: 1.6 10*3/uL (ref 0.7–4.0)
MCH: 32.4 pg (ref 26.0–34.0)
MCHC: 33.5 g/dL (ref 30.0–36.0)
MCV: 96.6 fL (ref 78.0–100.0)
MONO ABS: 0.4 10*3/uL (ref 0.1–1.0)
Monocytes Relative: 4 % (ref 3–12)
Neutro Abs: 6.7 10*3/uL (ref 1.7–7.7)
Neutrophils Relative %: 74 % (ref 43–77)
PLATELETS: 220 10*3/uL (ref 150–400)
RBC: 2.38 MIL/uL — ABNORMAL LOW (ref 3.87–5.11)
RDW: 16.2 % — AB (ref 11.5–15.5)
WBC: 9 10*3/uL (ref 4.0–10.5)

## 2014-11-10 LAB — COMPREHENSIVE METABOLIC PANEL
ALK PHOS: 90 U/L (ref 39–117)
ALT: 79 U/L — AB (ref 0–35)
ANION GAP: 8 (ref 5–15)
AST: 61 U/L — ABNORMAL HIGH (ref 0–37)
Albumin: 2.7 g/dL — ABNORMAL LOW (ref 3.5–5.2)
BUN: 22 mg/dL (ref 6–23)
CHLORIDE: 108 mmol/L (ref 96–112)
CO2: 26 mmol/L (ref 19–32)
Calcium: 8.2 mg/dL — ABNORMAL LOW (ref 8.4–10.5)
Creatinine, Ser: 0.5 mg/dL (ref 0.50–1.10)
GFR calc Af Amer: >90 mL/min (ref >90)
Glucose, Bld: 85 mg/dL (ref 70–99)
Potassium: 3.6 mmol/L (ref 3.5–5.1)
SODIUM: 142 mmol/L (ref 135–145)
Total Bilirubin: 0.1 mg/dL — ABNORMAL LOW (ref 0.3–1.2)
Total Protein: 5.8 g/dL — ABNORMAL LOW (ref 6.0–8.3)

## 2014-11-10 LAB — URINALYSIS, ROUTINE W REFLEX MICROSCOPIC
Bilirubin Urine: NEGATIVE
Glucose, UA: NEGATIVE mg/dL
Ketones, ur: NEGATIVE mg/dL
Leukocytes, UA: NEGATIVE
Nitrite: NEGATIVE
PROTEIN: NEGATIVE mg/dL
SPECIFIC GRAVITY, URINE: 1.025 (ref 1.005–1.030)
UROBILINOGEN UA: 1 mg/dL (ref 0.0–1.0)
pH: 6.5 (ref 5.0–8.0)

## 2014-11-10 LAB — URINE MICROSCOPIC-ADD ON

## 2014-11-10 LAB — LACTATE DEHYDROGENASE: LDH: 353 U/L — ABNORMAL HIGH (ref 94–250)

## 2014-11-10 LAB — PROTEIN / CREATININE RATIO, URINE
Creatinine, Urine: 145 mg/dL
PROTEIN CREATININE RATIO: 0.12 mg/mg{creat} (ref 0.00–0.15)
TOTAL PROTEIN, URINE: 17 mg/dL

## 2014-11-10 LAB — URIC ACID: URIC ACID, SERUM: 3.3 mg/dL (ref 2.4–7.0)

## 2014-11-10 MED ORDER — ZOLPIDEM TARTRATE 5 MG PO TABS: 5.0000 mg | ORAL_TABLET | Freq: Every evening | ORAL | Status: DC | PRN

## 2014-11-10 MED ORDER — OXYCODONE-ACETAMINOPHEN 10-325 MG PO TABS: 1.0000 | ORAL_TABLET | ORAL | Status: DC | PRN

## 2014-11-10 MED ORDER — PRENATAL MULTIVITAMIN CH
1.0000 | ORAL_TABLET | Freq: Every day | ORAL | Status: DC
  Administered 2014-11-11 – 2014-11-14 (×4): 1 via ORAL
  Filled 2014-11-10 (×4): qty 1

## 2014-11-10 MED ORDER — LABETALOL HCL 200 MG PO TABS
200.0000 mg | ORAL_TABLET | Freq: Three times a day (TID) | ORAL | Status: DC
  Administered 2014-11-10 – 2014-11-11 (×3): 200 mg via ORAL
  Filled 2014-11-10 (×6): qty 1

## 2014-11-10 MED ORDER — LACTATED RINGERS IV SOLN: INTRAVENOUS | Status: DC

## 2014-11-10 MED ORDER — MAGNESIUM SULFATE 50 % IJ SOLN: 2.0000 g/h | INTRAVENOUS | Status: DC

## 2014-11-10 MED ORDER — LABETALOL HCL 5 MG/ML IV SOLN
80.0000 mg | Freq: Once | INTRAVENOUS | Status: AC
  Administered 2014-11-10: 80 mg via INTRAVENOUS
  Filled 2014-11-10: qty 16

## 2014-11-10 MED ORDER — LABETALOL HCL 5 MG/ML IV SOLN
20.0000 mg | INTRAVENOUS | Status: AC | PRN
  Administered 2014-11-10 (×2): 20 mg via INTRAVENOUS
  Administered 2014-11-10: 40 mg via INTRAVENOUS
  Filled 2014-11-10 (×2): qty 4
  Filled 2014-11-10: qty 8

## 2014-11-10 MED ORDER — FUSION PLUS PO CAPS: 1.0000 | ORAL_CAPSULE | Freq: Every day | ORAL | Status: DC

## 2014-11-10 MED ORDER — ZOLPIDEM TARTRATE 5 MG PO TABS
10.0000 mg | ORAL_TABLET | Freq: Every evening | ORAL | Status: DC | PRN
  Administered 2014-11-10: 10 mg via ORAL
  Filled 2014-11-10 (×2): qty 2

## 2014-11-10 MED ORDER — MAGNESIUM SULFATE 4 GM/100ML IV SOLN: 4.0000 g | Freq: Once | INTRAVENOUS | Status: DC

## 2014-11-10 MED ORDER — LABETALOL HCL 200 MG PO TABS
200.0000 mg | ORAL_TABLET | Freq: Once | ORAL | Status: AC
  Administered 2014-11-10: 200 mg via ORAL

## 2014-11-10 MED ORDER — OXYCODONE-ACETAMINOPHEN 5-325 MG PO TABS
1.0000 | ORAL_TABLET | ORAL | Status: DC | PRN
  Administered 2014-11-10 – 2014-11-11 (×2): 1 via ORAL
  Filled 2014-11-10 (×4): qty 1

## 2014-11-10 MED ORDER — OXYCODONE HCL 5 MG PO TABS
5.0000 mg | ORAL_TABLET | ORAL | Status: DC | PRN
  Administered 2014-11-10 – 2014-11-11 (×3): 5 mg via ORAL
  Filled 2014-11-10 (×4): qty 1

## 2014-11-10 MED ORDER — LORATADINE 10 MG PO TABS
10.0000 mg | ORAL_TABLET | Freq: Every day | ORAL | Status: DC
  Administered 2014-11-10 – 2014-11-15 (×6): 10 mg via ORAL
  Filled 2014-11-10 (×6): qty 1

## 2014-11-10 MED ORDER — ACETAMINOPHEN 500 MG PO TABS
1000.0000 mg | ORAL_TABLET | ORAL | Status: AC
Start: 2014-11-10 — End: 2014-11-10
  Administered 2014-11-10: 1000 mg via ORAL
  Filled 2014-11-10: qty 2

## 2014-11-10 MED ORDER — MAGNESIUM SULFATE 50 % IJ SOLN
2.0000 g/h | INTRAMUSCULAR | Status: DC
  Administered 2014-11-10 – 2014-11-11 (×2): 2 g/h via INTRAVENOUS
  Filled 2014-11-10 (×2): qty 80

## 2014-11-10 MED ORDER — PANTOPRAZOLE SODIUM 40 MG PO TBEC
40.0000 mg | DELAYED_RELEASE_TABLET | Freq: Every day | ORAL | Status: DC
  Administered 2014-11-10 – 2014-11-15 (×6): 40 mg via ORAL
  Filled 2014-11-10 (×6): qty 1

## 2014-11-10 MED ORDER — LACTATED RINGERS IV SOLN
INTRAVENOUS | Status: DC
  Administered 2014-11-11: 09:00:00 via INTRAVENOUS

## 2014-11-10 MED ORDER — LABETALOL HCL 100 MG PO TABS: 200.0000 mg | ORAL_TABLET | Freq: Three times a day (TID) | ORAL | Status: DC

## 2014-11-10 MED ORDER — MAGNESIUM SULFATE BOLUS VIA INFUSION
4.0000 g | Freq: Once | INTRAVENOUS | Status: AC
  Administered 2014-11-10: 4 g via INTRAVENOUS
  Filled 2014-11-10: qty 500

## 2014-11-10 MED ORDER — HYDRALAZINE HCL 20 MG/ML IJ SOLN
10.0000 mg | Freq: Once | INTRAMUSCULAR | Status: AC | PRN
  Administered 2014-11-11: 10 mg via INTRAVENOUS
  Filled 2014-11-10: qty 1

## 2014-11-10 NOTE — MAU Note (Signed)
Being evaluated for [facility]

## 2014-11-10 NOTE — H&P (Signed)
[name] is an 34 y.o. female. Patient is 1 week postpartum and presented to the office today with c/o headache today and worsening LE edema.  Evaluation in the office revealed pitting LE edema and elevated BP.  Sent to [facility] for further evaluation.  BP continued to be elevated and [facility] labs were positive for preeclampsia.     Past Medical History  Diagnosis Date  . Depression     never been treated , "probably got some "  . Anxiety   . XBDZHGDJ(242.6)     Past Surgical History  Procedure Laterality Date  . Dilation and curettage of uterus    . Therapeutic abortion    . Cesarean section    . Cholecystectomy, laparoscopic    . Cesarean section with bilateral tubal ligation Bilateral 11/03/2014    Procedure: CESAREAN SECTION WITH BILATERAL TUBAL LIGATION;  Surgeon: Shelly Bombard, MD;  Location: [place] [facility];  Service: Obstetrics;  Laterality: Bilateral;    Family History  Problem Relation Age of Onset  . Diabetes Father   . Hypertension Father   . Cancer Maternal Aunt     breast    Social History:  reports that she has quit smoking. Her smoking use included Cigarettes. She quit smokeless tobacco use about 7 months ago. She reports that she does not drink alcohol or use illicit drugs.  Allergies:  Allergies  Allergen Reactions  . Levofloxacin Itching  . Sertraline Other (See Comments)    Pt says causes very crazy dreams--Does not want to take    Prescriptions prior to admission  Medication Sig Dispense Refill Last Dose  . diphenhydrAMINE (BENADRYL) 25 MG tablet Take 25 mg by mouth every 6 (six) hours as needed for allergies or sleep.   Past Week at Unknown time  . ibuprofen (ADVIL,MOTRIN) 600 MG tablet Take 1 tablet (600 mg total) by mouth every 6 (six) hours as needed for mild pain. 30 tablet 5 11/10/2014 at Unknown time  . loratadine (CLARITIN) 10 MG tablet Take 1 tablet (10 mg total) by mouth daily. 30 tablet 11 11/09/2014 at Unknown time  . omeprazole (PRILOSEC) 20 MG capsule  Take 1 capsule (20 mg total) by mouth 2 (two) times daily before a meal. 60 capsule 5 11/09/2014 at Unknown time  . oxyCODONE-acetaminophen (PERCOCET) 10-325 MG per tablet Take 1 tablet by mouth every 6 (six) hours as needed for pain. 40 tablet 0 11/10/2014 at Unknown time  . Prenat-FeCbn-FeAspGl-FA-Omega (OB COMPLETE PETITE) 35-5-1-200 MG CAPS Take 1 capsule by mouth daily. 30 capsule 11 11/09/2014 at Unknown time  . butalbital-acetaminophen-caffeine (FIORICET) 50-325-40 MG per tablet Take 1-2 tablets by mouth every 6 (six) hours as needed for headache. (Patient not taking: Reported on 11/10/2014) 40 tablet 2 Not Taking  . Iron-FA-B Cmp-C-Biot-Probiotic (FUSION PLUS) CAPS Take 1 capsule by mouth daily before breakfast. (Patient not taking: Reported on 11/10/2014) 30 capsule 5 Not Taking  . ondansetron (ZOFRAN) 8 MG tablet Take 1 tablet (8 mg total) by mouth every 8 (eight) hours as needed for nausea or vomiting. (Patient not taking: Reported on 11/10/2014) 30 tablet 2 Not Taking    Review of Systems  Neurological: Positive for headaches.  All other systems reviewed and are negative.   Blood pressure 176/94, pulse 63, temperature 98.8 F (37.1 C), temperature source Oral, resp. rate 18, last menstrual period 02/03/2014, unknown if currently breastfeeding. Physical Exam  Nursing note and vitals reviewed. Constitutional: She is oriented to person, place, and time. She appears well-developed  and well-nourished.  HENT:  Head: Normocephalic and atraumatic.  Eyes: Conjunctivae are normal. Pupils are equal, round, and reactive to light.  Neck: Normal range of motion. Neck supple.  Cardiovascular: Normal rate and regular rhythm.   Respiratory: Effort normal and breath sounds normal.  GI: Soft. Bowel sounds are normal.  Musculoskeletal: Normal range of motion.  Neurological: She is alert and oriented to person, place, and time. She has normal reflexes.  Skin: Skin is warm and dry.  Psychiatric: She has  a normal mood and affect. Her behavior is normal. Judgment and thought content normal.    Results for orders placed or performed during the hospital encounter of 11/10/14 (from the past 24 hour(s))  Urinalysis, Routine w reflex microscopic     Status: Abnormal   Collection Time: 11/10/14  2:30 PM  Result Value Ref Range   Color, Urine YELLOW YELLOW   APPearance CLEAR CLEAR   Specific Gravity, Urine 1.025 1.005 - 1.030   pH 6.5 5.0 - 8.0   Glucose, UA NEGATIVE NEGATIVE mg/dL   Hgb urine dipstick LARGE (A) NEGATIVE   Bilirubin Urine NEGATIVE NEGATIVE   Ketones, ur NEGATIVE NEGATIVE mg/dL   Protein, ur NEGATIVE NEGATIVE mg/dL   Urobilinogen, UA 1.0 0.0 - 1.0 mg/dL   Nitrite NEGATIVE NEGATIVE   Leukocytes, UA NEGATIVE NEGATIVE  Protein / creatinine ratio, urine     Status: None   Collection Time: 11/10/14  2:30 PM  Result Value Ref Range   Creatinine, Urine 145.00 mg/dL   Total Protein, Urine 17 mg/dL   Protein Creatinine Ratio 0.12 0.00 - 0.15 mg/mg[Cre]  Urine microscopic-add on     Status: Abnormal   Collection Time: 11/10/14  2:30 PM  Result Value Ref Range   Squamous Epithelial / LPF FEW (A) RARE   WBC, UA 3-6 <3 WBC/hpf   RBC / HPF 3-6 <3 RBC/hpf   Bacteria, UA FEW (A) RARE   Urine-Other MUCOUS PRESENT   Comprehensive metabolic panel     Status: Abnormal   Collection Time: 11/10/14  3:20 PM  Result Value Ref Range   Sodium 142 135 - 145 mmol/L   Potassium 3.6 3.5 - 5.1 mmol/L   Chloride 108 96 - 112 mmol/L   CO2 26 19 - 32 mmol/L   Glucose, Bld 85 70 - 99 mg/dL   BUN 22 6 - 23 mg/dL   Creatinine, Ser 0.50 0.50 - 1.10 mg/dL   Calcium 8.2 (L) 8.4 - 10.5 mg/dL   Total Protein 5.8 (L) 6.0 - 8.3 g/dL   Albumin 2.7 (L) 3.5 - 5.2 g/dL   AST 61 (H) 0 - 37 U/L   ALT 79 (H) 0 - 35 U/L   Alkaline Phosphatase 90 39 - 117 U/L   Total Bilirubin 0.1 (L) 0.3 - 1.2 mg/dL   GFR calc non Af Amer >90 >90 mL/min   GFR calc Af Amer >90 >90 mL/min   Anion gap 8 5 - 15  CBC with  Differential     Status: Abnormal   Collection Time: 11/10/14  3:20 PM  Result Value Ref Range   WBC 9.0 4.0 - 10.5 K/uL   RBC 2.38 (L) 3.87 - 5.11 MIL/uL   Hemoglobin 7.7 (L) 12.0 - 15.0 g/dL   HCT 23.0 (L) 36.0 - 46.0 %   MCV 96.6 78.0 - 100.0 fL   MCH 32.4 26.0 - 34.0 pg   MCHC 33.5 30.0 - 36.0 g/dL   RDW 16.2 (H) 11.5 - 15.5 %  Platelets 220 150 - 400 K/uL   Neutrophils Relative % 74 43 - 77 %   Neutro Abs 6.7 1.7 - 7.7 K/uL   Lymphocytes Relative 18 12 - 46 %   Lymphs Abs 1.6 0.7 - 4.0 K/uL   Monocytes Relative 4 3 - 12 %   Monocytes Absolute 0.4 0.1 - 1.0 K/uL   Eosinophils Relative 4 0 - 5 %   Eosinophils Absolute 0.3 0.0 - 0.7 K/uL   Basophils Relative 0 0 - 1 %   Basophils Absolute 0.0 0.0 - 0.1 K/uL  Lactate dehydrogenase     Status: Abnormal   Collection Time: 11/10/14  3:20 PM  Result Value Ref Range   LDH 353 (H) 94 - 250 U/L  Uric acid     Status: None   Collection Time: 11/10/14  3:20 PM  Result Value Ref Range   Uric Acid, Serum 3.3 2.4 - 7.0 mg/dL    No results found.  Assessment/Plan:        Postpartum preeclampsia.  Admit.  Magnesium Sulfate seizure prophylaxis.  Labetalol for BP control.   Alayja Armas A 11/10/2014, 4:55 PM

## 2014-11-10 NOTE — Progress Notes (Signed)
Orders obtained for Mag and admission to AICU

## 2014-11-10 NOTE — Progress Notes (Signed)
   11/10/14 2220  Vitals  BP (!) 160/88 mmHg  MAP (mmHg) 105  BP Location Left Arm  BP Method Automatic  Patient Position (if appropriate) Lying  Scheduled Labetalol 200 mg administered. We will recheck B/P in 1 hour. MD aware of B/P and medication intervention.

## 2014-11-10 NOTE — Progress Notes (Signed)
   11/10/14 2126  Vitals  BP (!) 169/86 mmHg  MAP (mmHg) 107  BP Location Left Arm  BP Method Automatic  Patient Position (if appropriate) Lying  Pulse Rate 76  Oxygen Therapy  SpO2 96 %  O2 Device Room Air  Labetalol 20 mg IV PRN , administered as ordered. We will recheck B/P in 10 minutes.

## 2014-11-10 NOTE — MAU Note (Signed)
Sent from Dr Jacelyn Grip office for [facility] eval; has a headache that started this morning and does have blurred vision; had pre-eclampsia with 2nd pregnancy but not with last pregnancy; extreme edema from knees down;

## 2014-11-10 NOTE — Progress Notes (Signed)
   11/10/14 2200  Vitals  BP (!) 159/89 mmHg  MAP (mmHg) 107  BP Location Left Arm  BP Method Automatic  Patient Position (if appropriate) Lying  Pulse Rate 73  Oxygen Therapy  SpO2 97 %  O2 Device Room Air  Re check B/P

## 2014-11-10 NOTE — MAU Provider Note (Signed)
History     CSN: [id number]  Arrival date and time: 11/10/14 1421   First Provider Initiated Contact with Patient 11/10/14 1455      Chief Complaint  Patient presents with  . Postpartum Complications   HPI [name] 34 y.o. [id number] whom is 7 days postpartum presents for evaluation after blood pressure was found to be elevated in OB office (175/92).  She has had swelling since her discharge from hospital.  She reports her legs are so swollen it hurts to walk.  She has headache now that is 6/10 and she reports a history of headaches - although she states this feels different.  She has blurry vision.  She denies epigastric pain.   OB History    Gravida Para Term Preterm AB TAB SAB Ectopic Multiple Living   8 3 3  0 4 3 1   0 3      Past Medical History  Diagnosis Date  . Depression     never been treated , "probably got some "  . Anxiety   . LGXQJJHE(174.0)     Past Surgical History  Procedure Laterality Date  . Dilation and curettage of uterus    . Therapeutic abortion    . Cesarean section    . Cholecystectomy, laparoscopic    . Cesarean section with bilateral tubal ligation Bilateral 11/03/2014    Procedure: CESAREAN SECTION WITH BILATERAL TUBAL LIGATION;  Surgeon: Shelly Bombard, MD;  Location: [facility];  Service: Obstetrics;  Laterality: Bilateral;    Family History  Problem Relation Age of Onset  . Diabetes Father   . Hypertension Father   . Cancer Maternal Aunt     breast    History  Substance Use Topics  . Smoking status: Former Smoker    Types: Cigarettes  . Smokeless tobacco: Former Systems developer    Quit date: 04/04/2014     Comment: prior to preg  . Alcohol Use: No    Allergies:  Allergies  Allergen Reactions  . Levofloxacin Itching  . Sertraline Other (See Comments)    Pt says causes very crazy dreams--Does not want to take    Prescriptions prior to admission  Medication Sig Dispense Refill Last Dose  . diphenhydrAMINE (BENADRYL) 25 MG tablet Take 25  mg by mouth every 6 (six) hours as needed for allergies or sleep.   Past Week at Unknown time  . ibuprofen (ADVIL,MOTRIN) 600 MG tablet Take 1 tablet (600 mg total) by mouth every 6 (six) hours as needed for mild pain. 30 tablet 5 11/10/2014 at Unknown time  . loratadine (CLARITIN) 10 MG tablet Take 1 tablet (10 mg total) by mouth daily. 30 tablet 11 11/09/2014 at Unknown time  . omeprazole (PRILOSEC) 20 MG capsule Take 1 capsule (20 mg total) by mouth 2 (two) times daily before a meal. 60 capsule 5 11/09/2014 at Unknown time  . oxyCODONE-acetaminophen (PERCOCET) 10-325 MG per tablet Take 1 tablet by mouth every 6 (six) hours as needed for pain. 40 tablet 0 11/10/2014 at Unknown time  . Prenat-FeCbn-FeAspGl-FA-Omega (OB COMPLETE PETITE) 35-5-1-200 MG CAPS Take 1 capsule by mouth daily. 30 capsule 11 11/09/2014 at Unknown time  . butalbital-acetaminophen-caffeine (FIORICET) 50-325-40 MG per tablet Take 1-2 tablets by mouth every 6 (six) hours as needed for headache. (Patient not taking: Reported on 11/10/2014) 40 tablet 2 Not Taking  . Iron-FA-B Cmp-C-Biot-Probiotic (FUSION PLUS) CAPS Take 1 capsule by mouth daily before breakfast. (Patient not taking: Reported on 11/10/2014) 30 capsule 5 Not Taking  .  ondansetron (ZOFRAN) 8 MG tablet Take 1 tablet (8 mg total) by mouth every 8 (eight) hours as needed for nausea or vomiting. (Patient not taking: Reported on 11/10/2014) 30 tablet 2 Not Taking    ROS Pertinent ROS in HPI.  All other systems are negative.   Physical Exam   Blood pressure 165/90, pulse 72, temperature 98.8 F (37.1 C), temperature source Oral, resp. rate 18, last menstrual period 02/03/2014, unknown if currently breastfeeding.  Physical Exam  Constitutional: She is oriented to person, place, and time. She appears well-developed and well-nourished. No distress.  HENT:  Head: Normocephalic and atraumatic.  Eyes: EOM are normal.  Neck: Normal range of motion.  Cardiovascular: Normal rate,  regular rhythm and normal heart sounds.   2+ pitting edema bilaterally  Respiratory: Effort normal and breath sounds normal. No respiratory distress.  GI: Soft. Bowel sounds are normal. She exhibits no distension. There is no tenderness. There is no rebound and no guarding.  Musculoskeletal: Normal range of motion.  Neurological: She is alert and oriented to person, place, and time.  Skin: Skin is warm and dry.  Psychiatric: She has a normal mood and affect.   Results for orders placed or performed during the hospital encounter of 11/10/14 (from the past 24 hour(s))  Urinalysis, Routine w reflex microscopic     Status: Abnormal   Collection Time: 11/10/14  2:30 PM  Result Value Ref Range   Color, Urine YELLOW YELLOW   APPearance CLEAR CLEAR   Specific Gravity, Urine 1.025 1.005 - 1.030   pH 6.5 5.0 - 8.0   Glucose, UA NEGATIVE NEGATIVE mg/dL   Hgb urine dipstick LARGE (A) NEGATIVE   Bilirubin Urine NEGATIVE NEGATIVE   Ketones, ur NEGATIVE NEGATIVE mg/dL   Protein, ur NEGATIVE NEGATIVE mg/dL   Urobilinogen, UA 1.0 0.0 - 1.0 mg/dL   Nitrite NEGATIVE NEGATIVE   Leukocytes, UA NEGATIVE NEGATIVE  Protein / creatinine ratio, urine     Status: None   Collection Time: 11/10/14  2:30 PM  Result Value Ref Range   Creatinine, Urine 145.00 mg/dL   Total Protein, Urine 17 mg/dL   Protein Creatinine Ratio 0.12 0.00 - 0.15 mg/mg[Cre]  Urine microscopic-add on     Status: Abnormal   Collection Time: 11/10/14  2:30 PM  Result Value Ref Range   Squamous Epithelial / LPF FEW (A) RARE   WBC, UA 3-6 <3 WBC/hpf   RBC / HPF 3-6 <3 RBC/hpf   Bacteria, UA FEW (A) RARE   Urine-Other MUCOUS PRESENT   Comprehensive metabolic panel     Status: Abnormal   Collection Time: 11/10/14  3:20 PM  Result Value Ref Range   Sodium 142 135 - 145 mmol/L   Potassium 3.6 3.5 - 5.1 mmol/L   Chloride 108 96 - 112 mmol/L   CO2 26 19 - 32 mmol/L   Glucose, Bld 85 70 - 99 mg/dL   BUN 22 6 - 23 mg/dL   Creatinine,  Ser 0.50 0.50 - 1.10 mg/dL   Calcium 8.2 (L) 8.4 - 10.5 mg/dL   Total Protein 5.8 (L) 6.0 - 8.3 g/dL   Albumin 2.7 (L) 3.5 - 5.2 g/dL   AST 61 (H) 0 - 37 U/L   ALT 79 (H) 0 - 35 U/L   Alkaline Phosphatase 90 39 - 117 U/L   Total Bilirubin 0.1 (L) 0.3 - 1.2 mg/dL   GFR calc non Af Amer >90 >90 mL/min   GFR calc Af Amer >90 >90 mL/min  Anion gap 8 5 - 15  CBC with Differential     Status: Abnormal   Collection Time: 11/10/14  3:20 PM  Result Value Ref Range   WBC 9.0 4.0 - 10.5 K/uL   RBC 2.38 (L) 3.87 - 5.11 MIL/uL   Hemoglobin 7.7 (L) 12.0 - 15.0 g/dL   HCT 23.0 (L) 36.0 - 46.0 %   MCV 96.6 78.0 - 100.0 fL   MCH 32.4 26.0 - 34.0 pg   MCHC 33.5 30.0 - 36.0 g/dL   RDW 16.2 (H) 11.5 - 15.5 %   Platelets 220 150 - 400 K/uL   Neutrophils Relative % 74 43 - 77 %   Neutro Abs 6.7 1.7 - 7.7 K/uL   Lymphocytes Relative 18 12 - 46 %   Lymphs Abs 1.6 0.7 - 4.0 K/uL   Monocytes Relative 4 3 - 12 %   Monocytes Absolute 0.4 0.1 - 1.0 K/uL   Eosinophils Relative 4 0 - 5 %   Eosinophils Absolute 0.3 0.0 - 0.7 K/uL   Basophils Relative 0 0 - 1 %   Basophils Absolute 0.0 0.0 - 0.1 K/uL  Lactate dehydrogenase     Status: Abnormal   Collection Time: 11/10/14  3:20 PM  Result Value Ref Range   LDH 353 (H) 94 - 250 U/L  Uric acid     Status: None   Collection Time: 11/10/14  3:20 PM  Result Value Ref Range   Uric Acid, Serum 3.3 2.4 - 7.0 mg/dL    MAU Course  Procedures  MDM Dr. Jodi Mourning consulted.  BP's are elevated.  Labs are abnormal.  Pt to be admitted by Dr. Jodi Mourning  Assessment and Plan  A: Pre-eclampsia in postpartum  P: Admit  Paticia Stack 11/10/2014, 2:56 PM

## 2014-11-10 NOTE — Progress Notes (Signed)
   11/10/14 2152  Vitals  BP (!) 165/86 mmHg  MAP (mmHg) 107  BP Location Left Arm  BP Method Automatic  Patient Position (if appropriate) Lying  Pulse Rate 71  Oxygen Therapy  SpO2 95 %  O2 Device Room Air  2nd dose Labetalol 40 mg IV administered. We will recheck B/P in 10 minutes.

## 2014-11-10 NOTE — MAU Note (Signed)
Pt. Urine in lab 

## 2014-11-11 ENCOUNTER — Encounter: Payer: Self-pay | Admitting: Obstetrics

## 2014-11-11 LAB — MRSA PCR SCREENING: MRSA BY PCR: NEGATIVE

## 2014-11-11 MED ORDER — PROMETHAZINE HCL 25 MG/ML IJ SOLN: 12.5000 mg | Freq: Once | INTRAMUSCULAR | Status: DC

## 2014-11-11 MED ORDER — IBUPROFEN 600 MG PO TABS: 600.0000 mg | ORAL_TABLET | Freq: Four times a day (QID) | ORAL | Status: DC | PRN

## 2014-11-11 MED ORDER — FUROSEMIDE 10 MG/ML IJ SOLN
40.0000 mg | Freq: Once | INTRAMUSCULAR | Status: AC
  Administered 2014-11-11: 40 mg via INTRAVENOUS
  Filled 2014-11-11 (×2): qty 4

## 2014-11-11 MED ORDER — HYDROMORPHONE HCL 2 MG PO TABS
4.0000 mg | ORAL_TABLET | ORAL | Status: DC | PRN
  Administered 2014-11-11 – 2014-11-15 (×13): 4 mg via ORAL
  Filled 2014-11-11 (×13): qty 2

## 2014-11-11 MED ORDER — LABETALOL HCL 200 MG PO TABS
400.0000 mg | ORAL_TABLET | Freq: Three times a day (TID) | ORAL | Status: DC
  Administered 2014-11-11 – 2014-11-15 (×11): 400 mg via ORAL
  Filled 2014-11-11 (×10): qty 2

## 2014-11-11 MED ORDER — DEXTROSE 5 % IV SOLN
500.0000 mg | Freq: Once | INTRAVENOUS | Status: AC
  Administered 2014-11-11: 500 mg via INTRAVENOUS
  Filled 2014-11-11: qty 5

## 2014-11-11 MED ORDER — LABETALOL HCL 200 MG PO TABS
200.0000 mg | ORAL_TABLET | Freq: Once | ORAL | Status: AC
  Administered 2014-11-11: 200 mg via ORAL
  Filled 2014-11-11: qty 1

## 2014-11-11 MED ORDER — ALPRAZOLAM 0.5 MG PO TABS
0.5000 mg | ORAL_TABLET | Freq: Two times a day (BID) | ORAL | Status: DC | PRN
  Administered 2014-11-11 – 2014-11-13 (×4): 0.5 mg via ORAL
  Filled 2014-11-11 (×4): qty 1

## 2014-11-11 MED ORDER — MORPHINE SULFATE 10 MG/ML IJ SOLN: 10.0000 mg | Freq: Once | INTRAMUSCULAR | Status: DC

## 2014-11-11 NOTE — Progress Notes (Addendum)
Pt continues to c/o headache, despite giving po pain medications. Discussed with Dr. Jodi Mourning earlier and will plan to give Depacon IV, as MD had phone consult with Neurologist.  Will need to start a 2nd IV since Magnesium gtt infusing. Pt is requesting to postpone until after she eats. Pt currently denies scotoma. MOE X 4 with equal strength. DTR's 2+/no clonus noted. Alert and oriented with clear speech. Steady gait witnessed.

## 2014-11-11 NOTE — Progress Notes (Signed)
   11/11/14 1104  Vitals  BP (!) 176/93 mmHg  MAP (mmHg) 115  Pulse Rate 73  Resp 20  Oxygen Therapy  SpO2 100 %  O2 Device Room Air  Dr. Jodi Mourning made aware

## 2014-11-11 NOTE — Progress Notes (Signed)
   11/11/14 0000  Vitals  BP (!) 175/94 mmHg  MAP (mmHg) 116  BP Location Left Arm  BP Method Automatic  Patient Position (if appropriate) Lying  Pulse Rate 73  Oxygen Therapy  SpO2 99 %  Labetalol 80 mg administered, we will recheck B/P in 10 minutes.

## 2014-11-11 NOTE — Progress Notes (Signed)
   11/11/14 2200  Vitals  BP (!) 174/88 mmHg  MAP (mmHg) 111  Labetalol  400 mg administered. We will continue to monitor.

## 2014-11-11 NOTE — Progress Notes (Signed)
Subjective: Patient reports headache  Objective: I have reviewed patient's vital signs, intake and output, medications and labs.  General: alert and no distress Resp: clear to auscultation bilaterally Cardio: regular rate and rhythm, S1, S2 normal, no murmur, click, rub or gallop GI: normal findings: soft, non-tender Extremities: edema LE pitting Vaginal Bleeding: none   Assessment/Plan: Postpartum preeclampsia.  Persistent HA.  Stable BP.  Continue magnesium sulfate.  LOS: 1 day    [name] A 11/11/2014, 10:54 AM

## 2014-11-11 NOTE — Progress Notes (Signed)
   11/11/14 0010  Vitals  BP (!) 162/90 mmHg  MAP (mmHg) 109  BP Location Right Arm  BP Method Automatic  Patient Position (if appropriate) Lying  Pulse Rate 73  Oxygen Therapy  SpO2 98 %  PRN Hydralazine 10 mg IV administered as ordered. We will continue to monitor.

## 2014-11-11 NOTE — Progress Notes (Signed)
Subjective:     [name] is a 34 y.o. female who presents for a postpartum visit. She is 1 week postpartum following a low cervical transverse Cesarean section. I have fully reviewed the prenatal and intrapartum course. The delivery was at 18 gestational weeks. Outcome: repeat cesarean section, low transverse incision. Anesthesia: spinal. Postpartum course has been normal. Baby's course has been normal. Baby is feeding by bottle - Similac Advance. Bleeding thin lochia. Bowel function is normal. Bladder function is normal. Patient is not sexually active. Contraception method is abstinence. Postpartum depression screening: negative.  Tobacco, alcohol and substance abuse history reviewed.  Adult immunizations reviewed including TDAP, rubella and varicella.  The following portions of the patient's history were reviewed and updated as appropriate: allergies, current medications, past family history, past medical history, past social history, past surgical history and problem list.  Review of Systems A comprehensive review of systems was negative.   Objective:    BP 178/92 mmHg  Pulse 73  Ht 5\' 4"  (1.626 m)  LMP 02/03/2014  General:  alert and no distress   Breasts:  inspection negative, no nipple discharge or bleeding, no masses or nodularity palpable  Lungs: clear to auscultation bilaterally  Heart:  regular rate and rhythm, S1, S2 normal, no murmur, click, rub or gallop     Assessment:    Postpartum HTN.    Plan:    1. Contraception: abstinence 2.  Sent to [facility] for further evaluation. 3. Follow up in: 2 weeks or as needed.   Healthy lifestyle practices reviewed

## 2014-11-11 NOTE — Progress Notes (Signed)
   11/11/14 0500  Vitals  BP (!) 173/94 mmHg  MAP (mmHg) 114  BP Location Left Arm  BP Method Automatic  Patient Position (if appropriate) Lying  Pulse Rate 76  Oxygen Therapy  SpO2 100 %  O2 Device Room Air  Scheduled Labetalol 200 mg administered. We will continue to monitor

## 2014-11-11 NOTE — Progress Notes (Signed)
   11/11/14 1500  Vitals  BP (!) 171/90 mmHg  MAP (mmHg) 111  Dr. Jodi Mourning notified-see new orders

## 2014-11-12 LAB — COMPREHENSIVE METABOLIC PANEL
ALT: 105 U/L — ABNORMAL HIGH (ref 14–54)
ANION GAP: 9 (ref 5–15)
AST: 80 U/L — ABNORMAL HIGH (ref 15–41)
Albumin: 2.8 g/dL — ABNORMAL LOW (ref 3.5–5.0)
Alkaline Phosphatase: 107 U/L (ref 38–126)
BUN: 9 mg/dL (ref 6–20)
CO2: 31 mmol/L (ref 22–32)
Calcium: 7.4 mg/dL — ABNORMAL LOW (ref 8.9–10.3)
Chloride: 100 mmol/L — ABNORMAL LOW (ref 101–111)
Creatinine, Ser: 0.54 mg/dL (ref 0.44–1.00)
Glucose, Bld: 104 mg/dL — ABNORMAL HIGH (ref 70–99)
Potassium: 3.6 mmol/L (ref 3.5–5.1)
Sodium: 140 mmol/L (ref 135–145)
TOTAL PROTEIN: 6 g/dL — AB (ref 6.5–8.1)
Total Bilirubin: 0.2 mg/dL — ABNORMAL LOW (ref 0.3–1.2)

## 2014-11-12 LAB — CBC
HCT: 27.2 % — ABNORMAL LOW (ref 36.0–46.0)
Hemoglobin: 8.8 g/dL — ABNORMAL LOW (ref 12.0–15.0)
MCH: 31.3 pg (ref 26.0–34.0)
MCHC: 32.4 g/dL (ref 30.0–36.0)
MCV: 96.8 fL (ref 78.0–100.0)
Platelets: 280 10*3/uL (ref 150–400)
RBC: 2.81 MIL/uL — ABNORMAL LOW (ref 3.87–5.11)
RDW: 16.3 % — AB (ref 11.5–15.5)
WBC: 10.5 10*3/uL (ref 4.0–10.5)

## 2014-11-12 MED ORDER — SENNOSIDES-DOCUSATE SODIUM 8.6-50 MG PO TABS
2.0000 | ORAL_TABLET | Freq: Every day | ORAL | Status: DC
  Administered 2014-11-12 – 2014-11-13 (×2): 2 via ORAL
  Filled 2014-11-12 (×3): qty 2

## 2014-11-12 MED ORDER — AMLODIPINE BESYLATE 10 MG PO TABS
10.0000 mg | ORAL_TABLET | Freq: Every day | ORAL | Status: DC
  Administered 2014-11-12 – 2014-11-15 (×4): 10 mg via ORAL
  Filled 2014-11-12 (×4): qty 1

## 2014-11-12 MED ORDER — FUROSEMIDE 10 MG/ML IJ SOLN
40.0000 mg | Freq: Once | INTRAMUSCULAR | Status: AC
  Administered 2014-11-12: 40 mg via INTRAVENOUS
  Filled 2014-11-12: qty 4

## 2014-11-12 MED ORDER — BACITRACIN-NEOMYCIN-POLYMYXIN OINTMENT TUBE
TOPICAL_OINTMENT | Freq: Every day | CUTANEOUS | Status: DC
  Administered 2014-11-12 – 2014-11-14 (×3): via TOPICAL
  Filled 2014-11-12: qty 15

## 2014-11-12 MED ORDER — DOCUSATE SODIUM 100 MG PO CAPS
100.0000 mg | ORAL_CAPSULE | Freq: Two times a day (BID) | ORAL | Status: DC
  Administered 2014-11-12 – 2014-11-15 (×5): 100 mg via ORAL
  Filled 2014-11-12 (×6): qty 1

## 2014-11-12 NOTE — Progress Notes (Signed)
Patient's abdominal site, sore and foul smelling. The margines are pale and moist. It has not di-hissed, rather appears as if her abdominal folds rubbed the site repeatedly causing the site to bruise. Dr Jodi Mourning was made aware and an order for Neosporin topical treatment, ABD pads and abdominal binder. MD stated he will assess the site during his visit tomorrow morning. Patient was informed of same.

## 2014-11-12 NOTE — Progress Notes (Signed)
Post Partum Day 9.  Repeat LTCS and BTL. Subjective: Mild headache, much better than yesterday  Objective: Blood pressure 172/98, pulse 84, temperature 98.1 F (36.7 C), temperature source Oral, resp. rate 20, weight 216 lb 8 oz (98.204 kg), last menstrual period 02/03/2014, SpO2 98 %, unknown if currently breastfeeding.  Physical Exam:  General: alert and no distress Lochia: appropriate Uterine Fundus: firm Incision: healing well DVT Evaluation: No evidence of DVT seen on physical exam.   Recent Labs  11/10/14 1520  HGB 7.7*  HCT 23.0*    Assessment/Plan: Postpartum preeclampsia.  Stable, but BP's still labile.  Brisk diuresis in response to Lasix.  Continue Magnesium Sulfate. Anemia.  Chronic iron deficiency.  Clinically stable.  Start iron.   LOS: 2 days   Phill Steck A 11/12/2014, 8:12 AM

## 2014-11-13 MED ORDER — DIBUCAINE 1 % RE OINT
TOPICAL_OINTMENT | RECTAL | Status: DC | PRN
  Administered 2014-11-13: 1 via RECTAL
  Administered 2014-11-14: 22:00:00 via RECTAL
  Filled 2014-11-13 (×2): qty 28

## 2014-11-13 NOTE — Progress Notes (Signed)
UR chart review completed.  

## 2014-11-13 NOTE — Progress Notes (Signed)
Subjective: Postpartum Day 10: Cesarean Delivery Patient reports no headache and less swelling of feet.  Objective: Vital signs in last 24 hours: Temp:  [97.8 F (36.6 C)-99.3 F (37.4 C)] 99.2 F (37.3 C) (05/02 1201) Pulse Rate:  [75-88] 77 (05/02 1415) Resp:  [18-20] 20 (05/02 1400) BP: (139-165)/(70-99) 164/90 mmHg (05/02 1400) SpO2:  [93 %-100 %] 100 % (05/02 1415) Weight:  [207 lb 5 oz (94.036 kg)-207 lb 8 oz (94.121 kg)] 207 lb 5 oz (94.036 kg) (05/02 0428)  Physical Exam:  General: alert Lochia: appropriate Uterine Fundus: firm Incision: healing well DVT Evaluation: No evidence of DVT seen on physical exam.   Recent Labs  11/12/14 0906  HGB 8.8*  HCT 27.2*    Assessment/Plan: Status post Cesarean section.   Postpartum preeclampsia.  Improved.  Continue bedrest / Labetalol and Norvasc. Repeat PIH labs tomorrow.( LFT's slightly elevated on last draw )  Azarria Balint A 11/13/2014, 4:06 PM

## 2014-11-14 LAB — COMPREHENSIVE METABOLIC PANEL
ALK PHOS: 91 U/L (ref 38–126)
ALT: 56 U/L — ABNORMAL HIGH (ref 14–54)
ANION GAP: 8 (ref 5–15)
AST: 26 U/L (ref 15–41)
Albumin: 2.8 g/dL — ABNORMAL LOW (ref 3.5–5.0)
BUN: 11 mg/dL (ref 6–20)
CHLORIDE: 105 mmol/L (ref 101–111)
CO2: 28 mmol/L (ref 22–32)
Calcium: 8.6 mg/dL — ABNORMAL LOW (ref 8.9–10.3)
Creatinine, Ser: 0.61 mg/dL (ref 0.44–1.00)
GFR calc non Af Amer: >60 mL/min (ref >60)
Glucose, Bld: 80 mg/dL (ref 70–99)
POTASSIUM: 4.3 mmol/L (ref 3.5–5.1)
Sodium: 141 mmol/L (ref 135–145)
Total Bilirubin: 0.4 mg/dL (ref 0.3–1.2)
Total Protein: 6 g/dL — ABNORMAL LOW (ref 6.5–8.1)

## 2014-11-14 LAB — CBC WITH DIFFERENTIAL/PLATELET
BASOS PCT: 1 % (ref 0–1)
Basophils Absolute: 0.1 10*3/uL (ref 0.0–0.1)
Eosinophils Absolute: 0.6 10*3/uL (ref 0.0–0.7)
Eosinophils Relative: 7 % — ABNORMAL HIGH (ref 0–5)
HEMATOCRIT: 28 % — AB (ref 36.0–46.0)
HEMOGLOBIN: 9.3 g/dL — AB (ref 12.0–15.0)
Lymphocytes Relative: 20 % (ref 12–46)
Lymphs Abs: 1.7 10*3/uL (ref 0.7–4.0)
MCH: 32 pg (ref 26.0–34.0)
MCHC: 33.2 g/dL (ref 30.0–36.0)
MCV: 96.2 fL (ref 78.0–100.0)
MONO ABS: 0.5 10*3/uL (ref 0.1–1.0)
MONOS PCT: 5 % (ref 3–12)
Neutro Abs: 5.8 10*3/uL (ref 1.7–7.7)
Neutrophils Relative %: 67 % (ref 43–77)
Platelets: 320 10*3/uL (ref 150–400)
RBC: 2.91 MIL/uL — AB (ref 3.87–5.11)
RDW: 15.9 % — ABNORMAL HIGH (ref 11.5–15.5)
WBC: 8.5 10*3/uL (ref 4.0–10.5)

## 2014-11-14 NOTE — Progress Notes (Signed)
Post Partum Day 11 Subjective: no complaints.  No headache.  Objective: Blood pressure 152/92, pulse 78, temperature 98.9 F (37.2 C), temperature source Oral, resp. rate 18, height 5' 4"  (1.626 m), weight 199 lb 1.3 oz (90.302 kg), last menstrual period 02/03/2014, SpO2 99 %, unknown if currently breastfeeding.  Physical Exam:  General: alert and no distress Lochia: appropriate Uterine Fundus: firm Incision: healing well DVT Evaluation: No evidence of DVT seen on physical exam. Extremities: Less edema ( 1+ )   Recent Labs  11/12/14 0906 11/14/14 0950  HGB 8.8* 9.3*  HCT 27.2* 28.0*   Results for [name], [name] (MRN [id number]) as of 11/14/2014 12:37  Ref. Range 11/14/2014 09:50  Sodium Latest Ref Range: 135-145 mmol/L 141  Potassium Latest Ref Range: 3.5-5.1 mmol/L 4.3  Chloride Latest Ref Range: 101-111 mmol/L 105  CO2 Latest Ref Range: 22-32 mmol/L 28  BUN Latest Ref Range: 6-20 mg/dL 11  Creatinine Latest Ref Range: 0.44-1.00 mg/dL 0.61  Calcium Latest Ref Range: 8.9-10.3 mg/dL 8.6 (L)  EGFR (Non-African Amer.) Latest Ref Range: >60 mL/min >60  EGFR (African American) Latest Ref Range: >60 mL/min >60  Glucose Latest Ref Range: 70-99 mg/dL 80  Anion gap Latest Ref Range: 5-15  8  Alkaline Phosphatase Latest Ref Range: 38-126 U/L 91  Albumin Latest Ref Range: 3.5-5.0 g/dL 2.8 (L)  AST Latest Ref Range: 15-41 U/L 26  ALT Latest Ref Range: 14-54 U/L 56 (H)  Total Protein Latest Ref Range: 6.5-8.1 g/dL 6.0 (L)  Total Bilirubin Latest Ref Range: 0.3-1.2 mg/dL 0.4  WBC Latest Ref Range: 4.0-10.5 K/uL 8.5  RBC Latest Ref Range: 3.87-5.11 MIL/uL 2.91 (L)  Hemoglobin Latest Ref Range: 12.0-15.0 g/dL 9.3 (L)  HCT Latest Ref Range: 36.0-46.0 % 28.0 (L)  MCV Latest Ref Range: 78.0-100.0 fL 96.2  MCH Latest Ref Range: 26.0-34.0 pg 32.0  MCHC Latest Ref Range: 30.0-36.0 g/dL 33.2  RDW Latest Ref Range: 11.5-15.5 % 15.9 (H)  Platelets Latest Ref Range: 150-400 K/uL 320  Neutrophils  Latest Ref Range: 43-77 % 67  Lymphocytes Latest Ref Range: 12-46 % 20  Monocytes Relative Latest Ref Range: 3-12 % 5  Eosinophil Latest Ref Range: 0-5 % 7 (H)  Basophil Latest Ref Range: 0-1 % 1  NEUT# Latest Ref Range: 1.7-7.7 K/uL 5.8  Lymphocyte # Latest Ref Range: 0.7-4.0 K/uL 1.7  Monocyte # Latest Ref Range: 0.1-1.0 K/uL 0.5  Eosinophils Absolute Latest Ref Range: 0.0-0.7 K/uL 0.6  Basophils Absolute Latest Ref Range: 0.0-0.1 K/uL 0.1    Assessment/Plan: Postpartum preeclampsia.  Brisk diuresis.  Much improved.  Seventeen pound weight loss since admission. ( 216 - 199 lbs. ) Plan for discharge tomorrow   LOS: 4 days   HARPER,CHARLES A 11/14/2014, 12:45 PM

## 2014-11-15 MED ORDER — ZOLPIDEM TARTRATE 10 MG PO TABS: 10.0000 mg | ORAL_TABLET | Freq: Every evening | ORAL | Status: DC | PRN

## 2014-11-15 MED ORDER — DOCUSATE SODIUM 100 MG PO CAPS: 100.0000 mg | ORAL_CAPSULE | Freq: Two times a day (BID) | ORAL | Status: DC | PRN

## 2014-11-15 MED ORDER — HYDROMORPHONE HCL 4 MG PO TABS: 4.0000 mg | ORAL_TABLET | ORAL | Status: DC | PRN

## 2014-11-15 MED ORDER — LABETALOL HCL 200 MG PO TABS: 400.0000 mg | ORAL_TABLET | Freq: Three times a day (TID) | ORAL | Status: DC

## 2014-11-15 MED ORDER — ALPRAZOLAM 0.5 MG PO TABS: 0.5000 mg | ORAL_TABLET | Freq: Two times a day (BID) | ORAL | Status: DC | PRN

## 2014-11-15 MED ORDER — DIBUCAINE 1 % RE OINT: 1.0000 "application " | TOPICAL_OINTMENT | Freq: Three times a day (TID) | RECTAL | Status: DC | PRN

## 2014-11-15 MED ORDER — AMLODIPINE BESYLATE 10 MG PO TABS: 10.0000 mg | ORAL_TABLET | Freq: Every day | ORAL | Status: DC

## 2014-11-15 NOTE — Discharge Summary (Signed)
Physician Discharge Summary  Patient ID: [name] MRN: [id number] DOB/AGE: [birth year] 34 y.o.  Admit date: 11/10/2014 Discharge date: 11/15/2014  Admission Diagnoses: Postpartum Preeclampsia  Discharge Diagnoses: Same Active Problems:   Postpartum hypertension   Discharged Condition: good  Hospital Course: Admitted with elevated BP, lower extremity edema and HA.  Responded well to therapy.  Discharged home in good condition.  Consults: None  Significant Diagnostic Studies: labs: CBC, CMET  Treatments: cardiac meds: labetolol, amlodipine and furosemide  Discharge Exam: Blood pressure 145/66, pulse 76, temperature 98.8 F (37.1 C), temperature source Oral, resp. rate 16, height 5\' 4"  (1.626 m), weight 194 lb 4 oz (88.111 kg), last menstrual period 02/03/2014, SpO2 100 %, unknown if currently breastfeeding. General appearance: alert and no distress Resp: clear to auscultation bilaterally Cardio: regular rate and rhythm, S1, S2 normal, no murmur, click, rub or gallop GI: normal findings: soft, non-tender Extremities: edema 1+ pedal edema  Disposition: 01-Home or Self Care  Discharge Instructions    Discharge activity:  No Restrictions    Complete by:  As directed      Discharge diet:  No restrictions    Complete by:  As directed      Do not have sex or do anything that might make you have an orgasm    Complete by:  As directed             Medication List    STOP taking these medications        butalbital-acetaminophen-caffeine 50-325-40 MG per tablet  Commonly known as:  FIORICET     diphenhydrAMINE 25 MG tablet  Commonly known as:  BENADRYL     oxyCODONE-acetaminophen 10-325 MG per tablet  Commonly known as:  PERCOCET      TAKE these medications        ALPRAZolam 0.5 MG tablet  Commonly known as:  XANAX  Take 1 tablet (0.5 mg total) by mouth 2 (two) times daily as needed for anxiety.     amLODipine 10 MG tablet  Commonly known as:  NORVASC  Take 1  tablet (10 mg total) by mouth daily.     dibucaine 1 % Oint  Commonly known as:  NUPERCAINAL  Place 1 application rectally 3 (three) times daily as needed for pain or hemorrhoids.     docusate sodium 100 MG capsule  Commonly known as:  COLACE  Take 1 capsule (100 mg total) by mouth 2 (two) times daily as needed for mild constipation or moderate constipation.     FUSION PLUS Caps  Take 1 capsule by mouth daily before breakfast.     HYDROmorphone 4 MG tablet  Commonly known as:  DILAUDID  Take 1 tablet (4 mg total) by mouth every 4 (four) hours as needed for moderate pain or severe pain.     ibuprofen 600 MG tablet  Commonly known as:  ADVIL,MOTRIN  Take 1 tablet (600 mg total) by mouth every 6 (six) hours as needed for mild pain.     labetalol 200 MG tablet  Commonly known as:  NORMODYNE  Take 2 tablets (400 mg total) by mouth every 8 (eight) hours.     loratadine 10 MG tablet  Commonly known as:  CLARITIN  Take 1 tablet (10 mg total) by mouth daily.     OB COMPLETE PETITE 35-5-1-200 MG Caps  Take 1 capsule by mouth daily.     omeprazole 20 MG capsule  Commonly known as:  PRILOSEC  Take 1 capsule (20  mg total) by mouth 2 (two) times daily before a meal.     ondansetron 8 MG tablet  Commonly known as:  ZOFRAN  Take 1 tablet (8 mg total) by mouth every 8 (eight) hours as needed for nausea or vomiting.     zolpidem 10 MG tablet  Commonly known as:  AMBIEN  Take 1 tablet (10 mg total) by mouth at bedtime as needed for sleep.           Follow-up Information    Follow up with Enma Maeda A, MD. Schedule an appointment as soon as possible for a visit on 11/20/2014.   Specialty:  Obstetrics and Gynecology   Why:  BP check   Contact information:   [address] [place] [address] [phone]       Signed: Shelly Bombard 11/15/2014, 10:02 AM

## 2014-11-15 NOTE — Progress Notes (Signed)

## 2014-11-15 NOTE — Progress Notes (Signed)
Post Partum Day 12 Subjective: no complaints  Objective: Blood pressure 145/66, pulse 76, temperature 98.8 F (37.1 C), temperature source Oral, resp. rate 16, height 5\' 4"  (1.626 m), weight 194 lb 4 oz (88.111 kg), last menstrual period 02/03/2014, SpO2 100 %, unknown if currently breastfeeding.  Physical Exam:  General: alert and no distress Lochia: appropriate Uterine Fundus: firm Incision: healing well DVT Evaluation: No evidence of DVT seen on physical exam.   Recent Labs  11/14/14 0950  HGB 9.3*  HCT 28.0*    Assessment/Plan: Postpartum preeclampsia.  Resolving. Anemia.  Clinically stable.  Iron Rx. Discharge home.  Labetalol / Norvasc Rx. F/U in 1 week.   LOS: 5 days   HARPER,CHARLES A 11/15/2014, 9:09 AM

## 2014-11-16 ENCOUNTER — Ambulatory Visit: Payer: Medicaid Other | Admitting: Obstetrics

## 2014-11-20 ENCOUNTER — Encounter: Payer: Self-pay | Admitting: Obstetrics

## 2014-11-20 ENCOUNTER — Ambulatory Visit (INDEPENDENT_AMBULATORY_CARE_PROVIDER_SITE_OTHER): Payer: Medicaid Other | Admitting: Obstetrics

## 2014-11-20 VITALS — BP 119/77 | HR 79 | Temp 98.9°F | Ht 63.0 in | Wt 192.0 lb

## 2014-11-20 DIAGNOSIS — O165 Unspecified maternal hypertension, complicating the puerperium: Secondary | ICD-10-CM

## 2014-11-20 DIAGNOSIS — O169 Unspecified maternal hypertension, unspecified trimester: Secondary | ICD-10-CM

## 2014-11-20 NOTE — Progress Notes (Signed)
Subjective:     [name] is a 34 y.o. female who presents for a postpartum visit. She is 2 weeks postpartum following a low cervical transverse Cesarean section. I have fully reviewed the prenatal and intrapartum course. The delivery was at 31 gestational weeks. Outcome: repeat cesarean section, low transverse incision and BTL. Anesthesia: spinal. Postpartum course has been complicated by elevated BP. Baby's course has been normal. Baby is feeding by bottle - Similac Advance. Bleeding thin lochia. Bowel function is normal. Bladder function is normal. Patient is not sexually active. Contraception method is tubal ligation. Postpartum depression screening: negative.  Tobacco, alcohol and substance abuse history reviewed.  Adult immunizations reviewed including TDAP, rubella and varicella.  The following portions of the patient's history were reviewed and updated as appropriate: allergies, current medications, past family history, past medical history, past social history, past surgical history and problem list.  Review of Systems A comprehensive review of systems was negative.   Objective:    BP 119/77 mmHg  Pulse 79  Temp(Src) 98.9 F (37.2 C)  Ht 5\' 3"  (1.6 m)  Wt 192 lb (87.091 kg)  BMI 34.02 kg/m2  LMP 02/03/2014  Breastfeeding? No   PE:      Abdomen:  Incision C, D, I.  Nontender.      Extremities:  1+ pedal edema  Assessment:    2 weeks postpartum.  PIH.  S/P repeat LTCS and BTL.  Doing well on Labetalol / Norvasc.  Plan:    1. Contraception: tubal ligation 2. Continue Labetalol / Norvasc 3. Follow up in: 2 weeks or as needed.   Healthy lifestyle practices reviewed

## 2014-11-22 ENCOUNTER — Ambulatory Visit: Payer: Medicaid Other | Admitting: Obstetrics

## 2014-12-05 ENCOUNTER — Ambulatory Visit: Payer: Medicaid Other | Admitting: Obstetrics

## 2014-12-13 ENCOUNTER — Ambulatory Visit (INDEPENDENT_AMBULATORY_CARE_PROVIDER_SITE_OTHER): Payer: Medicaid Other | Admitting: Obstetrics

## 2014-12-13 ENCOUNTER — Encounter: Payer: Self-pay | Admitting: Obstetrics

## 2014-12-13 NOTE — Progress Notes (Signed)
Subjective:     [name] is a 34 y.o. female who presents for a postpartum visit. She is 6 weeks postpartum following a low cervical transverse Cesarean section. I have fully reviewed the prenatal and intrapartum course. The delivery was at 71 gestational weeks. Outcome: repeat cesarean section, low transverse incision. Anesthesia: spinal. Postpartum course has been normal. Baby's course has been normal. Baby is feeding by bottle - Similac Advance. Bleeding thin lochia. Bowel function is normal. Bladder function is normal. Patient is not sexually active. Contraception method is abstinence. Postpartum depression screening: negative.  Tobacco, alcohol and substance abuse history reviewed.  Adult immunizations reviewed including TDAP, rubella and varicella.  The following portions of the patient's history were reviewed and updated as appropriate: allergies, current medications, past family history, past medical history, past social history, past surgical history and problem list.  Review of Systems A comprehensive review of systems was negative.   Objective:    BP 107/66 mmHg  Pulse 77  Temp(Src) 99.3 F (37.4 C)  Ht 5\' 4"  (1.626 m)  Wt 182 lb (82.555 kg)  BMI 31.22 kg/m2  Breastfeeding? No  General:  alert and no distress   Breasts:  inspection negative, no nipple discharge or bleeding, no masses or nodularity palpable  Lungs: clear to auscultation bilaterally  Heart:  regular rate and rhythm, S1, S2 normal, no murmur, click, rub or gallop  Abdomen: normal findings: soft, non-tender and incision is C, D, I.   Vulva:  normal  Vagina: normal vagina  Cervix:  no cervical motion tenderness  Corpus: normal size, contour, position, consistency, mobility, non-tender  Adnexa:  no mass, fullness, tenderness  Rectal Exam: Not performed.           Assessment:     Normal postpartum exam. Pap smear not done at today's visit.  Plan:    1. Contraception: tubal ligation 2. Continue PNV's 3.  Follow up in: 6 weeks or as needed.   Healthy lifestyle practices reviewed

## 2014-12-14 ENCOUNTER — Other Ambulatory Visit: Payer: Self-pay | Admitting: Obstetrics

## 2014-12-14 DIAGNOSIS — N939 Abnormal uterine and vaginal bleeding, unspecified: Secondary | ICD-10-CM

## 2014-12-14 DIAGNOSIS — K439 Ventral hernia without obstruction or gangrene: Secondary | ICD-10-CM

## 2014-12-14 MED ORDER — MEDROXYPROGESTERONE ACETATE 10 MG PO TABS: 10.0000 mg | ORAL_TABLET | Freq: Every day | ORAL | Status: DC

## 2014-12-15 ENCOUNTER — Telehealth: Payer: Self-pay

## 2014-12-15 NOTE — Telephone Encounter (Signed)
Patient has appt. at [facility] for Monday, 12/18/14 at 11:30am to arrive by 11am - gave her their phone number, address and told her to call and confirm

## 2015-01-25 ENCOUNTER — Ambulatory Visit: Payer: Medicaid Other | Admitting: Obstetrics

## 2015-06-06 ENCOUNTER — Emergency Department (HOSPITAL_COMMUNITY)
Admission: EM | Admit: 2015-06-06 | Discharge: 2015-06-06 | Disposition: A | Discharge status: Home / Self Care | Payer: Self-pay | Source: Home / Self Care

## 2015-06-06 ENCOUNTER — Encounter (HOSPITAL_COMMUNITY): Payer: Self-pay | Admitting: Emergency Medicine

## 2015-06-06 DIAGNOSIS — K439 Ventral hernia without obstruction or gangrene: Secondary | ICD-10-CM

## 2015-06-06 MED ORDER — HYDROCODONE-ACETAMINOPHEN 5-325 MG PO TABS: 1.0000 | ORAL_TABLET | Freq: Four times a day (QID) | ORAL | Status: DC | PRN

## 2015-06-06 MED ORDER — HYDROCODONE-ACETAMINOPHEN 5-325 MG PO TABS
1.0000 | ORAL_TABLET | Freq: Four times a day (QID) | ORAL | Status: DC | PRN
Start: 2015-06-06 — End: 2015-08-29

## 2015-06-06 NOTE — Discharge Instructions (Signed)
Laparoscopic Ventral Hernia Repair °Laparoscopic ventral hernia repair is a surgery to fix a ventral hernia. A ventral hernia, also called an incisional hernia, is a bulge of body tissue or intestines that pushes through the front part of the abdomen. This can happen if the connective tissue covering the muscles over the abdomen has a weak spot or is torn because of a surgical cut (incision) from a previous surgery. Laparoscopic ventral hernia repair is often done soon after diagnosis to stop the hernia from getting bigger, becoming uncomfortable, or becoming an emergency. This surgery usually takes about 2 hours, but the time can vary greatly. °LET YOUR HEALTH CARE PROVIDER KNOW ABOUT: °· Any allergies you have. °· All medicines you are taking, including steroids, vitamins, herbs, eye drops, creams, and over-the-counter medicines. °· Previous problems you or members of your family have had with the use of anesthetics. °· Any blood disorders you have. °· Previous surgeries you have had. °· Medical conditions you have. °RISKS AND COMPLICATIONS  °Generally, laparoscopic ventral hernia repair is a safe procedure. However, as with any surgical procedure, problems can occur. Possible problems include: °· Bleeding. °· Trouble passing urine or having a bowel movement after the surgery. °· Infection. °· Pneumonia. °· Blood clots. °· Pain in the area of the hernia. °· A bulge in the area of the hernia that may be caused by a collection of fluid. °· Injury to intestines or other structures in the abdomen. °· Return of the hernia after surgery. °In some cases, your health care provider may need to stop the laparoscopic procedure and do regular, open surgery. This may be necessary for very difficult hernias, when organs are hard to see, or when bleeding problems occur during surgery. °BEFORE THE PROCEDURE  °· You may need to have blood tests, urine tests, a chest X-ray, or an electrocardiogram done before the day of the  surgery. °· Ask your health care provider about changing or stopping your regular medicines. This is especially important if you are taking diabetes medicines or blood thinners. °· You may need to wash with a special type of germ-killing soap. °· Do not eat or drink anything after midnight the night before the procedure or as directed by your health care provider. °· Make plans to have someone drive you home after the procedure. °PROCEDURE  °· Small monitors will be put on your body. They are used to check your heart, blood pressure, and oxygen level. °· An IV access tube will be put into a vein in your hand or arm. Fluids and medicine will flow directly into your body through the IV tube. °· You will be given medicine that makes you go to sleep (general anesthetic). °· Your abdomen will be cleaned with a special soap to kill any germs on your skin. °· Once you are asleep, several small incisions will be made in your abdomen. °· The large space in your abdomen will be filled with air so that it expands. This gives your health care provider more room and a better view. °· A thin, lighted tube with a tiny camera on the end (laparoscope) is put through a small incision in your abdomen. The camera on the laparoscope sends a picture to a TV screen in the operating room. This gives your health care provider a good view inside your abdomen. °· Hollow tubes are put through the other small incisions in your abdomen. The tools needed for the procedure are put through these tubes. °· Your health care provider   puts the tissue or intestines that formed the hernia back in place. °· A screen-like patch (mesh) is used to close the hernia. This helps make the area stronger. Stitches, tacks, or staples are used to keep the mesh in place. °· Medicine and a bandage (dressing) or skin glue will be put over the incisions. °AFTER THE PROCEDURE  °· You will stay in a recovery area until the anesthetic wears off. Your blood pressure and  pulse will be checked often. °· You may be able to go home the same day or may need to stay in the hospital for 1-2 days after surgery. Your health care provider will decide when you can go home. °· You may feel some pain. You may be given medicine for pain. °· You will be urged to do breathing exercises that involve taking deep breaths. This helps prevent a lung infection after a surgery. °· You may have to wear compression stockings while you are in the hospital. These stockings help keep blood clots from forming in your legs. °  °This information is not intended to replace advice given to you by your health care provider. Make sure you discuss any questions you have with your health care provider. °  °Document Released: 06/16/2012 Document Revised: 07/05/2013 Document Reviewed: 06/16/2012 °Elsevier Interactive Patient Education ©2016 [employer] ° °

## 2015-06-06 NOTE — ED Notes (Signed)
The patient presented to the [facility] with a complaint of pain from an abdominal hernia.

## 2015-08-29 ENCOUNTER — Encounter (HOSPITAL_COMMUNITY): Payer: Self-pay | Admitting: *Deleted

## 2015-08-29 ENCOUNTER — Emergency Department (HOSPITAL_COMMUNITY)
Admission: EM | Admit: 2015-08-29 | Discharge: 2015-08-29 | Disposition: A | Discharge status: Home / Self Care | Payer: No Typology Code available for payment source | Attending: Emergency Medicine | Admitting: Emergency Medicine

## 2015-08-29 ENCOUNTER — Emergency Department (INDEPENDENT_AMBULATORY_CARE_PROVIDER_SITE_OTHER)
Admission: EM | Admit: 2015-08-29 | Discharge: 2015-08-29 | Disposition: A | Discharge status: Home / Self Care | Payer: Self-pay | Source: Home / Self Care | Attending: Family Medicine | Admitting: Family Medicine

## 2015-08-29 ENCOUNTER — Encounter (HOSPITAL_COMMUNITY): Payer: Self-pay | Admitting: Vascular Surgery

## 2015-08-29 DIAGNOSIS — F329 Major depressive disorder, single episode, unspecified: Secondary | ICD-10-CM | POA: Insufficient documentation

## 2015-08-29 DIAGNOSIS — R197 Diarrhea, unspecified: Secondary | ICD-10-CM | POA: Insufficient documentation

## 2015-08-29 DIAGNOSIS — F419 Anxiety disorder, unspecified: Secondary | ICD-10-CM | POA: Insufficient documentation

## 2015-08-29 DIAGNOSIS — Z79899 Other long term (current) drug therapy: Secondary | ICD-10-CM | POA: Insufficient documentation

## 2015-08-29 DIAGNOSIS — K439 Ventral hernia without obstruction or gangrene: Secondary | ICD-10-CM

## 2015-08-29 DIAGNOSIS — K429 Umbilical hernia without obstruction or gangrene: Secondary | ICD-10-CM | POA: Insufficient documentation

## 2015-08-29 DIAGNOSIS — F17210 Nicotine dependence, cigarettes, uncomplicated: Secondary | ICD-10-CM | POA: Insufficient documentation

## 2015-08-29 MED ORDER — IBUPROFEN 800 MG PO TABS: 800.0000 mg | ORAL_TABLET | Freq: Three times a day (TID) | ORAL | Status: DC

## 2015-08-29 MED ORDER — ONDANSETRON HCL 4 MG PO TABS: 4.0000 mg | ORAL_TABLET | Freq: Four times a day (QID) | ORAL | Status: DC

## 2015-08-29 MED ORDER — AMOXICILLIN 500 MG PO CAPS: 500.0000 mg | ORAL_CAPSULE | Freq: Three times a day (TID) | ORAL | Status: DC

## 2015-08-29 MED ORDER — HYDROCODONE-ACETAMINOPHEN 5-325 MG PO TABS: 1.0000 | ORAL_TABLET | Freq: Four times a day (QID) | ORAL | Status: DC | PRN

## 2015-08-29 NOTE — ED Notes (Signed)
Called to re check vitals signs with no answer

## 2015-08-29 NOTE — ED Provider Notes (Signed)
CSN: [id number]     Arrival date & time 08/29/15  1903 History   First MD Initiated Contact with Patient 08/29/15 2015     Chief Complaint  Patient presents with  . Abdominal Pain   (Consider location/radiation/quality/duration/timing/severity/associated sxs/prior Treatment) Patient is a 35 y.o. female presenting with abdominal pain. The history is provided by the patient.  Abdominal Pain Pain location:  Periumbilical Pain quality: burning and sharp   Progression:  Worsening Chronicity:  Chronic Context comment:  Seen earlier today in ER with nonsurg abd ,reducible ventral hernia, now requesting pain med until can be seen surgically. Associated symptoms: diarrhea and vomiting   Associated symptoms: no constipation     Past Medical History  Diagnosis Date  . Depression     never been treated , "probably got some "  . Anxiety   . [id number]    Past Surgical History  Procedure Laterality Date  . Dilation and curettage of uterus    . Therapeutic abortion    . Cesarean section    . Cholecystectomy, laparoscopic    . Cesarean section with bilateral tubal ligation Bilateral 11/03/2014    Procedure: CESAREAN SECTION WITH BILATERAL TUBAL LIGATION;  Surgeon: Shelly Bombard, MD;  Location: [facility];  Service: Obstetrics;  Laterality: Bilateral;   Family History  Problem Relation Age of Onset  . Diabetes Father   . Hypertension Father   . Cancer Maternal Aunt     breast   Social History  Substance Use Topics  . Smoking status: Current Some Day Smoker    Types: Cigarettes  . Smokeless tobacco: None     Comment: prior to preg  . Alcohol Use: No   OB History    Gravida Para Term Preterm AB TAB SAB Ectopic Multiple Living   8 3 3  0 4 3 1   0 3     Review of Systems  Constitutional: Negative.   Gastrointestinal: Positive for vomiting, abdominal pain and diarrhea. Negative for constipation and abdominal distention.  All other systems reviewed and are  negative.   Allergies  Levofloxacin and Sertraline  Home Medications   Prior to Admission medications   Medication Sig Start Date End Date Taking? Authorizing Provider  ALPRAZolam Duanne Moron) 0.5 MG tablet Take 1 tablet (0.5 mg total) by mouth 2 (two) times daily as needed for anxiety. 11/15/14   Shelly Bombard, MD  amLODipine (NORVASC) 10 MG tablet Take 1 tablet (10 mg total) by mouth daily. Patient not taking: Reported on 12/13/2014 11/15/14   Shelly Bombard, MD  amoxicillin (AMOXIL) 500 MG capsule Take 1 capsule (500 mg total) by mouth 3 (three) times daily. 08/29/15   Domenic Moras, PA-C  dibucaine (NUPERCAINAL) 1 % OINT Place 1 application rectally 3 (three) times daily as needed for pain or hemorrhoids. Patient not taking: Reported on 12/13/2014 11/15/14   Shelly Bombard, MD  docusate sodium (COLACE) 100 MG capsule Take 1 capsule (100 mg total) by mouth 2 (two) times daily as needed for mild constipation or moderate constipation. Patient not taking: Reported on 11/20/2014 11/15/14   Shelly Bombard, MD  HYDROcodone-acetaminophen (NORCO/VICODIN) 5-325 MG tablet Take 1 tablet by mouth every 6 (six) hours as needed. 08/29/15   Billy Fischer, MD  ibuprofen (ADVIL,MOTRIN) 800 MG tablet Take 1 tablet (800 mg total) by mouth 3 (three) times daily. 08/29/15   Domenic Moras, PA-C  Iron-FA-B Cmp-C-Biot-Probiotic (FUSION PLUS) CAPS Take 1 capsule by mouth daily before breakfast. 11/06/14   Juanda Crumble  Simona Huh, MD  labetalol (NORMODYNE) 200 MG tablet Take 2 tablets (400 mg total) by mouth every 8 (eight) hours. Patient not taking: Reported on 12/13/2014 11/15/14   Shelly Bombard, MD  loratadine (CLARITIN) 10 MG tablet Take 1 tablet (10 mg total) by mouth daily. 10/17/14   Shelly Bombard, MD  medroxyPROGESTERone (PROVERA) 10 MG tablet Take 1 tablet (10 mg total) by mouth daily. 12/14/14   Shelly Bombard, MD  omeprazole (PRILOSEC) 20 MG capsule Take 1 capsule (20 mg total) by mouth 2 (two) times daily before a meal. 05/16/14    Shelly Bombard, MD  ondansetron (ZOFRAN) 4 MG tablet Take 1 tablet (4 mg total) by mouth every 6 (six) hours. Prn n/v. 08/29/15   Billy Fischer, MD  Prenat-FeCbn-FeAspGl-FA-Omega (OB COMPLETE PETITE) 35-5-1-200 MG CAPS Take 1 capsule by mouth daily. 05/24/14   Shelly Bombard, MD   Meds Ordered and Administered this Visit  Medications - No data to display  BP 129/79 mmHg  Pulse 78  Temp(Src) 98.2 F (36.8 C) (Oral)  Resp 16  SpO2 99% No data found.   Physical Exam  Constitutional: She is oriented to person, place, and time. She appears well-developed and well-nourished. She appears distressed.  Abdominal: Soft. Bowel sounds are normal. She exhibits no distension and no mass. There is tenderness. There is no rebound and no guarding.  Reducible ventral hernia, no acute changes.bs active.  Neurological: She is alert and oriented to person, place, and time.  Skin: Skin is warm and dry.  Nursing note and vitals reviewed.   ED Course  Procedures (including critical care time)  Labs Review Labs Reviewed - No data to display  Imaging Review No results found.   Visual Acuity Review  Right Eye Distance:   Left Eye Distance:   Bilateral Distance:    Right Eye Near:   Left Eye Near:    Bilateral Near:         MDM   1. Hernia of anterior abdominal wall        Billy Fischer, MD 08/29/15 2028

## 2015-08-29 NOTE — ED Notes (Signed)
Pt reports to the ED for eval of umbilical hernia. She reports she has had it for many years but she does not have insurance so she cannot get it removed. Reports pain is worse with lifting, laughing, and movement. She is able to still pass urine and stool. Reports associated nausea. No discoloration noted to hernia. Pt A&Ox4, resp e/u, and skin warm and dry.

## 2015-08-29 NOTE — ED Provider Notes (Signed)
CSN: [id number]     Arrival date & time 08/29/15  1232 History  By signing my name below, I, Soijett Blue, attest that this documentation has been prepared under the direction and in the presence of Domenic Moras, PA-C Electronically Signed: Soijett Blue, ED Scribe. 08/29/2015. 5:27 PM.   Chief Complaint  Patient presents with  . Hernia      The history is provided by the patient. No language interpreter was used.    HPI Comments: [name] is a 35 y.o. female who presents to the Emergency Department complaining of constant throbbing, umbilicus hernia since childhood worsening recently. She reports that her hernia is reducible. She states that she is aware that she has anhernia and that it will need to be removed, but she is unable too do so for financial reasons. Pt notes that her hernia pain is worsened with lifting, laughing, movement, and palpation. Pt denies any alleviating factors. Pt last bowel movement was this morning. Pt states that she takes hydrocodone 5-325 mg for her hernia pain that is Rx to her by Urgent Care who informed her that she will need surgery. She states that she is having associated symptoms of nausea, loose stools that have since resolved, and vomiting that has since resolved.  She states that she has not tried any medications for the relief for her symptoms. She denies difficulty urinating, constipation, fever, chills, and any other symptoms. Pt denies having a PCP at this time. Pt spoke with a representative today who informed her that they will aid in working out financial plans for her hernia surgery. Pt has had her gallbladder removed.     Past Medical History  Diagnosis Date  . Depression     never been treated , "probably got some "  . Anxiety   . [id number])    Past Surgical History  Procedure Laterality Date  . Dilation and curettage of uterus    . Therapeutic abortion    . Cesarean section    . Cholecystectomy, laparoscopic    . Cesarean  section with bilateral tubal ligation Bilateral 11/03/2014    Procedure: CESAREAN SECTION WITH BILATERAL TUBAL LIGATION;  Surgeon: Shelly Bombard, MD;  Location: [facility];  Service: Obstetrics;  Laterality: Bilateral;   Family History  Problem Relation Age of Onset  . Diabetes Father   . Hypertension Father   . Cancer Maternal Aunt     breast   Social History  Substance Use Topics  . Smoking status: Current Some Day Smoker    Types: Cigarettes  . Smokeless tobacco: None     Comment: prior to preg  . Alcohol Use: No   OB History    Gravida Para Term Preterm AB TAB SAB Ectopic Multiple Living   8 3 3  0 4 3 1   0 3     Review of Systems  Constitutional: Negative for fever and chills.  Gastrointestinal: Positive for nausea, vomiting, abdominal pain and diarrhea. Negative for constipation.  Genitourinary: Negative for difficulty urinating.      Allergies  Levofloxacin and Sertraline  Home Medications   Prior to Admission medications   Medication Sig Start Date End Date Taking? Authorizing Provider  ALPRAZolam Duanne Moron) 0.5 MG tablet Take 1 tablet (0.5 mg total) by mouth 2 (two) times daily as needed for anxiety. 11/15/14   Shelly Bombard, MD  amLODipine (NORVASC) 10 MG tablet Take 1 tablet (10 mg total) by mouth daily. Patient not taking: Reported on 12/13/2014 11/15/14  Shelly Bombard, MD  dibucaine (NUPERCAINAL) 1 % OINT Place 1 application rectally 3 (three) times daily as needed for pain or hemorrhoids. Patient not taking: Reported on 12/13/2014 11/15/14   Shelly Bombard, MD  docusate sodium (COLACE) 100 MG capsule Take 1 capsule (100 mg total) by mouth 2 (two) times daily as needed for mild constipation or moderate constipation. Patient not taking: Reported on 11/20/2014 11/15/14   Shelly Bombard, MD  HYDROcodone-acetaminophen (NORCO/VICODIN) 5-325 MG tablet Take 1 tablet by mouth every 6 (six) hours as needed. 06/06/15   Konrad Felix, PA  ibuprofen (ADVIL,MOTRIN) 600 MG  tablet Take 1 tablet (600 mg total) by mouth every 6 (six) hours as needed for mild pain. 11/06/14   Shelly Bombard, MD  Iron-FA-B Cmp-C-Biot-Probiotic (FUSION PLUS) CAPS Take 1 capsule by mouth daily before breakfast. 11/06/14   Shelly Bombard, MD  labetalol (NORMODYNE) 200 MG tablet Take 2 tablets (400 mg total) by mouth every 8 (eight) hours. Patient not taking: Reported on 12/13/2014 11/15/14   Shelly Bombard, MD  loratadine (CLARITIN) 10 MG tablet Take 1 tablet (10 mg total) by mouth daily. 10/17/14   Shelly Bombard, MD  medroxyPROGESTERone (PROVERA) 10 MG tablet Take 1 tablet (10 mg total) by mouth daily. 12/14/14   Shelly Bombard, MD  omeprazole (PRILOSEC) 20 MG capsule Take 1 capsule (20 mg total) by mouth 2 (two) times daily before a meal. 05/16/14   Shelly Bombard, MD  ondansetron (ZOFRAN) 8 MG tablet Take 1 tablet (8 mg total) by mouth every 8 (eight) hours as needed for nausea or vomiting. Patient not taking: Reported on 11/10/2014 08/03/14   Shelly Bombard, MD  Prenat-FeCbn-FeAspGl-FA-Omega (OB COMPLETE PETITE) 35-5-1-200 MG CAPS Take 1 capsule by mouth daily. 05/24/14   Shelly Bombard, MD  zolpidem (AMBIEN) 10 MG tablet Take 1 tablet (10 mg total) by mouth at bedtime as needed for sleep. Patient not taking: Reported on 12/13/2014 11/15/14   Shelly Bombard, MD   BP 124/60 mmHg  Pulse 91  Temp(Src) 98.5 F (36.9 C) (Oral)  Resp 18  SpO2 97% Physical Exam  Constitutional: She is oriented to person, place, and time. She appears well-developed and well-nourished. No distress.  HENT:  Head: Normocephalic and atraumatic.  Eyes: EOM are normal.  Neck: Neck supple.  Cardiovascular: Normal rate, regular rhythm and normal heart sounds.  Exam reveals no gallop and no friction rub.   No murmur heard. Pulmonary/Chest: Effort normal and breath sounds normal. No respiratory distress. She has no wheezes. She has no rales.  Abdominal: Soft. Bowel sounds are normal. There is tenderness. A  hernia is present.  Reducible ventral hernia above the umbilicus that is mildly TTP. No overlying skin changes. Abdomen is otherwise soft.  Musculoskeletal: Normal range of motion.  Neurological: She is alert and oriented to person, place, and time.  Skin: Skin is warm and dry.  Psychiatric: She has a normal mood and affect. Her behavior is normal.  Nursing note and vitals reviewed.   ED Course  Procedures (including critical care time) DIAGNOSTIC STUDIES: Oxygen Saturation is 97% on RA, nl by my interpretation.    COORDINATION OF CARE: 5:25 PM Discussed treatment plan with pt at bedside which includes referral to [place] [facility] and pt agreed to plan.    MDM   Final diagnoses:  Umbilical hernia without obstruction and without gangrene   I suspect that this is a recurrent ventral hernia that  shows no signs of strangulation or incarceration. It is easily reducible. Will refer the pt to general surgery at [facility] for further evaluation. Discussed return precautions. Pt is hemodynamically stable & in NAD prior to discharge.  5:47 PM- Pt is requesting narcotic medications at this time for her symptoms. I do not think it is appropriate for this condiiton. I offered the pt tylenol/ibuprofen for her pain, to which the pt refused. Pt requesting to speak with another provider. I spoke with Dr. Wyvonnia Dusky who will see the pt.  Pt also complains of sinus congestion x 2 weeks with a hx of chronic sinusitis. I have agreed to provide her with an abx although my suspicion is low.  ENT unremarkable with maxillary tenderness to percussion.   6:45 PM Dr. Wyvonnia Dusky has also seen and evaluated pt.  We offer abd/pelvis CT scan for further evaluation although our suspicion for incarceration or strangulation is low.  Pt declined.  Referral given.     BP 124/60 mmHg  Pulse 91  Temp(Src) 98.5 F (36.9 C) (Oral)  Resp 18  SpO2 97%   I personally performed the services described  in this documentation, which was scribed in my presence. The recorded information has been reviewed and is accurate.      Domenic Moras, PA-C 08/29/15 [place], PA-C 08/29/15 1846  Ezequiel Essex, MD 08/30/15 [phone]

## 2015-08-29 NOTE — ED Notes (Signed)
PT reports she takes hydrocodone 5/325. For pain.

## 2015-08-29 NOTE — ED Notes (Signed)
Pt  States   She  Has  A  Hernia   She  Was  Just  Seen  In the  Emergency  Dept         And  evaulated  She  Presents  Here  Requesting  Pain  meds     She  States   She  Needs  hyrdrocodone

## 2015-08-29 NOTE — Discharge Instructions (Signed)
No more pain medicine will be dispensed by Urgent Care for this problem, see surgeon for repair of hernia.

## 2015-08-29 NOTE — Discharge Instructions (Signed)
Hernia, Adult A hernia is the bulging of an organ or tissue through a weak spot in the muscles of the abdomen (abdominal wall). Hernias develop most often near the navel or groin. There are many kinds of hernias. Common kinds include:  Femoral hernia. This kind of hernia develops under the groin in the upper thigh area.  Inguinal hernia. This kind of hernia develops in the groin or scrotum.  Umbilical hernia. This kind of hernia develops near the navel.  Hiatal hernia. This kind of hernia causes part of the stomach to be pushed up into the chest.  Incisional hernia. This kind of hernia bulges through a scar from an abdominal surgery. CAUSES This condition may be caused by:  Heavy lifting.  Coughing over a long period of time.  Straining to have a bowel movement.  An incision made during an abdominal surgery.  A birth defect (congenital defect).  Excess weight or obesity.  Smoking.  Poor nutrition.  Cystic fibrosis.  Excess fluid in the abdomen.  Undescended testicles. SYMPTOMS Symptoms of a hernia include:  A lump on the abdomen. This is the first sign of a hernia. The lump may become more obvious with standing, straining, or coughing. It may get bigger over time if it is not treated or if the condition causing it is not treated.  Pain. A hernia is usually painless, but it may become painful over time if treatment is delayed. The pain is usually dull and may get worse with standing or lifting heavy objects. Sometimes a hernia gets tightly squeezed in the weak spot (strangulated) or stuck there (incarcerated) and causes additional symptoms. These symptoms may include:  Vomiting.  Nausea.  Constipation.  Irritability. DIAGNOSIS A hernia may be diagnosed with:  A physical exam. During the exam your health care provider may ask you to cough or to make a specific movement, because a hernia is usually more visible when you move.  Imaging tests. These can  include:  X-rays.  Ultrasound.  CT scan. TREATMENT A hernia that is small and painless may not need to be treated. A hernia that is large or painful may be treated with surgery. Inguinal hernias may be treated with surgery to prevent incarceration or strangulation. Strangulated hernias are always treated with surgery, because lack of blood to the trapped organ or tissue can cause it to die. Surgery to treat a hernia involves pushing the bulge back into place and repairing the weak part of the abdomen. HOME CARE INSTRUCTIONS  Avoid straining.  Do not lift anything heavier than 10 lb (4.5 kg).  Lift with your leg muscles, not your back muscles. This helps avoid strain.  When coughing, try to cough gently.  Prevent constipation. Constipation leads to straining with bowel movements, which can make a hernia worse or cause a hernia repair to break down. You can prevent constipation by:  Eating a high-fiber diet that includes plenty of fruits and vegetables.  Drinking enough fluids to keep your urine clear or pale yellow. Aim to drink 6-8 glasses of water per day.  Using a stool softener as directed by your health care provider.  Lose weight, if you are overweight.  Do not use any tobacco products, including cigarettes, chewing tobacco, or electronic cigarettes. If you need help quitting, ask your health care provider.  Keep all follow-up visits as directed by your health care provider. This is important. Your health care provider may need to monitor your condition. SEEK MEDICAL CARE IF:  You have   swelling, redness, and pain in the affected area.  Your bowel habits change. SEEK IMMEDIATE MEDICAL CARE IF:  You have a fever.  You have abdominal pain that is getting worse.  You feel nauseous or you vomit.  You cannot push the hernia back in place by gently pressing on it while you are lying down.  The hernia:  Changes in shape or size.  Is stuck outside the  abdomen.  Becomes discolored.  Feels hard or tender.   This information is not intended to replace advice given to you by your health care provider. Make sure you discuss any questions you have with your health care provider.   Document Released: 06/30/2005 Document Revised: 07/21/2014 Document Reviewed: 05/10/2014 Elsevier Interactive Patient Education 2016 [employer]  

## 2015-09-24 ENCOUNTER — Telehealth: Payer: Self-pay | Admitting: *Deleted

## 2015-09-24 NOTE — Telephone Encounter (Signed)
Patient states since she had her tubes tied she has had heavy cycles with clotting, nausea, and she wants them untied. 5:45 Call to patient- told her that will not help her symptoms. She can hormonally manipulate her symptoms- but untying tubes is not going to help. She has done some investigation and is regretful about her decision. Discussed options: pills, ring,IUD,ablation...   She wants to use the ring for now. She does not have insurance- but has filled out her paperwork for it. Samples of 2 rings to the front for her to pick up and she will call for an appointment.

## 2015-11-14 ENCOUNTER — Encounter: Payer: Self-pay | Admitting: *Deleted

## 2016-01-18 ENCOUNTER — Encounter (HOSPITAL_COMMUNITY): Payer: Self-pay | Admitting: Emergency Medicine

## 2016-01-18 ENCOUNTER — Ambulatory Visit (HOSPITAL_COMMUNITY)
Admission: EM | Admit: 2016-01-18 | Discharge: 2016-01-18 | Disposition: A | Discharge status: Home / Self Care | Payer: Medicaid Other | Attending: Family Medicine | Admitting: Family Medicine

## 2016-01-18 ENCOUNTER — Other Ambulatory Visit: Payer: Self-pay | Admitting: General Surgery

## 2016-01-18 DIAGNOSIS — T148XXA Other injury of unspecified body region, initial encounter: Secondary | ICD-10-CM

## 2016-01-18 DIAGNOSIS — K439 Ventral hernia without obstruction or gangrene: Secondary | ICD-10-CM

## 2016-01-18 DIAGNOSIS — T148 Other injury of unspecified body region: Secondary | ICD-10-CM | POA: Diagnosis not present

## 2016-01-18 LAB — POCT URINALYSIS DIP (DEVICE)
Bilirubin Urine: NEGATIVE
Glucose, UA: NEGATIVE mg/dL
Ketones, ur: NEGATIVE mg/dL
LEUKOCYTES UA: NEGATIVE
Nitrite: NEGATIVE
Protein, ur: 30 mg/dL — AB
UROBILINOGEN UA: 1 mg/dL (ref 0.0–1.0)
pH: 6.5 (ref 5.0–8.0)

## 2016-01-18 LAB — POCT PREGNANCY, URINE: Preg Test, Ur: NEGATIVE

## 2016-01-18 MED ORDER — KETOROLAC TROMETHAMINE 60 MG/2ML IM SOLN
INTRAMUSCULAR | Status: AC
  Filled 2016-01-18: qty 2

## 2016-01-18 MED ORDER — MELOXICAM 15 MG PO TABS: 7.5000 mg | ORAL_TABLET | Freq: Every day | ORAL | Status: DC

## 2016-01-18 MED ORDER — KETOROLAC TROMETHAMINE 60 MG/2ML IM SOLN
60.0000 mg | Freq: Once | INTRAMUSCULAR | Status: AC
  Administered 2016-01-18: 60 mg via INTRAMUSCULAR

## 2016-01-18 MED ORDER — METHOCARBAMOL 500 MG PO TABS: 500.0000 mg | ORAL_TABLET | Freq: Four times a day (QID) | ORAL | Status: DC | PRN

## 2016-01-18 NOTE — ED Notes (Signed)
C/o UTI sx onset onset 2 weeks Sx today include: back pain, dysuria, urinary urgency/freq, fevers, chills A&O x4... NAD

## 2016-01-18 NOTE — ED Provider Notes (Signed)
CSN: [id number]     Arrival date & time 01/18/16  1442 History   None    Chief Complaint  Patient presents with  . Urinary Tract Infection   (Consider location/radiation/quality/duration/timing/severity/associated sxs/prior Treatment) HPI   Right flank pain. Started 2 weeks ago after drinking a soda. Patient states she very rarely drinks soda. States that pain is fairly constant with waxing and waning nature. Worsens with certain body movements. Denies any hematuria, dysuria, frequency, lumbar back pain, fevers, nausea, vomiting. Daily bowel movements. Patient has had not had this checked up until now she did not have insurance July 1. Only on the right side.    Past Medical History  Diagnosis Date  . Depression     never been treated , "probably got some "  . Anxiety   . [id number])    Past Surgical History  Procedure Laterality Date  . Dilation and curettage of uterus    . Therapeutic abortion    . Cesarean section    . Cholecystectomy, laparoscopic    . Cesarean section with bilateral tubal ligation Bilateral 11/03/2014    Procedure: CESAREAN SECTION WITH BILATERAL TUBAL LIGATION;  Surgeon: Shelly Bombard, MD;  Location: [facility];  Service: Obstetrics;  Laterality: Bilateral;   Family History  Problem Relation Age of Onset  . Diabetes Father   . Hypertension Father   . Cancer Maternal Aunt     breast   Social History  Substance Use Topics  . Smoking status: Current Some Day Smoker    Types: Cigarettes  . Smokeless tobacco: None     Comment: prior to preg  . Alcohol Use: No   OB History    Gravida Para Term Preterm AB TAB SAB Ectopic Multiple Living   8 3 3  0 4 3 1   0 3     Review of Systems Per HPI with all other pertinent systems negative.   Allergies  Levofloxacin and Sertraline  Home Medications   Prior to Admission medications   Medication Sig Start Date End Date Taking? Authorizing Provider  HYDROcodone-acetaminophen (NORCO/VICODIN) 5-325 MG  tablet Take 1 tablet by mouth every 6 (six) hours as needed. 08/29/15   Billy Fischer, MD  meloxicam (MOBIC) 15 MG tablet Take 0.5-1 tablets (7.5-15 mg total) by mouth daily. 01/18/16   Waldemar Dickens, MD  methocarbamol (ROBAXIN) 500 MG tablet Take 1-2 tablets (500-1,000 mg total) by mouth every 6 (six) hours as needed for muscle spasms. 01/18/16   Waldemar Dickens, MD   Meds Ordered and Administered this Visit   Medications  ketorolac (TORADOL) injection 60 mg (not administered)    BP 129/71 mmHg  Pulse 99  Temp(Src) 98.5 F (36.9 C) (Oral)  Resp 18  SpO2 99%  Breastfeeding? No No data found.   Physical Exam Physical Exam  Constitutional: oriented to person, place, and time. appears well-developed and well-nourished. No distress.  HENT:  Head: Normocephalic and atraumatic.  Eyes: EOMI. PERRL.  Neck: Normal range of motion.  Cardiovascular: RRR, no m/r/g, 2+ distal pulses,  Pulmonary/Chest: Effort normal and breath sounds normal. No respiratory distress.  Abdominal: Soft. Bowel sounds are normal. NonTTP, no distension.  Musculoskeletal: Normal range of motion. Slight tenderness to deep palpation of the right flank area. No focal/point tender area. No CVA tenderness.  Neurological: alert and oriented to person, place, and time.  Skin: Skin is warm. No rash noted. non diaphoretic.  Psychiatric: normal mood and affect. behavior is normal. Judgment and thought content  normal.   ED Course  Procedures (including critical care time)  Labs Review Labs Reviewed  POCT URINALYSIS DIP (DEVICE) - Abnormal; Notable for the following:    Hgb urine dipstick MODERATE (*)    Protein, ur 30 (*)    All other components within normal limits  POCT PREGNANCY, URINE    Imaging Review No results found.   Visual Acuity Review  Right Eye Distance:   Left Eye Distance:   Bilateral Distance:    Right Eye Near:   Left Eye Near:    Bilateral Near:         MDM   1. Muscle strain     Toradol 60 mg IM given in clinic. Prescription for meloxicam and Robaxin given to patient for additional relief. Unlikely related to nephrolithiasis but cannot entirely exclude. Patient's urine sample contaminated as is on her menstrual cycle.    Waldemar Dickens, MD 01/18/16 [phone]

## 2016-01-18 NOTE — Discharge Instructions (Signed)
You likely have a muscle strain Please start the robaxin today to help ease the muscle tension Please start the meloxicam in 24 hours for additional pain relief.  Please come back of your pain continues after you have complieted your period.

## 2016-01-24 ENCOUNTER — Ambulatory Visit
Admission: RE | Admit: 2016-01-24 | Discharge: 2016-01-24 | Disposition: A | Discharge status: Home / Self Care | Payer: Medicaid Other | Source: Ambulatory Visit | Attending: General Surgery | Admitting: General Surgery

## 2016-01-24 DIAGNOSIS — K439 Ventral hernia without obstruction or gangrene: Secondary | ICD-10-CM

## 2016-01-24 MED ORDER — IOPAMIDOL (ISOVUE-300) INJECTION 61%
100.0000 mL | Freq: Once | INTRAVENOUS | Status: AC | PRN
  Administered 2016-01-24: 100 mL via INTRAVENOUS

## 2016-02-01 ENCOUNTER — Other Ambulatory Visit: Payer: Self-pay | Admitting: General Surgery

## 2016-02-01 DIAGNOSIS — R160 Hepatomegaly, not elsewhere classified: Secondary | ICD-10-CM

## 2016-02-11 ENCOUNTER — Ambulatory Visit
Admission: RE | Admit: 2016-02-11 | Discharge: 2016-02-11 | Disposition: A | Discharge status: Home / Self Care | Payer: Medicaid Other | Source: Ambulatory Visit | Attending: General Surgery | Admitting: General Surgery

## 2016-02-11 DIAGNOSIS — R160 Hepatomegaly, not elsewhere classified: Secondary | ICD-10-CM

## 2016-02-11 MED ORDER — GADOBENATE DIMEGLUMINE 529 MG/ML IV SOLN
16.0000 mL | Freq: Once | INTRAVENOUS | Status: AC | PRN
  Administered 2016-02-11: 16 mL via INTRAVENOUS

## 2016-03-18 ENCOUNTER — Ambulatory Visit: Payer: Self-pay | Admitting: General Surgery

## 2016-03-18 NOTE — H&P (Signed)
[name] 03/18/2016 1:51 PM Location: [facility] Patient #: [id number] DOB: [birth year] Single / Language: [name] / Race: Black or African American Female  History of Present Illness [name] M. Abdirahim Flavell MD; 03/18/2016 2:21 PM) The patient is a 35 year old female who presents to discuss consultation. She comes in for follow-up after being seen by Dr Barry Dienes for her liver hemiangioma. They have elected to just observe these for now since she is asymptomatic. She is still symptomatic with respect to her supraumbilical ventral hernia and is here to discuss surgical intervention. She is scheduled for surgery next week. She denies any changes to her medical history since I last saw her in July. She is still having daily discomfort in that part of her abdomen. She denies any nausea, vomiting, diarrhea or constipation. She denies chest pressure, shortness of breath, dyspnea on exertion, orthopnea. She had a CT scan of her abdomen and pelvis which confirmed a small ventral hernia. The width of the defect was 2.2 cm the vertical height of the defect was about 1-1/2 cm. It only contained fat. There is a little bit of diastases in that area. Also seen on her CT scan was bilateral hepatic masses consistent with hemangiomas. One was large at 4.7 x 6.8 cm in the inferior right hepatic lobe. She denies any dysuria or hematuria. She denies any vomiting or nausea.  Otherwise a comprehensive 10 point review of systems was performed and all systems are negative.   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 Also, if we did resect this hemangioma, I would do  this through a subcostal incision which would minimize effects of liver surgery on her hernia mesh. I will contact Dr. Redmond Pulling about scheduling her for hernia surgery. I did briefly review with liver surgery would entail. The operation and would have risks of bleeding and biliary leakage. I also discussed risk of heart, lung complications, blood clots, and possible death.  Other Problems [name], MD; 03/18/2016 2:22 PM) Anxiety Disorder Back Pain Bladder Problems Gastroesophageal Reflux Disease Hemorrhoids Migraine Headache Umbilical Hernia Repair Sleep Apnea  Past Surgical History [name], MD; 03/18/2016 2:22 PM) Cesarean Section - 1 Foot Surgery Bilateral. Resection of Stomach  Diagnostic Studies History [name], MD; 03/18/2016 2:22 PM) Colonoscopy never Mammogram 1-3 years ago Pap Smear 1-5 years ago  Allergies [name], MD; 03/18/2016 2:22 PM) LevoFLOXacin *FLUOROQUINOLONES* Sertraline HCl *ANTIDEPRESSANTS*  Medication History [name], MD; 03/18/2016 2:22 PM) Medications Reconciled Norco (5-325MG  Tablet, 1-2 Tablet Oral every four hours, as needed, Taken starting 02/21/2016) Active.  Pregnancy / Birth History [name], MD; 03/18/2016 2:22 PM) Age at menarche 39 years. Gravida 5 Irregular periods Maternal age 78-20 Para 3     Review of Systems [name] M. [name] MD; 03/18/2016 2:17 PM) All other systems negative  Vitals Occupational hygienist, BSN; 03/18/2016 1:52 PM) 03/18/2016 1:51 PM Weight: 174.2 lb Height: 63in Body Surface Area: 1.82 m Body Mass Index: 30.86 kg/m  Temp.: 99.57F(Oral)  Pulse: 84 (Regular)  BP: 130/86 (Sitting, Left Arm, Standard)      Physical Exam [name] M. [name] MD; 03/18/2016 2:22 PM)  General Mental Status-Alert. General Appearance-Consistent  with stated age. Hydration-Well hydrated. Voice-Normal. Note: Overweight  Head and Neck Head-normocephalic, atraumatic with  no lesions or palpable masses. Trachea-midline. Thyroid Gland Characteristics - normal size and consistency.  Eye Eyeball - Bilateral-Extraocular movements intact. Sclera/Conjunctiva - Bilateral-No scleral icterus.  Chest and Lung Exam Chest and lung exam reveals -quiet, even and easy respiratory effort with no use of accessory muscles and on auscultation, normal breath sounds, no adventitious sounds and normal vocal resonance. Inspection Chest Wall - Normal. Back - normal.  Breast - Did not examine.  Cardiovascular Cardiovascular examination reveals -normal heart sounds, regular rate and rhythm with no murmurs and normal pedal pulses bilaterally.  Abdomen Inspection  Inspection of the abdomen reveals: Note: She has a palpable bulge in the supraumbilical position. It is soft, nontender. It is now nonreducible. There is no skin changes. The defect may be about 2 cm. It is hard to tell. No other evidence of ventral hernia. She has a slight diastases in her upper abdomen. Skin - Scar - Note: Well-healed C-section scar. Palpation/Percussion Palpation and Percussion of the abdomen reveal - Soft, Non Tender, No Rebound tenderness, No Rigidity (guarding) and No hepatosplenomegaly. Auscultation Auscultation of the abdomen reveals - Bowel sounds normal.  Peripheral Vascular Upper Extremity Palpation - Pulses bilaterally normal.  Neurologic Neurologic evaluation reveals -alert and oriented x 3 with no impairment of recent or remote memory. Mental Status-Normal.  Neuropsychiatric The patient's mood and affect are described as -normal. Judgment and Insight-insight is appropriate concerning matters relevant to self.  Musculoskeletal Normal Exam - Left-Upper Extremity Strength Normal and Lower Extremity Strength Normal. Normal Exam - Right-Upper Extremity Strength Normal and Lower Extremity Strength Normal.  Lymphatic Head & Neck  General Head & Neck  Lymphatics: Bilateral - Description - Normal. Axillary - Did not examine. Femoral & Inguinal - Did not examine.    Assessment & Plan [name] M. 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 It measures around 2.5 x 1.4 cm. It is only containing fat. We discussed the CT results. We discussed the etiology of ventral hernias. We discussed the signs and symptoms of incarceration and strangulation. The patient was given educational material. I also drew diagrams.  We discussed laparoscopic assisted repair of incarcerated ventral hernia with mesh (primary repair of muscles with mesh underlay).  We discussed the risk and benefits of surgery including but not limited to bleeding, infection, injury to surrounding structures, hernia recurrence, mesh complications, hematoma/seroma formation, need to convert to an open procedure, blood clot formation, urinary retention, post operative ileus, general anesthesia risk, long-term abdominal pain. We discussed that this procedure can be quite uncomfortable and difficult to recover from based on how the mesh is secured to the abdominal wall. We discussed the importance of avoiding heavy lifting and straining for a period of 6 weeks.  Current Plans Pt Education - Pamphlet Given - Hernia Surgery: discussed with patient and provided information. 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 Also, if we did resect this hemangioma, I would do this through a subcostal incision which would minimize effects of liver surgery on her hernia mesh.  I will contact Dr. Redmond Pulling about scheduling her for hernia surgery. I did briefly review with liver surgery would entail. The operation and would have risks of bleeding and biliary  leakage. I also  discussed risk of heart, lung complications, blood clots, and possible death. Impression: observation for now per Dr Michaelene Song. Redmond Pulling, MD, FACS General, Bariatric, & Minimally Invasive Surgery [facility], [place]

## 2016-03-21 ENCOUNTER — Encounter (HOSPITAL_COMMUNITY)
Admission: RE | Admit: 2016-03-21 | Discharge: 2016-03-21 | Disposition: A | Discharge status: Home / Self Care | Payer: Medicaid Other | Source: Ambulatory Visit | Attending: General Surgery | Admitting: General Surgery

## 2016-03-21 ENCOUNTER — Encounter (HOSPITAL_COMMUNITY): Payer: Self-pay

## 2016-03-21 DIAGNOSIS — R059 Cough, unspecified: Secondary | ICD-10-CM

## 2016-03-21 DIAGNOSIS — Z01812 Encounter for preprocedural laboratory examination: Secondary | ICD-10-CM | POA: Diagnosis not present

## 2016-03-21 HISTORY — DX: Cough: R05

## 2016-03-21 HISTORY — DX: Cough, unspecified: R05.9

## 2016-03-21 LAB — COMPREHENSIVE METABOLIC PANEL
ALK PHOS: 68 U/L (ref 38–126)
ALT: 13 U/L — AB (ref 14–54)
ANION GAP: 7 (ref 5–15)
AST: 21 U/L (ref 15–41)
Albumin: 4.2 g/dL (ref 3.5–5.0)
BILIRUBIN TOTAL: 0.2 mg/dL — AB (ref 0.3–1.2)
BUN: 15 mg/dL (ref 6–20)
CALCIUM: 9 mg/dL (ref 8.9–10.3)
CO2: 26 mmol/L (ref 22–32)
CREATININE: 0.59 mg/dL (ref 0.44–1.00)
Chloride: 108 mmol/L (ref 101–111)
GFR calc non Af Amer: >60 mL/min (ref >60)
Glucose, Bld: 92 mg/dL (ref 65–99)
Potassium: 3.6 mmol/L (ref 3.5–5.1)
Sodium: 141 mmol/L (ref 135–145)
TOTAL PROTEIN: 7.5 g/dL (ref 6.5–8.1)

## 2016-03-21 LAB — CBC WITH DIFFERENTIAL/PLATELET
Basophils Absolute: 0 10*3/uL (ref 0.0–0.1)
Basophils Relative: 1 %
Eosinophils Absolute: 0.1 10*3/uL (ref 0.0–0.7)
Eosinophils Relative: 2 %
HEMATOCRIT: 35.8 % — AB (ref 36.0–46.0)
HEMOGLOBIN: 12 g/dL (ref 12.0–15.0)
LYMPHS ABS: 2.1 10*3/uL (ref 0.7–4.0)
LYMPHS PCT: 33 %
MCH: 31 pg (ref 26.0–34.0)
MCHC: 33.5 g/dL (ref 30.0–36.0)
MCV: 92.5 fL (ref 78.0–100.0)
MONOS PCT: 6 %
Monocytes Absolute: 0.4 10*3/uL (ref 0.1–1.0)
NEUTROS ABS: 3.7 10*3/uL (ref 1.7–7.7)
NEUTROS PCT: 58 %
Platelets: 204 10*3/uL (ref 150–400)
RBC: 3.87 MIL/uL (ref 3.87–5.11)
RDW: 13.8 % (ref 11.5–15.5)
WBC: 6.3 10*3/uL (ref 4.0–10.5)

## 2016-03-21 LAB — SURGICAL PCR SCREEN
MRSA, PCR: NEGATIVE
STAPHYLOCOCCUS AUREUS: NEGATIVE

## 2016-03-21 LAB — HCG, SERUM, QUALITATIVE: Preg, Serum: NEGATIVE

## 2016-03-21 NOTE — Patient Instructions (Signed)
[name]  03/21/2016   Your procedure is scheduled on: 03/27/16  Report to [facility]  Entrance take [place]  elevators to 3rd floor to  [place] at 0730  AM.  Call this number if you have problems the morning of surgery [phone]   Remember: ONLY 1 PERSON MAY GO WITH YOU TO SHORT STAY TO GET  READY MORNING OF [place].  Do not eat food or drink liquids :After Midnight.     Take these medicines the morning of surgery with A SIP OF WATER: NO REGULAR MEDICATION MAY TAKE HYDROCODONE OR ROBAXIN IF NEEDED Y                               You may not have any metal on your body including hair pins and              piercings  Do not wear jewelry, make-up, lotions, powders or perfumes, deodorant             Do not wear nail polish.  Do not shave  48 hours prior to surgery.              Men may shave face and neck.   Do not bring valuables to the hospital. [place].  Contacts, dentures or bridgework may not be worn into surgery.  Leave suitcase in the car. After surgery it may be brought to your room.             [name] - Preparing for Surgery Before surgery, you can play an important role.  Because skin is not sterile, your skin needs to be as free of germs as possible.  You can reduce the number of germs on your skin by washing with CHG (chlorahexidine gluconate) soap before surgery.  CHG is an antiseptic cleaner which kills germs and bonds with the skin to continue killing germs even after washing. Please DO NOT use if you have an allergy to CHG or antibacterial soaps.  If your skin becomes reddened/irritated stop using the CHG and inform your nurse when you arrive at Short Stay. Do not shave (including legs and underarms) for at least 48 hours prior to the first CHG shower.  You may shave your face/neck. Please follow these instructions carefully:  1.  Shower with CHG Soap the night before  surgery and the  morning of Surgery.  2.  If you choose to wash your hair, wash your hair first as usual with your  normal  shampoo.  3.  After you shampoo, rinse your hair and body thoroughly to remove the  shampoo.                           4.  Use CHG as you would any other liquid soap.  You can apply chg directly  to the skin and wash                       Gently with a scrungie or clean washcloth.  5.  Apply the CHG Soap to your body ONLY FROM THE NECK DOWN.   Do not use on face/ open  Wound or open sores. Avoid contact with eyes, ears mouth and genitals (private parts).                       Wash face,  Genitals (private parts) with your normal soap.             6.  Wash thoroughly, paying special attention to the area where your surgery  will be performed.  7.  Thoroughly rinse your body with warm water from the neck down.  8.  DO NOT shower/wash with your normal soap after using and rinsing off  the CHG Soap.                9.  Pat yourself dry with a clean towel.            10.  Wear clean pajamas.            11.  Place clean sheets on your bed the night of your first shower and do not  sleep with pets. Day of Surgery : Do not apply any lotions/deodorants the morning of surgery.  Please wear clean clothes to the hospital/surgery center.  FAILURE TO FOLLOW THESE INSTRUCTIONS MAY RESULT IN THE CANCELLATION OF YOUR SURGERY PATIENT SIGNATURE_________________________________  NURSE SIGNATURE__________________________________  ________________________________________________________________________

## 2016-03-27 ENCOUNTER — Ambulatory Visit (HOSPITAL_COMMUNITY)
Admission: RE | Admit: 2016-03-27 | Discharge: 2016-03-27 | Disposition: A | Discharge status: Home / Self Care | Payer: Medicaid Other | Source: Ambulatory Visit | Attending: General Surgery | Admitting: General Surgery

## 2016-03-27 ENCOUNTER — Encounter (HOSPITAL_COMMUNITY)
Admission: RE | Disposition: A | Discharge status: Home / Self Care | Payer: Self-pay | Source: Ambulatory Visit | Attending: General Surgery

## 2016-03-27 ENCOUNTER — Ambulatory Visit (HOSPITAL_COMMUNITY): Payer: Medicaid Other | Admitting: Anesthesiology

## 2016-03-27 ENCOUNTER — Encounter (HOSPITAL_COMMUNITY): Payer: Self-pay

## 2016-03-27 DIAGNOSIS — Z6830 Body mass index (BMI) 30.0-30.9, adult: Secondary | ICD-10-CM | POA: Diagnosis not present

## 2016-03-27 DIAGNOSIS — K436 Other and unspecified ventral hernia with obstruction, without gangrene: Secondary | ICD-10-CM | POA: Diagnosis not present

## 2016-03-27 DIAGNOSIS — K430 Incisional hernia with obstruction, without gangrene: Secondary | ICD-10-CM | POA: Diagnosis present

## 2016-03-27 DIAGNOSIS — Z87891 Personal history of nicotine dependence: Secondary | ICD-10-CM | POA: Diagnosis not present

## 2016-03-27 DIAGNOSIS — D1803 Hemangioma of intra-abdominal structures: Secondary | ICD-10-CM | POA: Insufficient documentation

## 2016-03-27 DIAGNOSIS — I10 Essential (primary) hypertension: Secondary | ICD-10-CM | POA: Diagnosis not present

## 2016-03-27 DIAGNOSIS — G4730 Sleep apnea, unspecified: Secondary | ICD-10-CM | POA: Diagnosis not present

## 2016-03-27 DIAGNOSIS — E669 Obesity, unspecified: Secondary | ICD-10-CM | POA: Diagnosis not present

## 2016-03-27 HISTORY — PX: INSERTION OF MESH: SHX5868

## 2016-03-27 HISTORY — DX: Other specified disorders of teeth and supporting structures: K08.89

## 2016-03-27 HISTORY — PX: INCISIONAL HERNIA REPAIR: SHX193

## 2016-03-27 SURGERY — REPAIR, HERNIA, INCISIONAL, LAPAROSCOPIC
Anesthesia: General | Site: Abdomen

## 2016-03-27 MED ORDER — ONDANSETRON HCL 4 MG/2ML IJ SOLN
INTRAMUSCULAR | Status: DC | PRN
  Administered 2016-03-27: 4 mg via INTRAVENOUS

## 2016-03-27 MED ORDER — HYDROMORPHONE HCL 1 MG/ML IJ SOLN
INTRAMUSCULAR | Status: AC
  Administered 2016-03-27: 0.5 mg via INTRAVENOUS
  Filled 2016-03-27: qty 1

## 2016-03-27 MED ORDER — LIDOCAINE HCL (CARDIAC) 20 MG/ML IV SOLN
INTRAVENOUS | Status: DC | PRN
  Administered 2016-03-27: 100 mg via INTRAVENOUS

## 2016-03-27 MED ORDER — MIDAZOLAM HCL 2 MG/2ML IJ SOLN
INTRAMUSCULAR | Status: AC
  Filled 2016-03-27: qty 2

## 2016-03-27 MED ORDER — BUPIVACAINE-EPINEPHRINE (PF) 0.5% -1:200000 IJ SOLN
INTRAMUSCULAR | Status: DC | PRN
  Administered 2016-03-27: 50 mL

## 2016-03-27 MED ORDER — SODIUM CHLORIDE 0.9% FLUSH: 3.0000 mL | Freq: Two times a day (BID) | INTRAVENOUS | Status: DC

## 2016-03-27 MED ORDER — CEFAZOLIN SODIUM-DEXTROSE 2-4 GM/100ML-% IV SOLN
2.0000 g | INTRAVENOUS | Status: AC
  Administered 2016-03-27: 2 g via INTRAVENOUS

## 2016-03-27 MED ORDER — LACTATED RINGERS IV SOLN
INTRAVENOUS | Status: DC
  Administered 2016-03-27: 1000 mL via INTRAVENOUS
  Administered 2016-03-27: 11:00:00 via INTRAVENOUS

## 2016-03-27 MED ORDER — SUGAMMADEX SODIUM 200 MG/2ML IV SOLN
INTRAVENOUS | Status: AC
Start: 2016-03-27 — End: 2016-03-27
  Filled 2016-03-27: qty 2

## 2016-03-27 MED ORDER — ACETAMINOPHEN 325 MG PO TABS: 650.0000 mg | ORAL_TABLET | ORAL | Status: DC | PRN

## 2016-03-27 MED ORDER — PROPOFOL 10 MG/ML IV BOLUS
INTRAVENOUS | Status: DC | PRN
  Administered 2016-03-27: 200 mg via INTRAVENOUS

## 2016-03-27 MED ORDER — SUGAMMADEX SODIUM 200 MG/2ML IV SOLN
INTRAVENOUS | Status: DC | PRN
  Administered 2016-03-27: 200 mg via INTRAVENOUS

## 2016-03-27 MED ORDER — PROPOFOL 10 MG/ML IV BOLUS
INTRAVENOUS | Status: AC
Start: 2016-03-27 — End: 2016-03-27
  Filled 2016-03-27: qty 20

## 2016-03-27 MED ORDER — HYDROMORPHONE HCL 1 MG/ML IJ SOLN
0.2500 mg | INTRAMUSCULAR | Status: DC | PRN
  Administered 2016-03-27 (×2): 0.5 mg via INTRAVENOUS

## 2016-03-27 MED ORDER — CHLORHEXIDINE GLUCONATE CLOTH 2 % EX PADS: 6.0000 | MEDICATED_PAD | Freq: Once | CUTANEOUS | Status: DC

## 2016-03-27 MED ORDER — FENTANYL CITRATE (PF) 100 MCG/2ML IJ SOLN
INTRAMUSCULAR | Status: AC
  Filled 2016-03-27: qty 4

## 2016-03-27 MED ORDER — FENTANYL CITRATE (PF) 100 MCG/2ML IJ SOLN
INTRAMUSCULAR | Status: DC | PRN
  Administered 2016-03-27 (×2): 100 ug via INTRAVENOUS

## 2016-03-27 MED ORDER — LACTATED RINGERS IR SOLN
Status: DC | PRN
  Administered 2016-03-27: 1000 mL

## 2016-03-27 MED ORDER — SODIUM CHLORIDE 0.9 % IV SOLN: 250.0000 mL | INTRAVENOUS | Status: DC | PRN

## 2016-03-27 MED ORDER — OXYCODONE HCL 5 MG PO TABS
5.0000 mg | ORAL_TABLET | ORAL | Status: DC | PRN
  Administered 2016-03-27: 10 mg via ORAL
  Filled 2016-03-27: qty 2

## 2016-03-27 MED ORDER — ROCURONIUM BROMIDE 100 MG/10ML IV SOLN
INTRAVENOUS | Status: DC | PRN
  Administered 2016-03-27: 50 mg via INTRAVENOUS
  Administered 2016-03-27: 10 mg via INTRAVENOUS

## 2016-03-27 MED ORDER — SODIUM CHLORIDE 0.9% FLUSH: 3.0000 mL | INTRAVENOUS | Status: DC | PRN

## 2016-03-27 MED ORDER — BUPIVACAINE-EPINEPHRINE 0.5% -1:200000 IJ SOLN
INTRAMUSCULAR | Status: AC
  Filled 2016-03-27: qty 1

## 2016-03-27 MED ORDER — GABAPENTIN 300 MG PO CAPS
300.0000 mg | ORAL_CAPSULE | ORAL | Status: AC
  Administered 2016-03-27: 300 mg via ORAL
  Filled 2016-03-27: qty 1

## 2016-03-27 MED ORDER — ACETAMINOPHEN 650 MG RE SUPP
650.0000 mg | RECTAL | Status: DC | PRN
Start: 2016-03-27 — End: 2016-03-27
  Filled 2016-03-27: qty 1

## 2016-03-27 MED ORDER — KETOROLAC TROMETHAMINE 30 MG/ML IJ SOLN
INTRAMUSCULAR | Status: AC
Start: 2016-03-27 — End: 2016-03-27
  Filled 2016-03-27: qty 1

## 2016-03-27 MED ORDER — SODIUM CHLORIDE 0.9 % IV SOLN
INTRAVENOUS | Status: DC
Start: 2016-03-27 — End: 2016-03-27

## 2016-03-27 MED ORDER — ACETAMINOPHEN 500 MG PO TABS
1000.0000 mg | ORAL_TABLET | Freq: Four times a day (QID) | ORAL | Status: DC
  Filled 2016-03-27 (×4): qty 2

## 2016-03-27 MED ORDER — ONDANSETRON HCL 4 MG/2ML IJ SOLN
INTRAMUSCULAR | Status: AC
  Filled 2016-03-27: qty 2

## 2016-03-27 MED ORDER — OXYCODONE HCL 5 MG PO TABS: 5.0000 mg | ORAL_TABLET | ORAL | 0 refills | Status: DC | PRN

## 2016-03-27 MED ORDER — ROCURONIUM BROMIDE 100 MG/10ML IV SOLN
INTRAVENOUS | Status: AC
  Filled 2016-03-27: qty 1

## 2016-03-27 MED ORDER — MIDAZOLAM HCL 5 MG/5ML IJ SOLN
INTRAMUSCULAR | Status: DC | PRN
  Administered 2016-03-27: 2 mg via INTRAVENOUS

## 2016-03-27 MED ORDER — 0.9 % SODIUM CHLORIDE (POUR BTL) OPTIME
TOPICAL | Status: DC | PRN
  Administered 2016-03-27: 1000 mL

## 2016-03-27 MED ORDER — CEFAZOLIN SODIUM-DEXTROSE 2-4 GM/100ML-% IV SOLN
INTRAVENOUS | Status: AC
  Filled 2016-03-27: qty 100

## 2016-03-27 MED ORDER — PROMETHAZINE HCL 25 MG/ML IJ SOLN: 6.2500 mg | INTRAMUSCULAR | Status: DC | PRN

## 2016-03-27 MED ORDER — KETOROLAC TROMETHAMINE 30 MG/ML IJ SOLN
INTRAMUSCULAR | Status: DC | PRN
  Administered 2016-03-27: 30 mg via INTRAVENOUS

## 2016-03-27 MED ORDER — FENTANYL CITRATE (PF) 100 MCG/2ML IJ SOLN: 25.0000 ug | INTRAMUSCULAR | Status: DC | PRN

## 2016-03-27 MED ORDER — ACETAMINOPHEN 500 MG PO TABS
1000.0000 mg | ORAL_TABLET | ORAL | Status: AC
  Administered 2016-03-27: 1000 mg via ORAL
  Filled 2016-03-27: qty 2

## 2016-03-27 MED ORDER — LIDOCAINE 2% (20 MG/ML) 5 ML SYRINGE
INTRAMUSCULAR | Status: AC
  Filled 2016-03-27: qty 5

## 2016-03-27 SURGICAL SUPPLY — 48 items
ADH SKN CLS APL DERMABOND .7 (GAUZE/BANDAGES/DRESSINGS)
APL SKNCLS STERI-STRIP NONHPOA (GAUZE/BANDAGES/DRESSINGS) ×1
APPLIER CLIP LOGIC TI 5 (MISCELLANEOUS) IMPLANT
APR CLP MED LRG 33X5 (MISCELLANEOUS)
BENZOIN TINCTURE PRP APPL 2/3 (GAUZE/BANDAGES/DRESSINGS) ×1 IMPLANT
BINDER ABDOMINAL 12 ML 46-62 (SOFTGOODS) ×1 IMPLANT
CABLE HIGH FREQUENCY MONO STRZ (ELECTRODE) ×2 IMPLANT
CHLORAPREP W/TINT 26ML (MISCELLANEOUS) ×3 IMPLANT
COVER SURGICAL LIGHT HANDLE (MISCELLANEOUS) ×2 IMPLANT
DECANTER SPIKE VIAL GLASS SM (MISCELLANEOUS) ×2 IMPLANT
DERMABOND ADVANCED (GAUZE/BANDAGES/DRESSINGS)
DERMABOND ADVANCED .7 DNX12 (GAUZE/BANDAGES/DRESSINGS) IMPLANT
DEVICE SECURE STRAP 25 ABSORB (INSTRUMENTS) ×1 IMPLANT
DEVICE TROCAR PUNCTURE CLOSURE (ENDOMECHANICALS) ×1 IMPLANT
DRAPE INCISE IOBAN 66X45 STRL (DRAPES) IMPLANT
DRAPE UTILITY XL STRL (DRAPES) IMPLANT
DRSG TEGADERM 2-3/8X2-3/4 SM (GAUZE/BANDAGES/DRESSINGS) ×1 IMPLANT
ELECT PENCIL ROCKER SW 15FT (MISCELLANEOUS) ×1 IMPLANT
ELECT REM PT RETURN 9FT ADLT (ELECTROSURGICAL) ×2
ELECTRODE REM PT RTRN 9FT ADLT (ELECTROSURGICAL) ×1 IMPLANT
GAUZE SPONGE 2X2 8PLY STRL LF (GAUZE/BANDAGES/DRESSINGS) IMPLANT
GOWN STRL REUS W/TWL XL LVL3 (GOWN DISPOSABLE) ×6 IMPLANT
GRASPER SUT TROCAR 14GX15 (MISCELLANEOUS) IMPLANT
IRRIG SUCT STRYKERFLOW 2 WTIP (MISCELLANEOUS) ×2
IRRIGATION SUCT STRKRFLW 2 WTP (MISCELLANEOUS) IMPLANT
KIT BASIN OR (CUSTOM PROCEDURE TRAY) ×2 IMPLANT
L-HOOK LAP DISP 36CM (ELECTROSURGICAL)
LHOOK LAP DISP 36CM (ELECTROSURGICAL) IMPLANT
MARKER SKIN DUAL TIP RULER LAB (MISCELLANEOUS) ×2 IMPLANT
MESH VENTRALEX ST 8CM LRG (Mesh General) ×1 IMPLANT
NDL SPNL 22GX3.5 QUINCKE BK (NEEDLE) ×1 IMPLANT
NEEDLE SPNL 22GX3.5 QUINCKE BK (NEEDLE) ×2 IMPLANT
SCISSORS LAP 5X35 DISP (ENDOMECHANICALS) ×2 IMPLANT
SHEARS HARMONIC ACE PLUS 36CM (ENDOMECHANICALS) IMPLANT
SLEEVE XCEL OPT CAN 5 100 (ENDOMECHANICALS) ×2 IMPLANT
SPONGE GAUZE 2X2 STER 10/PKG (GAUZE/BANDAGES/DRESSINGS) ×1
STRIP CLOSURE SKIN 1/2X4 (GAUZE/BANDAGES/DRESSINGS) ×1 IMPLANT
SUT MNCRL AB 4-0 PS2 18 (SUTURE) ×2 IMPLANT
SUT NOVA NAB DX-16 0-1 5-0 T12 (SUTURE) ×2 IMPLANT
SUT NOVA NAB GS-21 0 18 T12 DT (SUTURE) IMPLANT
TOWEL OR 17X26 10 PK STRL BLUE (TOWEL DISPOSABLE) ×2 IMPLANT
TOWEL OR NON WOVEN STRL DISP B (DISPOSABLE) ×2 IMPLANT
TRAY FOLEY CATH 14FRSI W/METER (CATHETERS) ×1 IMPLANT
TRAY LAPAROSCOPIC (CUSTOM PROCEDURE TRAY) ×2 IMPLANT
TROCAR BLADELESS OPT 5 100 (ENDOMECHANICALS) ×2 IMPLANT
TROCAR XCEL BLUNT TIP 100MML (ENDOMECHANICALS) IMPLANT
TROCAR XCEL NON-BLD 11X100MML (ENDOMECHANICALS) IMPLANT
TUBING INSUF HEATED (TUBING) ×1 IMPLANT

## 2016-03-27 NOTE — Anesthesia Procedure Notes (Signed)
Procedure Name: Intubation Date/Time: 03/27/2016 9:39 AM Performed by: Glory Buff Pre-anesthesia Checklist: Patient identified, Emergency Drugs available, Suction available and Patient being monitored Patient Re-evaluated:Patient Re-evaluated prior to inductionOxygen Delivery Method: Circle system utilized Preoxygenation: Pre-oxygenation with 100% oxygen Intubation Type: IV induction Ventilation: Mask ventilation without difficulty Laryngoscope Size: Miller and 3 Grade View: Grade I Tube type: Oral Tube size: 7.5 mm Number of attempts: 1 Airway Equipment and Method: Stylet and Oral airway Placement Confirmation: ETT inserted through vocal cords under direct vision,  positive ETCO2 and breath sounds checked- equal and bilateral Secured at: 20 cm Tube secured with: Tape Dental Injury: Teeth and Oropharynx as per pre-operative assessment

## 2016-03-27 NOTE — Anesthesia Preprocedure Evaluation (Addendum)
Anesthesia Evaluation  Patient identified by MRN, date of birth, ID band Patient awake    Reviewed: Allergy & Precautions, NPO status , Patient's Chart, lab work & pertinent test results  Airway Mallampati: II  TM Distance: >3 FB Neck ROM: Full    Dental no notable dental hx.    Pulmonary neg pulmonary ROS, former smoker,    Pulmonary exam normal breath sounds clear to auscultation       Cardiovascular hypertension, Normal cardiovascular exam Rhythm:Regular Rate:Normal     Neuro/Psych  Headaches, PSYCHIATRIC DISORDERS Anxiety Depression    GI/Hepatic Neg liver ROS, GERD  ,  Endo/Other  negative endocrine ROS  Renal/GU negative Renal ROS  negative genitourinary   Musculoskeletal negative musculoskeletal ROS (+)   Abdominal (+) + obese,   Peds negative pediatric ROS (+)  Hematology negative hematology ROS (+)   Anesthesia Other Findings   Reproductive/Obstetrics Negative pregnancy test 9/8                           Anesthesia Physical Anesthesia Plan  ASA: II  Anesthesia Plan: General   Post-op Pain Management:    Induction: Intravenous  Airway Management Planned: Oral ETT  Additional Equipment:   Intra-op Plan:   Post-operative Plan: Extubation in OR  Informed Consent: I have reviewed the patients History and Physical, chart, labs and discussed the procedure including the risks, benefits and alternatives for the proposed anesthesia with the patient or authorized representative who has indicated his/her understanding and acceptance.   Dental advisory given  Plan Discussed with: CRNA  Anesthesia Plan Comments:         Anesthesia Quick Evaluation

## 2016-03-27 NOTE — Discharge Instructions (Signed)
[facility]  UMBILICAL OR INGUINAL HERNIA REPAIR: POST OP INSTRUCTIONS  Always review your discharge instruction sheet given to you by the facility where your surgery was performed. IF YOU HAVE DISABILITY OR FAMILY LEAVE FORMS, YOU MUST BRING THEM TO THE OFFICE FOR PROCESSING.   DO NOT GIVE THEM TO YOUR DOCTOR.  1. A  prescription for pain medication may be given to you upon discharge.  Take your pain medication as prescribed, if needed.  If narcotic pain medicine is not needed, then you should take acetaminophen (Tylenol) and/or ibuprofen (Advil) follow package directions as needed. 2. Take your usually prescribed medications unless otherwise directed. 3. If you need a refill on your pain medication, please contact your pharmacy.  They will contact our office to request authorization. Prescriptions will not be filled after 5 pm or on week-ends. 4. You should follow a light diet the first 24 hours after arrival home, such as soup and crackers, etc.  Be sure to include lots of fluids daily.  Resume your normal diet the day after surgery. 5. Most patients will experience some swelling and bruising around the umbilicus or in the groin and scrotum.  Ice packs and reclining will help.  Swelling and bruising can take several days to resolve.  6. It is common to experience some constipation if taking pain medication after surgery.  Increasing fluid intake and taking a stool softener (such as Colace) will usually help or prevent this problem from occurring.  A mild laxative (Milk of Magnesia or Miralax) should be taken according to package directions if there are no bowel movements after 48 hours. 7. Unless discharge instructions indicate otherwise, you may remove your bandages 48 hours after surgery, and you may shower at that time.  You  have steri-strips (small skin tapes) in place directly over the incision.  These strips should be left on the skin for 7-10 days.  8. ACTIVITIES:  You  may resume regular (light) daily activities beginning the next day--such as daily self-care, walking, climbing stairs--gradually increasing activities as tolerated.  You may have sexual intercourse when it is comfortable.  Refrain from any heavy lifting or straining until approved by your doctor. a. You may drive when you are no longer taking prescription pain medication, you can comfortably wear a seatbelt, and you can safely maneuver your car and apply brakes. b. RETURN TO WORK: DO NOT LIFT, PUSH, OR PULL ANYTHING GREATER THAN 10LBS FOR 4 WEEKS, THAN NOTHING MORE THAN 20 LBS FOR THE NEXT 2 WEEKS. AT 6 WEEKS FROM DATE OF SURGERY - YOU HAVE NO RESTRICTIONS 9. You should see your doctor in the office for a follow-up appointment approximately 2-3 weeks after your surgery.  Make sure that you call for this appointment within a day or two after you arrive home to insure a convenient appointment time. 10. OTHER INSTRUCTIONS: WEAR ABDOMINAL BINDER FOR 30 DAYS    WHEN TO CALL YOUR DOCTOR: 1. Fever over 101.0 2. Inability to urinate 3. Nausea and/or vomiting 4. Extreme swelling or bruising 5. Continued bleeding from incision. 6. Increased pain, redness, or drainage from the incision  The clinic staff is available to answer your questions during regular business hours.  Please dont hesitate to call and ask to speak to one of the nurses for clinical concerns.  If you have a medical emergency, go to the nearest emergency room or call 911.  A surgeon from [facility] is always on call at the hospital  [address], [place], [place], [place]  [address] ?  [address], [place], [place]   [address] [phone] ? [phone] ? FAX [phone] Web site: [email or web address]

## 2016-03-27 NOTE — Anesthesia Postprocedure Evaluation (Signed)
Anesthesia Post Note  Patient: LANYAH PARTINGTON  Procedure(s) Performed: Procedure(s) (LRB): LAPAROSCOPIC ASSISTED REPAIR OF INCARCERATED  INCISIONAL HERNIA (N/A) INSERTION OF MESH (N/A)  Patient location during evaluation: PACU Anesthesia Type: General Level of consciousness: awake and alert Pain management: pain level controlled Vital Signs Assessment: post-procedure vital signs reviewed and stable Respiratory status: spontaneous breathing, nonlabored ventilation, respiratory function stable and patient connected to nasal cannula oxygen Cardiovascular status: blood pressure returned to baseline and stable Postop Assessment: no signs of nausea or vomiting Anesthetic complications: no    Last Vitals:  Vitals:   03/27/16 1200 03/27/16 1218  BP: (!) 131/92 140/82  Pulse: 73 70  Resp: 14 16  Temp: 36.8 C 36.8 C    Last Pain:  Vitals:   03/27/16 1225  TempSrc:   PainSc: 5                  Aniceto Kyser J

## 2016-03-27 NOTE — Transfer of Care (Signed)
Immediate Anesthesia Transfer of Care Note  Patient: [name]  Procedure(s) Performed: Procedure(s): LAPAROSCOPIC ASSISTED REPAIR OF INCARCERATED  INCISIONAL HERNIA (N/A) INSERTION OF MESH (N/A)  Patient Location: PACU  Anesthesia Type:General  Level of Consciousness: awake, alert  and oriented  Airway & Oxygen Therapy: Patient Spontanous Breathing and Patient connected to face mask oxygen  Post-op Assessment: Report given to RN and Post -op Vital signs reviewed and stable  Post vital signs: Reviewed and stable  Last Vitals:  Vitals:   03/27/16 0736  BP: (!) 135/93  Pulse: 66  Resp: 16  Temp: 36.9 C    Last Pain:  Vitals:   03/27/16 0736  TempSrc: Oral      Patients Stated Pain Goal: 4 ([id number])  Complications: No apparent anesthesia complications

## 2016-03-27 NOTE — H&P (View-Only) (Signed)
[name] 03/18/2016 1:51 PM Location: [facility] Patient #: [id number] DOB: July 07, 1981 Single / Language: [name] / Race: Black or African American Female  History of Present Illness Marilyn Hiss M. Ariyan Sinnett MD; 03/18/2016 2:21 PM) The patient is a 35 year old female who presents to discuss consultation. She comes in for follow-up after being seen by Dr Barry Dienes for her liver hemiangioma. They have elected to just observe these for now since she is asymptomatic. She is still symptomatic with respect to her supraumbilical ventral hernia and is here to discuss surgical intervention. She is scheduled for surgery next week. She denies any changes to her medical history since I last saw her in July. She is still having daily discomfort in that part of her abdomen. She denies any nausea, vomiting, diarrhea or constipation. She denies chest pressure, shortness of breath, dyspnea on exertion, orthopnea. She had a CT scan of her abdomen and pelvis which confirmed a small ventral hernia. The width of the defect was 2.2 cm the vertical height of the defect was about 1-1/2 cm. It only contained fat. There is a little bit of diastases in that area. Also seen on her CT scan was bilateral hepatic masses consistent with hemangiomas. One was large at 4.7 x 6.8 cm in the inferior right hepatic lobe. She denies any dysuria or hematuria. She denies any vomiting or nausea.  Otherwise a comprehensive 10 point review of systems was performed and all systems are negative.   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 Also, if we did resect this hemangioma, I would do  this through a subcostal incision which would minimize effects of liver surgery on her hernia mesh. I will contact Dr. Redmond Pulling about scheduling her for hernia surgery. I did briefly review with liver surgery would entail. The operation and would have risks of bleeding and biliary leakage. I also discussed risk of heart, lung complications, blood clots, and possible death.  Other Problems Marilyn Curry, MD; 03/18/2016 2:22 PM) Anxiety Disorder Back Pain Bladder Problems Gastroesophageal Reflux Disease Hemorrhoids Migraine Headache Umbilical Hernia Repair Sleep Apnea  Past Surgical History Marilyn Curry, MD; 03/18/2016 2:22 PM) Cesarean Section - 1 Foot Surgery Bilateral. Resection of Stomach  Diagnostic Studies History Marilyn Curry, MD; 03/18/2016 2:22 PM) Colonoscopy never Mammogram 1-3 years ago Pap Smear 1-5 years ago  Allergies Marilyn Curry, MD; 03/18/2016 2:22 PM) LevoFLOXacin *FLUOROQUINOLONES* Sertraline HCl *ANTIDEPRESSANTS*  Medication History Marilyn Curry, MD; 03/18/2016 2:22 PM) Medications Reconciled Norco (5-325MG  Tablet, 1-2 Tablet Oral every four hours, as needed, Taken starting 02/21/2016) Active.  Pregnancy / Birth History Marilyn Curry, MD; 03/18/2016 2:22 PM) Age at menarche 49 years. Gravida 5 Irregular periods Maternal age 93-20 Para 3     Review of Systems Marilyn Hiss M. Kenyen Candy MD; 03/18/2016 2:17 PM) All other systems negative  Vitals Occupational hygienist, BSN; 03/18/2016 1:52 PM) 03/18/2016 1:51 PM Weight: 174.2 lb Height: 63in Body Surface Area: 1.82 m Body Mass Index: 30.86 kg/m  Temp.: 99.56F(Oral)  Pulse: 84 (Regular)  BP: 130/86 (Sitting, Left Arm, Standard)      Physical Exam Marilyn Hiss M. Elois Averitt MD; 03/18/2016 2:22 PM)  General Mental Status-Alert. General Appearance-Consistent  with stated age. Hydration-Well hydrated. Voice-Normal. Note: Overweight  Head and Neck Head-normocephalic, atraumatic with  no lesions or palpable masses. Trachea-midline. Thyroid Gland Characteristics - normal size and consistency.  Eye Eyeball - Bilateral-Extraocular movements intact. Sclera/Conjunctiva - Bilateral-No scleral icterus.  Chest and Lung Exam Chest and lung exam reveals -quiet, even and easy respiratory effort with no use of accessory muscles and on auscultation, normal breath sounds, no adventitious sounds and normal vocal resonance. Inspection Chest Wall - Normal. Back - normal.  Breast - Did not examine.  Cardiovascular Cardiovascular examination reveals -normal heart sounds, regular rate and rhythm with no murmurs and normal pedal pulses bilaterally.  Abdomen Inspection  Inspection of the abdomen reveals: Note: She has a palpable bulge in the supraumbilical position. It is soft, nontender. It is now nonreducible. There is no skin changes. The defect may be about 2 cm. It is hard to tell. No other evidence of ventral hernia. She has a slight diastases in her upper abdomen. Skin - Scar - Note: Well-healed C-section scar. Palpation/Percussion Palpation and Percussion of the abdomen reveal - Soft, Non Tender, No Rebound tenderness, No Rigidity (guarding) and No hepatosplenomegaly. Auscultation Auscultation of the abdomen reveals - Bowel sounds normal.  Peripheral Vascular Upper Extremity Palpation - Pulses bilaterally normal.  Neurologic Neurologic evaluation reveals -alert and oriented x 3 with no impairment of recent or remote memory. Mental Status-Normal.  Neuropsychiatric The patient's mood and affect are described as -normal. Judgment and Insight-insight is appropriate concerning matters relevant to self.  Musculoskeletal Normal Exam - Left-Upper Extremity Strength Normal and Lower Extremity Strength Normal. Normal Exam - Right-Upper Extremity Strength Normal and Lower Extremity Strength Normal.  Lymphatic Head & Neck  General Head & Neck  Lymphatics: Bilateral - Description - Normal. Axillary - Did not examine. Femoral & Inguinal - Did not examine.    Assessment & Plan Marilyn Hiss M. Elise Gladden MD; 03/18/2016 2:22 PM)  INCARCERATED VENTRAL HERNIA (K46.0) Impression: She does have a small ventral incisional hernia. It measures around 2.5 x 1.4 cm. It is only containing fat. We discussed the CT results. We discussed the etiology of ventral hernias. We discussed the signs and symptoms of incarceration and strangulation. The patient was given educational material. I also drew diagrams.  We discussed laparoscopic assisted repair of incarcerated ventral hernia with mesh (primary repair of muscles with mesh underlay).  We discussed the risk and benefits of surgery including but not limited to bleeding, infection, injury to surrounding structures, hernia recurrence, mesh complications, hematoma/seroma formation, need to convert to an open procedure, blood clot formation, urinary retention, post operative ileus, general anesthesia risk, long-term abdominal pain. We discussed that this procedure can be quite uncomfortable and difficult to recover from based on how the mesh is secured to the abdominal wall. We discussed the importance of avoiding heavy lifting and straining for a period of 6 weeks.  Current Plans Pt Education - Pamphlet Given - Hernia Surgery: discussed with patient and provided information. 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 Also, if we did resect this hemangioma, I would do this through a subcostal incision which would minimize effects of liver surgery on her hernia mesh.  I will contact Dr. Redmond Pulling about scheduling her for hernia surgery. I did briefly review with liver surgery would entail. The operation and would have risks of bleeding and biliary  leakage. I also  discussed risk of heart, lung complications, blood clots, and possible death. Impression: observation for now per Dr Michaelene Song. Redmond Pulling, MD, FACS General, Bariatric, & Minimally Invasive Surgery [facility], [place]

## 2016-03-27 NOTE — Op Note (Signed)
[name] [id number] [birth year] 03/27/2016   Laparoscopic Assisted Repair of Incarcerated Supraumbilical Ventral Hernia with mesh Procedure Note  Indications: Symptomatic incarcerated ventral hernia. Please see H&P for further details  Pre-operative Diagnosis: Incarcerated supraumbilical ventral hernia  Post-operative Diagnosis: Incarcerated supraumbilical ventral hernia  Surgeon: [name]   Assistants: None  Anesthesia: General endotracheal anesthesia  Procedure Details  The patient was seen in the Holding Room. The risks, benefits, complications, treatment options, and expected outcomes were discussed with the patient. The possibilities of reaction to medication, pulmonary aspiration, perforation of viscus, bleeding, recurrent infection, the need for additional procedures, failure to diagnose a condition, and creating a complication requiring transfusion or operation were discussed with the patient. The patient concurred with the proposed plan, giving informed consent.  The site of surgery properly noted/marked. The patient was taken to the operating room, identified as [name] and the procedure verified as laparoscopic assisted ventral hernia repair with mesh. A Time Out was held and the above information confirmed.    The patient was placed supine.  After establishing general anesthesia, a Foley catheter was placed.  The abdomen was prepped with Chloraprep and draped in standard fashion.  A 5 mm Optiview was used the cannulate the peritoneal cavity in the left upper quadrant below the costal margin.  Pneumoperitoneum was obtained by insufflating CO2, maintaining a maximum pressure of 15 mmHg.  The 5 mm 30-degree laparoscopic was inserted.  There were no significant omental adhesions to the anterior abdominal wall in and around the hernia defect. The hernia defect was in the supraumbilical position at the takeoff of the falciform ligament. It contained a plug of fat. Her uterus was  adhered to her abdominal wall in the lower midline in the suprapubic location. This was left alone.    Another 5-mm port was placed in the left lower quadrant.  Endo Shears with electrocautery was used to take down the falciform ligament to clear room for mesh. The plug of fat was reduced from the hernia and taken down with EndoShears with electrocautery.  We cleared the entire abdominal wall and were able to visualize 1 fascial defects.    I then decided to make a small supraumbilical vertical incision because the patient denies any discussed primarily reapproximating her abdominal wall muscles with the mesh underlay. A 4 cm incision was made in the supraumbilical position with the 15 blade. Deep dermis was divided with the electrocautery. Dissection was carried down to the hernia sac located above the fascia and was mobilized from surrounding structures. Additional herniated fat was discarded. Intact fascia was identified circumferentially around the defect. Skin and soft tissue was mobilized from the surface of the fascia in a circumferential manner. A finger sweep was performed underneath the fascia to ensure there were no adhesions to the anterior abdominal wall. This left a defect measuring 1 cm vertical by 2 cm horizontal. Therefore I elected to use an 8 cm round piece of Bard ventralex patch. A 0 Novafil was placed on opposite sides of the mesh to serve as a transfacial suture. The tails of the mesh were then trimmed. The mesh was then placed through the fascial defect.. The fascia was then closed primarily over the mesh with 4 interrupted 1-novafil sutures.  Pneumoperitoneum was reestablished and the mesh was unrolled.  The stay sutures were then pulled up through small stab incisions using the Endo-close device on opposite sides.  This deployed the mesh widely over the fascial defect.  The stay  sutures were then tied down.  The Secure Strap device was then used to tack down the edges of the mesh at 1  cm intervals circumferentially.  We placed a few tacks inside the outer ring of tacks.  We inspected for hemostasis. Additional local was infiltrated in the preperitoneal space.  I visualize my optical entry site and there is no evidence of injury to surrounding bowel.  Pneumoperitoneum was then released as we removed the remainder of the trocars.  The port sites were closed with 4-0 Monocryl. The small mini midline incision was closed with a 3-0 Vicryl in the deep dermis and then a 4-0 Monocryl in a subcuticular fashion.  All of the incisions and stay suture sites were then covered with benzoin, Steri-Strips and sterile bandages.  An abdominal binder was placed around the patient's abdomen.  The patient was extubated and brought to the recovery room in stable condition.  All sponge, instrument, and needle counts were correct prior to closure and at the conclusion of the case.   Findings: Type of repair - primary suture with mesh underlay  (choices - primary suture, mesh, or component)  Name of mesh - Bard ventralex patch  Size of mesh - round 8 cm  Mesh overlap - >5 cm  Placement of mesh - beneath fascia and into peritoneal cavity   Estimated Blood Loss:  Minimal         Complications:  None; patient tolerated the procedure well.         Disposition: PACU - hemodynamically stable.         Condition: stable  Leighton Ruff. Redmond Pulling, MD, FACS General, Bariatric, & Minimally Invasive Surgery [facility] Surgery, [place]

## 2016-03-27 NOTE — Interval H&P Note (Signed)
History and Physical Interval Note:  03/27/2016 8:57 AM  [name]  has presented today for surgery, with the diagnosis of incarcerated incisional hernia  The various methods of treatment have been discussed with the patient and family. After consideration of risks, benefits and other options for treatment, the patient has consented to  Procedure(s): [place] (N/A) INSERTION OF MESH (N/A) as a surgical intervention .  The patient's history has been reviewed, patient examined, no change in status, stable for surgery.  I have reviewed the patient's chart and labs.  Questions were answered to the patient's satisfaction.    Leighton Ruff. Redmond Pulling, MD, [place], Bariatric, & Minimally Invasive Surgery [facility], [place]   [facility] M

## 2016-09-05 ENCOUNTER — Ambulatory Visit (INDEPENDENT_AMBULATORY_CARE_PROVIDER_SITE_OTHER): Payer: Medicaid Other

## 2016-09-05 ENCOUNTER — Ambulatory Visit (HOSPITAL_COMMUNITY)
Admission: EM | Admit: 2016-09-05 | Discharge: 2016-09-05 | Disposition: A | Discharge status: Home / Self Care | Payer: Medicaid Other | Attending: Family Medicine | Admitting: Family Medicine

## 2016-09-05 ENCOUNTER — Encounter (HOSPITAL_COMMUNITY): Payer: Self-pay

## 2016-09-05 DIAGNOSIS — N898 Other specified noninflammatory disorders of vagina: Secondary | ICD-10-CM

## 2016-09-05 DIAGNOSIS — J328 Other chronic sinusitis: Secondary | ICD-10-CM | POA: Diagnosis not present

## 2016-09-05 DIAGNOSIS — R0981 Nasal congestion: Secondary | ICD-10-CM | POA: Diagnosis not present

## 2016-09-05 DIAGNOSIS — F329 Major depressive disorder, single episode, unspecified: Secondary | ICD-10-CM | POA: Insufficient documentation

## 2016-09-05 DIAGNOSIS — L298 Other pruritus: Secondary | ICD-10-CM | POA: Insufficient documentation

## 2016-09-05 DIAGNOSIS — N9089 Other specified noninflammatory disorders of vulva and perineum: Secondary | ICD-10-CM

## 2016-09-05 DIAGNOSIS — F419 Anxiety disorder, unspecified: Secondary | ICD-10-CM | POA: Insufficient documentation

## 2016-09-05 DIAGNOSIS — Z87891 Personal history of nicotine dependence: Secondary | ICD-10-CM | POA: Diagnosis not present

## 2016-09-05 MED ORDER — METRONIDAZOLE 500 MG PO TABS: 500.0000 mg | ORAL_TABLET | Freq: Two times a day (BID) | ORAL | 0 refills | Status: DC

## 2016-09-05 MED ORDER — CLOTRIMAZOLE 1 % VA CREA: 1.0000 | TOPICAL_CREAM | Freq: Every day | VAGINAL | 0 refills | Status: DC

## 2016-09-05 MED ORDER — IPRATROPIUM BROMIDE 0.06 % NA SOLN: 2.0000 | Freq: Four times a day (QID) | NASAL | 0 refills | Status: DC

## 2016-09-05 NOTE — ED Triage Notes (Signed)
Possible sinus infx for two weeks. Headache, facial pain, feeling lightheaded when she stand up. Also having drainage. No otc meds taken, no fever.

## 2016-09-05 NOTE — ED Triage Notes (Signed)
Also complains of vaginal itching for 2 days. No discharge but itching and now burning when she urinates. Said she irritated it with some new soap she used. Also wants to be checked for Bv. Also said she is having foul odor.

## 2016-09-05 NOTE — ED Provider Notes (Signed)
CSN: [id number]     Arrival date & time 09/05/16  1025 History   None    Chief Complaint  Patient presents with  . Recurrent Sinusitis  . Vaginal Itching   (Consider location/radiation/quality/duration/timing/severity/associated sxs/prior Treatment)  Vaginal Itching     Past Medical History:  Diagnosis Date  . Anxiety   . Cough 03/21/2016   no fever, states coughing up "snot colored cold stuff"-  has had this x 1.5 weeks and is getting much better- taking tylenol cold  . Depression    never been treated , "probably got some "  . Headache(784.0)   . Tooth ache    right upper molar partially broke off   Past Surgical History:  Procedure Laterality Date  . CESAREAN SECTION    . CESAREAN SECTION WITH BILATERAL TUBAL LIGATION Bilateral 11/03/2014   Procedure: CESAREAN SECTION WITH BILATERAL TUBAL LIGATION;  Surgeon: Shelly Bombard, MD;  Location: [facility];  Service: Obstetrics;  Laterality: Bilateral;  . CHOLECYSTECTOMY, LAPAROSCOPIC    . DILATION AND CURETTAGE OF UTERUS    . INCISIONAL HERNIA REPAIR N/A 03/27/2016   Procedure: LAPAROSCOPIC ASSISTED REPAIR OF INCARCERATED  INCISIONAL HERNIA;  Surgeon: Greer Pickerel, MD;  Location: [facility];  Service: General;  Laterality: N/A;  . INSERTION OF MESH N/A 03/27/2016   Procedure: INSERTION OF MESH;  Surgeon: Greer Pickerel, MD;  Location: [facility];  Service: General;  Laterality: N/A;  . THERAPEUTIC ABORTION     Family History  Problem Relation Age of Onset  . Diabetes Father   . Hypertension Father   . Cancer Maternal Aunt     breast   Social History  Substance Use Topics  . Smoking status: Former Smoker    Types: Cigarettes    Quit date: 07/15/2015  . Smokeless tobacco: Never Used     Comment: prior to preg  . Alcohol use No   OB History    Gravida Para Term Preterm AB Living   8 3 3  0 4 3   SAB TAB Ectopic Multiple Live Births   1 3   0 2     Review of Systems  Constitutional: Negative.   HENT: Positive for congestion.    Eyes: Negative.   Respiratory: Negative.   Cardiovascular: Negative.   Gastrointestinal: Negative.   Endocrine: Negative.   Genitourinary: Positive for vaginal discharge.  Musculoskeletal: Negative.   Allergic/Immunologic: Negative.   Neurological: Negative.   Hematological: Negative.   Psychiatric/Behavioral: Negative.     Allergies  Levofloxacin  Home Medications   Prior to Admission medications   Medication Sig Start Date End Date Taking? Authorizing Provider  clotrimazole (GYNE-LOTRIMIN) 1 % vaginal cream Place 1 Applicatorful vaginally at bedtime. 09/05/16   Lysbeth Penner, FNP  ibuprofen (ADVIL,MOTRIN) 200 MG tablet Take 600 mg by mouth every 6 (six) hours as needed for mild pain.    Historical Provider, MD  metroNIDAZOLE (FLAGYL) 500 MG tablet Take 1 tablet (500 mg total) by mouth 2 (two) times daily. 09/05/16   Lysbeth Penner, FNP  oxyCODONE (OXY IR/ROXICODONE) 5 MG immediate release tablet Take 1-2 tablets (5-10 mg total) by mouth every 4 (four) hours as needed for moderate pain, severe pain or breakthrough pain. 03/27/16   Greer Pickerel, MD   Meds Ordered and Administered this Visit  Medications - No data to display  BP 140/94 (BP Location: Left Arm)   Pulse 78   Temp 97.9 F (36.6 C) (Oral)   Resp 20   LMP  08/19/2016 (Exact Date)   SpO2 99%   Breastfeeding? No  No data found.   Physical Exam  Constitutional: She is oriented to person, place, and time. She appears well-developed and well-nourished.  HENT:  Head: Normocephalic and atraumatic.  Eyes: Conjunctivae and EOM are normal. Pupils are equal, round, and reactive to light.  Neck: Normal range of motion. Neck supple.  Cardiovascular: Normal rate, regular rhythm and normal heart sounds.   Pulmonary/Chest: Effort normal and breath sounds normal.  Abdominal: Soft. Bowel sounds are normal.  Genitourinary: Vaginal discharge found.  Genitourinary Comments: BUS - Left labia with abrasion/irritation Vagina -  white vaginal DC Cervix - No CMT, No adnexal tenderness.  Musculoskeletal: Normal range of motion.  Neurological: She is alert and oriented to person, place, and time.  Nursing note and vitals reviewed.   Urgent Care Course     Procedures (including critical care time)  Labs Review Labs Reviewed  CERVICOVAGINAL ANCILLARY ONLY    Imaging Review Dg Sinuses Complete  Result Date: 09/05/2016 CLINICAL DATA:  Sinus congestion over the last 2 weeks. EXAM: PARANASAL SINUSES - COMPLETE 3 + VIEW COMPARISON:  CT 07/16/2013 FINDINGS: Frontal, ethmoid, maxillary and sphenoid sinuses appear clear. No regional bony abnormality. No radiopaque foreign object. IMPRESSION: Normal.  Artifact on the submental view, obviously. Electronically Signed   By: Nelson Chimes M.D.   On: 09/05/2016 11:44     Visual Acuity Review  Right Eye Distance:   Left Eye Distance:   Bilateral Distance:    Right Eye Near:   Left Eye Near:    Bilateral Near:         MDM   1. Sinus congestion   2. Vaginal discharge   3. Labial irritation    Atrovent Nasal Spray Lotrimin Vaginal cream one applicatior qhs [id number] grams Flagyl 500mg  one po bid x 7 days #14  PCP referral given      Lysbeth Penner, FNP 09/05/16 1225

## 2016-09-08 LAB — CERVICOVAGINAL ANCILLARY ONLY
Chlamydia: NEGATIVE
Neisseria Gonorrhea: NEGATIVE
Wet Prep (BD Affirm): POSITIVE — AB

## 2016-09-23 ENCOUNTER — Ambulatory Visit (INDEPENDENT_AMBULATORY_CARE_PROVIDER_SITE_OTHER): Payer: Medicaid Other | Admitting: Internal Medicine

## 2016-09-23 ENCOUNTER — Encounter: Payer: Self-pay | Admitting: Internal Medicine

## 2016-09-23 VITALS — BP 128/76 | HR 78 | Temp 98.4°F | Ht 64.0 in | Wt 185.0 lb

## 2016-09-23 DIAGNOSIS — J329 Chronic sinusitis, unspecified: Secondary | ICD-10-CM | POA: Diagnosis not present

## 2016-09-23 DIAGNOSIS — M79675 Pain in left toe(s): Secondary | ICD-10-CM | POA: Diagnosis not present

## 2016-09-23 DIAGNOSIS — J3489 Other specified disorders of nose and nasal sinuses: Secondary | ICD-10-CM | POA: Diagnosis not present

## 2016-09-23 DIAGNOSIS — M79674 Pain in right toe(s): Secondary | ICD-10-CM

## 2016-09-23 DIAGNOSIS — Z7689 Persons encountering health services in other specified circumstances: Secondary | ICD-10-CM | POA: Diagnosis not present

## 2016-09-23 DIAGNOSIS — G8929 Other chronic pain: Secondary | ICD-10-CM | POA: Diagnosis not present

## 2016-09-23 DIAGNOSIS — M25562 Pain in left knee: Secondary | ICD-10-CM | POA: Diagnosis not present

## 2016-09-23 DIAGNOSIS — M25561 Pain in right knee: Secondary | ICD-10-CM | POA: Diagnosis not present

## 2016-09-23 MED ORDER — AMOXICILLIN-POT CLAVULANATE 875-125 MG PO TABS: 1.0000 | ORAL_TABLET | Freq: Two times a day (BID) | ORAL | 0 refills | Status: DC

## 2016-09-23 NOTE — Progress Notes (Signed)
[facility] Progress Note  Subjective:  [name] is a 36 y.o. female with history of seasonal allergies, chronic sinus pain, gestational hypertension, and chronic knee pain who presents to establish care.   Concerns today include:  #Sinus pain -- ongoing for years. Now takes pseudofed daily, as she says over-the-counter allergy medications like claritin did not work. Has also tried atrovent nasal spray and afrin without improvement. Xray of sinuses 09/05/16 appeared clear. Thinks she has seen an allergist for this over 10 years ago but does not recall what she was told at that time. Thinks her mother recently had surgery for sinus issues. ROS: No fevers; does endorse rhinorrhea and headaches  #Knee pain -- Has had knee cracking for years but over the last year or so has had more sharp knee pains and soreness. This is worse with standing. Does not improve with NSAIDs. No known injuries. Has not fallen but often feels like legs are about to give out. Does not exercise regularly. Is concerned because a younger relative needed knee surgery.   #Toe pain -- Complains of painful thickened toenails. Has seen podiatry in the past and has had partial toenail removal. Thinks she has taken oral and topical antifungal in the past but does not think she continued treatment for very long.   PMH: Seasonal allergies (1990) -- takes zyrtec in the spring Gestational hypertension  Past Surgical History: Gallbladder removal (2002) C-section (2001, 2009, 2016) Tubal ligation (2016)  Family History: Father, grandparent, and p. aunt -- T2DM M. aunt died of breast cancer Father, p. grandmother, p. aunt -- HLD Father, p. grandmother, p. aunt -- HTN P. grandmother -- stroke  Social:  Lives with her three children -- aged 85 (son), 106 (daughter), and 1 (daughter) Owns a car Completed high school and works "kind of like a Quarry manager" at [employer] smoking tobacco January 2017. Does  smoke marijuana. Does not drink alcohol.   Health Maintenance: PNA vaccine 2016? Thought had pap smear this year but only had STD testing.   Objective: Blood pressure 128/76, pulse 78, temperature 98.4 F (36.9 C), temperature source Oral, height 5\' 4"  (1.626 m), weight 185 lb (83.9 kg), SpO2 97 %, not currently breastfeeding. Body mass index is 31.76 kg/m. Constitutional: Pleasant, mildly obese female in NAD HENT: Erythematous and swollen nasal turbinates, minimal light passes through sinuses with transillumination, tenderness to percussion over L maxillary sinus, evidence of fluid behind R TM, normal L TM, normal posterior oropharynx Cardiovascular: RRR, S1, S2, no m/r/g.  Pulmonary/Chest: Effort normal and breath sounds normal. No respiratory distress.  Abdominal: Soft. +BS, NT, ND Musculoskeletal: Crepitus over both knees bilaterally. Pain with bilaterally. Good knee stability with anterior and posterior drawer tests. Negative Thessaly's test. No increased patellar laxity. Popping over distal portion of IT band with bending L knee. Valgus stress with McMurray's somewhat uncomfortable bilaterally.  Neurological: AOx3, no focal deficits. Skin: Skin is warm and dry. No rash noted. Thickened toenails.  Psychiatric: Normal mood and affect.  Vitals reviewed  PHQ2: 0  Assessment/Plan: Sinus pain - Acute on chronic with symptoms ongoing > 2 weeks. - Will treat with augmentin, as patient reports relief from antibiotic treatment in the past  Knee pain, bilateral - Suspect OA given obesity and bilateral symptoms - Will obtain knee x-rays - Patient requests referral to Orthopedics - May benefit from introductory PT session (has Medicaid) pending x-ray results  Toe pain - Appears to have fairly significant onychomycosis  -  Placed referral to see Podiatry for toe pain and need for toenail trim  Follow-up in about 1 month to re-assess sinus pain and perform pap.  Olene Floss,  MD [place], PGY-2

## 2016-09-23 NOTE — Patient Instructions (Signed)
Ms. Bordas,  I prescribed augmentin to try for your sinus pain. Please return next month for pap smear and to see if your sinus pain has improved.  For your knees, I will refer to orthopedics and ordered x-rays. Please have x-rays done at your convenience.  I have placed a referral to podiatry -- you should get a call about this within the next week.  Best, Dr. Ola Spurr

## 2016-09-24 ENCOUNTER — Encounter: Payer: Self-pay | Admitting: Internal Medicine

## 2016-09-24 DIAGNOSIS — M25562 Pain in left knee: Secondary | ICD-10-CM

## 2016-09-24 DIAGNOSIS — M25561 Pain in right knee: Secondary | ICD-10-CM | POA: Insufficient documentation

## 2016-09-24 DIAGNOSIS — J3489 Other specified disorders of nose and nasal sinuses: Secondary | ICD-10-CM | POA: Insufficient documentation

## 2016-09-24 DIAGNOSIS — M79676 Pain in unspecified toe(s): Secondary | ICD-10-CM | POA: Insufficient documentation

## 2016-09-24 NOTE — Assessment & Plan Note (Addendum)
-   Suspect OA given obesity and bilateral symptoms - Will obtain knee x-rays - Patient requests referral to Orthopedics - May benefit from introductory PT session (has Medicaid) pending x-ray results

## 2016-09-24 NOTE — Assessment & Plan Note (Signed)
-   Acute on chronic with symptoms ongoing > 2 weeks. - Will treat with augmentin, as patient reports relief from antibiotic treatment in the past

## 2016-09-24 NOTE — Assessment & Plan Note (Signed)
-   Appears to have fairly significant onychomycosis  - Placed referral to see Podiatry for toe pain and need for toenail trim

## 2016-10-09 ENCOUNTER — Other Ambulatory Visit: Payer: Self-pay | Admitting: Internal Medicine

## 2016-10-09 ENCOUNTER — Telehealth: Payer: Self-pay | Admitting: Internal Medicine

## 2016-10-09 ENCOUNTER — Ambulatory Visit
Admission: RE | Admit: 2016-10-09 | Discharge: 2016-10-09 | Disposition: A | Discharge status: Home / Self Care | Payer: Medicaid Other | Source: Ambulatory Visit | Attending: Family Medicine | Admitting: Family Medicine

## 2016-10-09 DIAGNOSIS — G8929 Other chronic pain: Secondary | ICD-10-CM

## 2016-10-09 DIAGNOSIS — M25561 Pain in right knee: Principal | ICD-10-CM

## 2016-10-09 DIAGNOSIS — M25562 Pain in left knee: Principal | ICD-10-CM

## 2016-10-09 DIAGNOSIS — M6780 Other specified disorders of synovium and tendon, unspecified site: Secondary | ICD-10-CM

## 2016-10-09 MED ORDER — DICLOFENAC SODIUM 1 % TD GEL: TRANSDERMAL | 1 refills | Status: DC

## 2016-10-09 NOTE — Telephone Encounter (Signed)
Called patient with results of xrays of knees obtained for chronic pain. L knee film showed quadriceps tendinosis and spur of anterior patella. Recommended trying voltaren gel. Patient agrees to try. Also counseled on how weight loss could help, as knee experiences more burden than rest of body.

## 2016-10-14 ENCOUNTER — Ambulatory Visit (INDEPENDENT_AMBULATORY_CARE_PROVIDER_SITE_OTHER): Payer: Medicaid Other | Admitting: Podiatry

## 2016-10-14 DIAGNOSIS — L600 Ingrowing nail: Secondary | ICD-10-CM

## 2016-10-29 NOTE — Progress Notes (Signed)
Subjective:     Patient ID: [name], female   DOB: 1980/08/22, 36 y.o.   MRN: [id number]  HPI patient presents stating the nailbeds are bothering her bilateral and she states that she does have trouble at times wearing shoe gear   Review of Systems     Objective:   Physical Exam Neurovascular status intact muscle strength adequate patient's found have thickened hallux nails that are localized in nature with no other trauma noted    Assessment:     Relative damaged hallux nails long-term    Plan:     Reviewed case with patient at great length and do think ultimately nails will need to be removed but we'll continue to work with this conservatively

## 2016-11-07 ENCOUNTER — Ambulatory Visit (INDEPENDENT_AMBULATORY_CARE_PROVIDER_SITE_OTHER): Payer: Medicaid Other | Admitting: Family Medicine

## 2016-11-07 ENCOUNTER — Encounter: Payer: Self-pay | Admitting: Family Medicine

## 2016-11-07 VITALS — BP 134/99 | HR 76 | Temp 98.3°F | Ht 63.0 in | Wt 188.9 lb

## 2016-11-07 DIAGNOSIS — E559 Vitamin D deficiency, unspecified: Secondary | ICD-10-CM

## 2016-11-07 DIAGNOSIS — Z8639 Personal history of other endocrine, nutritional and metabolic disease: Secondary | ICD-10-CM

## 2016-11-07 NOTE — Patient Instructions (Addendum)
It was nice meeting you today! You were seen in clinic for feeling tired all the time.  This is most likely due to your Vitamin D deficiency and I have rechecked that today with bloodwork.  If your Vitamin D level is low, I will call it in to your pharmacy and it will be available for you to pick up.  In the meantime, we discussed other ways to get vitamin D and you can refer to the text below for additional information.  Please call with any questions. You can make an appointment to be seen in clinic next week if possible for follow up on additional issues that we were not able to address today.   Be well,  Lovenia Kim, MD     Vitamin D Deficiency Vitamin D deficiency is when your body does not have enough vitamin D. Vitamin D is important because:  It helps your body use other minerals that your body needs.  It helps keep your bones strong and healthy.  It may help to prevent some diseases.  It helps your heart and other muscles work well. You can get vitamin D by:  Eating foods with vitamin D in them.  Drinking or eating milk or other foods that have had vitamin D added to them.  Taking a vitamin D supplement.  Being in the sun. Not getting enough vitamin D can make your bones become soft. It can also cause other health problems. Follow these instructions at home:  Take medicines and supplements only as told by your doctor.  Eat foods that have vitamin D. These include:  Dairy products, cereals, or juices with added vitamin D. Check the label for vitamin D.  Fatty fish like salmon or trout.  Eggs.  Oysters.  Do not use tanning beds.  Stay at a healthy weight. Lose weight, if needed.  Keep all follow-up visits as told by your doctor. This is important. Contact a doctor if:  Your symptoms do not go away.  You feel sick to your stomach (nauseous).  Youthrow up (vomit).  You poop less often than usual or you have trouble pooping (constipation). This information  is not intended to replace advice given to you by your health care provider. Make sure you discuss any questions you have with your health care provider. Document Released: 06/19/2011 Document Revised: 12/06/2015 Document Reviewed: 11/15/2014 Elsevier Interactive Patient Education  2017 [employer].

## 2016-11-07 NOTE — Progress Notes (Signed)
   Subjective:   Patient ID: [name]    DOB: 1980-10-08, 36 y.o. female   MRN: [id number]  CC: feels tired all the time   HPI: [name] is a 36 y.o. female who presents to clinic today with complaint of feeling tired all the time.    Fatigue  States her Vitamin D level was low <10 about Oct 2015.  Was given samples at that time as well as a prescription.  Patient reports she did not take very many days of it and did not pick up the prescription.  Goes outside in the sun with kids a couple times a week.  Does not work indoors all day.  Does not have good dairy/milk intake because she feels she may be lactose intolerant.  Has been sleeping enough at night.   Also with history of anemia with pregnancy.  Hgb as low as 7.7 in 10/2014 however most recently stable at 12.0  in 03/2016.      ROS: Denies fevers, chills, nausea, vomiting, blood in stool.  Denies shortness of breath, palpitations.   [place]: Pertinent past medical, surgical, family, and social history were reviewed and updated as appropriate. Smoking status reviewed.   Medications reviewed.  Objective:   BP (!) 134/99   Pulse 76   Temp 98.3 F (36.8 C) (Oral)   Ht 5\' 3"  (1.6 m)   Wt 188 lb 14.2 oz (85.7 kg)   SpO2 99%   BMI 33.46 kg/m  Vitals and nursing note reviewed.  General: 36 yo female, well nourished, NAD  HEENT: NCAT, MMM, conjunctival pallor  Neck: supple CV: RRR no MRG  Lungs: CTA B/L with WOB  Abdomen: soft, NTND, +bs  Skin: warm, dry   Assessment & Plan:   Vitamin D deficiency Suspect fatigue is due to low Vitamin D level.  Previous level < 10 when checked in 2015 and patient reports she did not continue taking supplementation past a few days  -Reviewed list of foods with high Vitamin D content  -Check Vit D level today -If remains low, will call in to her pharmacy 50,000 IU x 8-12 weeks and advise her to take them as directed   Orders Placed This Encounter  Procedures  . VITAMIN D 25 Hydroxy (Vit-D  Deficiency, Fractures)   Follow up: 3-4 weeks   Lovenia Kim, MD [place], PGY-1 11/13/2016 10:36 PM

## 2016-11-08 LAB — VITAMIN D 25 HYDROXY (VIT D DEFICIENCY, FRACTURES): Vit D, 25-Hydroxy: 7.3 ng/mL — ABNORMAL LOW (ref 30.0–100.0)

## 2016-11-13 ENCOUNTER — Other Ambulatory Visit: Payer: Self-pay | Admitting: Family Medicine

## 2016-11-13 DIAGNOSIS — E559 Vitamin D deficiency, unspecified: Secondary | ICD-10-CM | POA: Insufficient documentation

## 2016-11-13 HISTORY — DX: Vitamin D deficiency, unspecified: E55.9

## 2016-11-13 MED ORDER — VITAMIN D (ERGOCALCIFEROL) 1.25 MG (50000 UNIT) PO CAPS: 50000.0000 [IU] | ORAL_CAPSULE | ORAL | 0 refills | Status: DC

## 2016-11-13 NOTE — Assessment & Plan Note (Addendum)
Suspect fatigue is due to low Vitamin D level.  Previous level < 10 when checked in 2015 and patient reports she did not continue taking supplementation past a few days  -Reviewed list of foods with high Vitamin D content  -Check Vit D level today -If remains low, will call in to her pharmacy 50,000 IU x 8-12 weeks and advise her to take them as directed

## 2016-11-17 ENCOUNTER — Telehealth: Payer: Self-pay | Admitting: Family Medicine

## 2016-11-17 NOTE — Telephone Encounter (Signed)
Called patient twice to discuss her low vitamin D level of 7.3 however no answer.  I have left a VM informing her that  I have called in Vitamin D supplementation to her pharmacy .  She is to pick this up and take 1 capsule weekly for 12 weeks.

## 2016-12-11 ENCOUNTER — Ambulatory Visit (INDEPENDENT_AMBULATORY_CARE_PROVIDER_SITE_OTHER): Payer: Medicaid Other | Admitting: Internal Medicine

## 2016-12-11 ENCOUNTER — Other Ambulatory Visit (HOSPITAL_COMMUNITY)
Admission: RE | Admit: 2016-12-11 | Discharge: 2016-12-11 | Disposition: A | Discharge status: Home / Self Care | Payer: Medicaid Other | Source: Ambulatory Visit | Attending: Family Medicine | Admitting: Family Medicine

## 2016-12-11 ENCOUNTER — Encounter: Payer: Self-pay | Admitting: Internal Medicine

## 2016-12-11 VITALS — BP 125/75 | HR 94 | Temp 99.5°F | Ht 63.0 in | Wt 181.0 lb

## 2016-12-11 DIAGNOSIS — N920 Excessive and frequent menstruation with regular cycle: Secondary | ICD-10-CM | POA: Diagnosis not present

## 2016-12-11 DIAGNOSIS — D229 Melanocytic nevi, unspecified: Secondary | ICD-10-CM | POA: Diagnosis not present

## 2016-12-11 DIAGNOSIS — B9689 Other specified bacterial agents as the cause of diseases classified elsewhere: Secondary | ICD-10-CM | POA: Diagnosis not present

## 2016-12-11 DIAGNOSIS — Z0000 Encounter for general adult medical examination without abnormal findings: Secondary | ICD-10-CM | POA: Insufficient documentation

## 2016-12-11 DIAGNOSIS — E559 Vitamin D deficiency, unspecified: Secondary | ICD-10-CM | POA: Diagnosis not present

## 2016-12-11 DIAGNOSIS — Z113 Encounter for screening for infections with a predominantly sexual mode of transmission: Secondary | ICD-10-CM

## 2016-12-11 DIAGNOSIS — Z124 Encounter for screening for malignant neoplasm of cervix: Secondary | ICD-10-CM | POA: Diagnosis not present

## 2016-12-11 DIAGNOSIS — A5901 Trichomonal vulvovaginitis: Secondary | ICD-10-CM

## 2016-12-11 DIAGNOSIS — N760 Acute vaginitis: Secondary | ICD-10-CM | POA: Diagnosis not present

## 2016-12-11 HISTORY — DX: Melanocytic nevi, unspecified: D22.9

## 2016-12-11 HISTORY — DX: Encounter for screening for infections with a predominantly sexual mode of transmission: Z11.3

## 2016-12-11 HISTORY — DX: Encounter for general adult medical examination without abnormal findings: Z00.00

## 2016-12-11 LAB — POCT WET PREP (WET MOUNT): Clue Cells Wet Prep Whiff POC: POSITIVE

## 2016-12-11 MED ORDER — NORGESTIMATE-ETH ESTRADIOL 0.25-35 MG-MCG PO TABS: 1.0000 | ORAL_TABLET | Freq: Every day | ORAL | 11 refills | Status: DC

## 2016-12-11 MED ORDER — METRONIDAZOLE 500 MG PO TABS: 500.0000 mg | ORAL_TABLET | Freq: Two times a day (BID) | ORAL | 0 refills | Status: DC

## 2016-12-11 NOTE — Progress Notes (Signed)
[facility] Progress Note  Subjective:  [name] is a 36 y.o. with history of vitamin D deficiency and chronic sinus pain who presents for STD check. Also due for pap smear and with complaint of ongoing mennorhagia.   #STD check - Patient has 1 female partner, who is new. She says she recently had unprotected sex and "does not trust" new partner. - No increased vaginal discharge - Has had tubes tied so not concerned for pregnancy - Last pap smear "normal" in 2006 - Denies having an STD before  ROS: No nausea, vomiting, fever, abdominal pain, dysuria, dyspareunia  #Mennorhagia - Ongoing since she had tubal ligation about 2 years ago - Periods now heavier. Has to use 2 pads at a time at their heaviest.  - Is convinced it was caused by tubal ligation and would like to have reversed (as would also consider having another child) but cost is prohibitive - Says tried multiple forms of birth control in the past but got pregnant on them (nuvaring, nexplanon, OCPs) - Does not think she has tried OCPs since periods have become heavier - No family history of blood clots. No personal history of migraines. Quit smoking 1 year ago.   #Mole on Private Part - Patient has skin tag on mons pubis, which bothers her due to appearance - Tired of explaining its not an STD - Does not hurt - Interested in removal; has considered tying a string around it  Social: Former smoker  Objective: Blood pressure 125/75, pulse 94, temperature 99.5 F (37.5 C), temperature source Oral, height 5\' 3"  (1.6 m), weight 181 lb (82.1 kg), SpO2 98 %. Body mass index is 32.06 kg/m. Constitutional: Obese female, in NAD Abdominal: Soft. +BS, NT, ND Musculoskeletal: No LE edema Skin: < 1 cm flesh-colored skin tag of mons pubis. Raised flesh-colored mole <1 cm of L buttock.  GU: Minimal white discharge on speculum exam. No cervical motion tenderness on bimanual exam. Chaperone present.  Psychiatric: Normal  mood and affect.  Vitals reviewed  POCT Wet Prep [facility] [place])     Status: Abnormal   Collection Time: 12/11/16  4:53 PM  Result Value Ref Range   Source Wet Prep POC VAG    WBC, Wet Prep HPF POC 1-5    Bacteria Wet Prep HPF POC Many (A) Few   Clue Cells Wet Prep HPF POC Few (A) None   Clue Cells Wet Prep Whiff POC Positive Whiff    Yeast Wet Prep HPF POC None    Trichomonas Wet Prep HPF POC Present (A) Absent   Hgb 03/2016: 12.0  Assessment/Plan: Screening examination for STD (sexually transmitted disease) - Not currently symptomatic - Ordered wet prep, gc/chlamydia with pap smear, HIV and RPR - Wet prep positive for BV and trichomonas - Called patient with results (gave permission to leave VM). Informed her of above results and ordered flagyl 500 mg BID x 7 days. Recommended test of cure within 3 months and testing of partners.  Menorrhagia - Ongoing for last 2 years. - Counseled patient on hormonal options that may reduce flow. - Ordered OCPs (sprintec) - Patient interested in [place] IUD in future (after tubal reversal and another child)  Skin mole - Placed referral to Dermatology for removal of skin tag on mons pubis, as is bothersome to patient  Follow-up in about 3 months for test of cure of trichomonas.  Olene Floss, MD [place], PGY-2

## 2016-12-11 NOTE — Assessment & Plan Note (Signed)
-   Not currently symptomatic - Ordered wet prep, gc/chlamydia with pap smear, HIV and RPR - Wet prep positive for BV and trichomonas - Called patient with results (gave permission to leave VM). Informed her of above results and ordered flagyl 500 mg BID x 7 days. Recommended test of cure within 3 months and testing of partners.

## 2016-12-11 NOTE — Patient Instructions (Signed)
Ms. Knouff,  I will call you with the rest of your results next week.  Try the birth control to help with periods.   Best, Dr. Ola Spurr

## 2016-12-11 NOTE — Assessment & Plan Note (Signed)
-   Placed referral to Dermatology for removal of skin tag on mons pubis, as is bothersome to patient

## 2016-12-11 NOTE — Assessment & Plan Note (Signed)
-   Ongoing for last 2 years. - Counseled patient on hormonal options that may reduce flow. - Ordered OCPs (sprintec) - Patient interested in [place] IUD in future (after tubal reversal and another child)

## 2016-12-12 LAB — SYPHILIS: RPR W/REFLEX TO RPR TITER AND TREPONEMAL ANTIBODIES, TRADITIONAL SCREENING AND DIAGNOSIS ALGORITHM: RPR Ser Ql: NONREACTIVE

## 2016-12-12 LAB — HIV ANTIBODY (ROUTINE TESTING W REFLEX): HIV SCREEN 4TH GENERATION: NONREACTIVE

## 2016-12-15 LAB — CERVICOVAGINAL ANCILLARY ONLY
CHLAMYDIA, DNA PROBE: NEGATIVE
NEISSERIA GONORRHEA: NEGATIVE

## 2016-12-16 LAB — CYTOLOGY - PAP
Adequacy: ABSENT
DIAGNOSIS: NEGATIVE
HPV: NOT DETECTED

## 2017-02-12 IMAGING — MR MR ABDOMEN WO/W CM MRCP
18 of 20 series · 43 of 48 positions shown · IV contrast (multihance)
Comparison: CT abdomen pelvis 01/24/2016.

CLINICAL DATA: Patient with right-sided abdominal pain for 1 month.
Recent CT demonstrated multiple low-attenuation masses in the liver,
favored represent hemangiomas. For further evaluation.

EXAM:
MRI ABDOMEN WITHOUT AND WITH CONTRAST (INCLUDING MRCP)
TECHNIQUE: Multiplanar multisequence MR imaging of the abdomen was performed
both before and after the administration of intravenous contrast.
Heavily T2-weighted images of the biliary and pancreatic ducts were
obtained, and three-dimensional MRCP images were rendered by post
processing.
CONTRAST:  16mL MULTIHANCE GADOBENATE DIMEGLUMINE 529 MG/ML IV SOLN

[Series 3: T2 · coronal · 5.0mm · 1.56mm/px · 1 of 36 slices shown (1 of 4)]
[im 1/36]
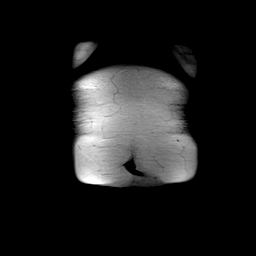

[Series 5: T2 · axial · 5.0mm · 1.48mm/px · 1 of 41 slices shown (2 of 4)]
[im 1/41]
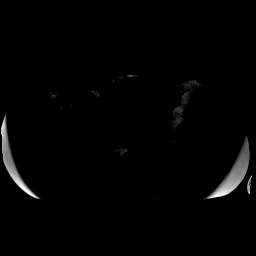

[Series 6: T1 · axial · 3.0mm · 1.19mm/px · z∈[-61,+176]mm · 5 of 160 slices shown]
[im 1/160]
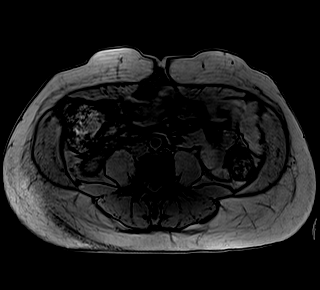
[im 40/160]
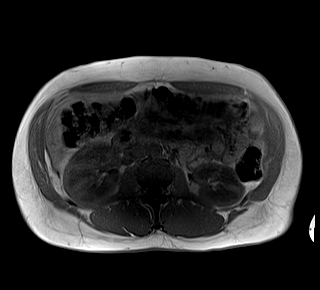
[im 80/160]
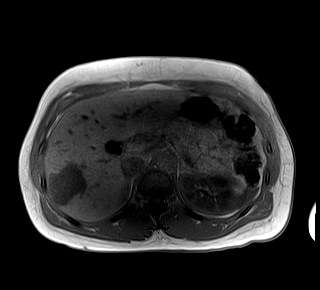
[im 120/160]
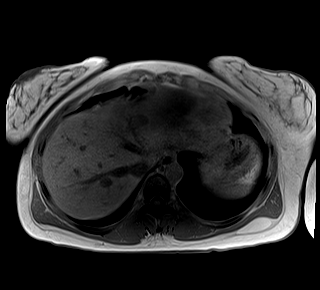
[im 160/160]
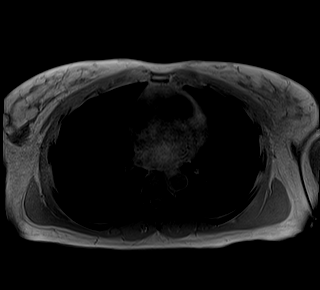

[Series 7: DWI · axial · 5.0mm · 1.42mm/px · z∈[-41,+193]mm · 4 of 120 slices shown (1 of 2)]
[im 1/120]
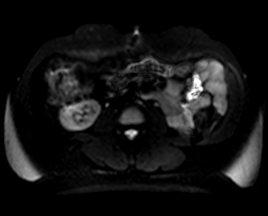
[im 40/120]
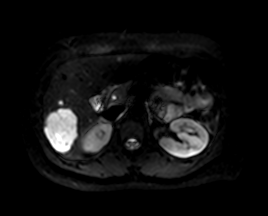
[im 80/120]
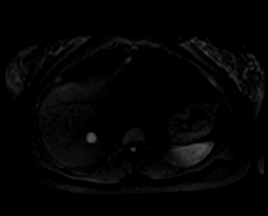
[im 120/120]
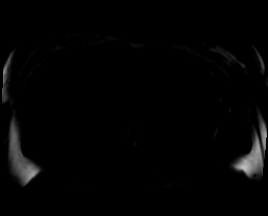

[Series 8: DWI · axial · 5.0mm · 1.42mm/px · 1 of 40 slices shown (2 of 2)]
[im 1/40]
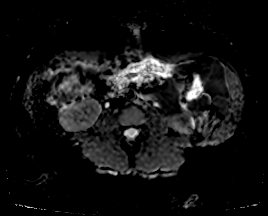

[Series 9: T2 · axial · 6.0mm · 1.22mm/px · 1 of 34 slices shown (3 of 4)]
[im 1/34]
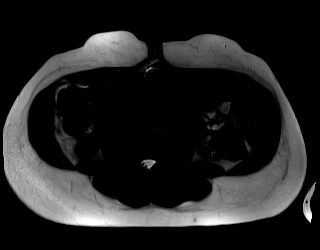

[Series 12: T2 · coronal · 3.0mm · 1.19mm/px · 1 of 23 slices shown (4 of 4)]
[im 1/23]
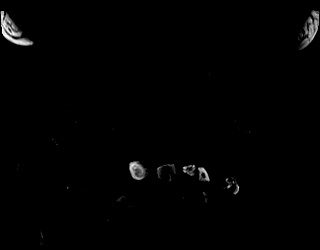

[Series 14: MRCP · coronal · 1.0mm · 0.49mm/px · 2 of 64 slices shown]
[im 1/64]
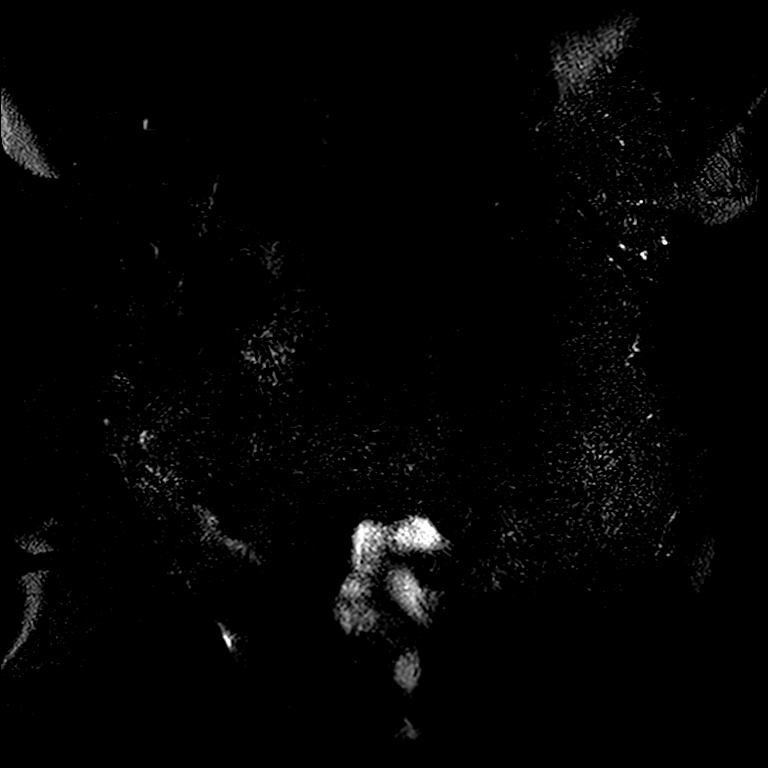
[im 64/64]
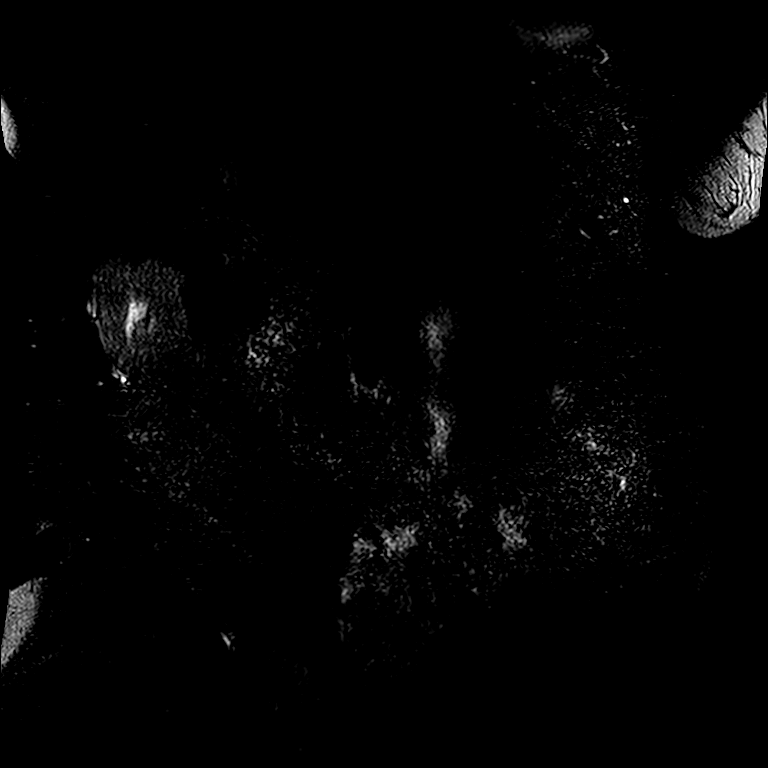

[Series 16: bSSFP · axial · 5.0mm · 1.25mm/px · 1 of 38 slices shown]
[im 1/38]
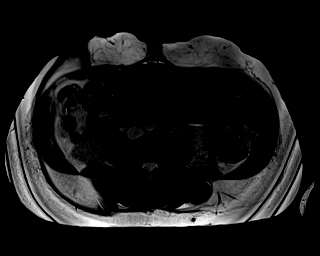

[Series 17: T1 dynamic · axial · non-contrast · 3.0mm · 1.25mm/px · z∈[-51,+186]mm · 3 of 80 slices shown]
[im 1/80]
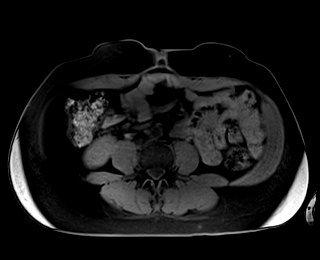
[im 40/80]
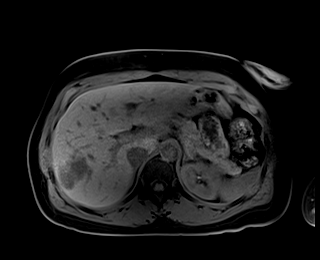
[im 80/80]
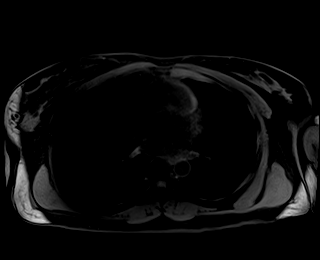

[Series 18: T1 dynamic post-contrast · axial · 3.0mm · 1.25mm/px · z∈[-51,+186]mm · 3 of 80 slices shown (1 of 8)]
[im 1/80]
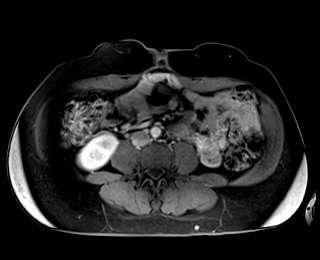
[im 40/80]
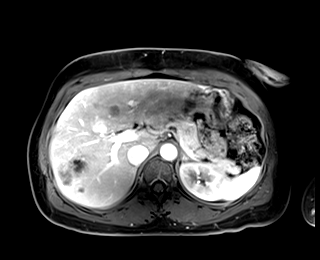
[im 80/80]
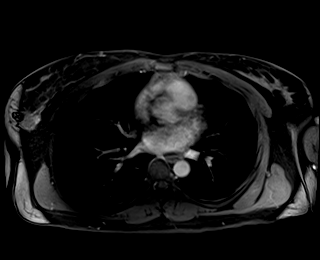

[Series 19: T1 dynamic post-contrast · axial · 3.0mm · 1.25mm/px · z∈[-51,+186]mm · 3 of 80 slices shown (2 of 8)]
[im 1/80]
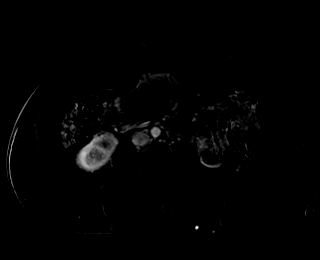
[im 40/80]
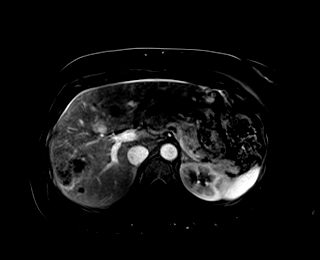
[im 80/80]
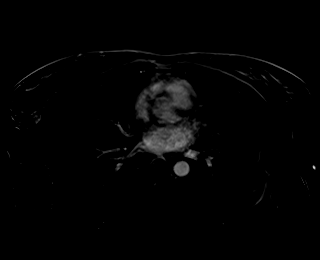

[Series 20: T1 dynamic post-contrast · axial · 3.0mm · 1.25mm/px · z∈[-51,+186]mm · 3 of 80 slices shown (3 of 8)]
[im 1/80]
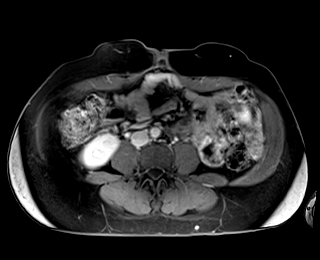
[im 40/80]
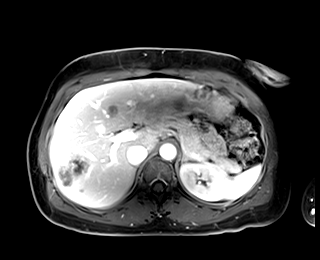
[im 80/80]
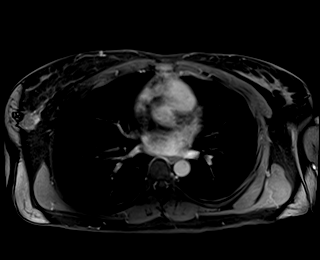

[Series 21: T1 dynamic post-contrast · axial · 3.0mm · 1.25mm/px · z∈[-51,+186]mm · 3 of 80 slices shown (4 of 8)]
[im 1/80]
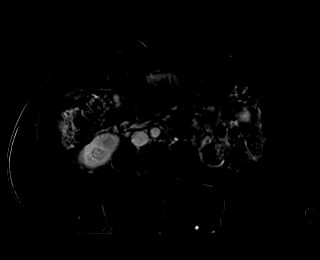
[im 40/80]
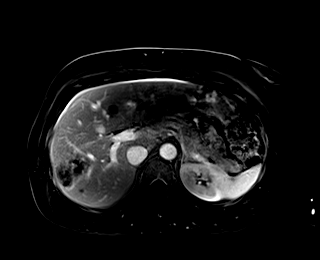
[im 80/80]
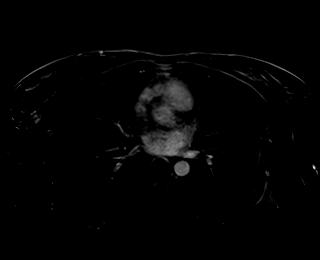

[Series 22: T1 dynamic post-contrast · axial · 3.0mm · 1.25mm/px · z∈[-51,+186]mm · 3 of 80 slices shown (5 of 8)]
[im 1/80]
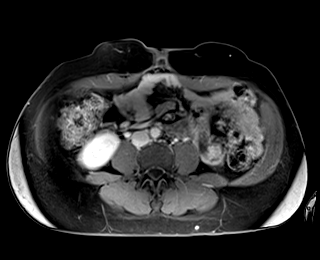
[im 40/80]
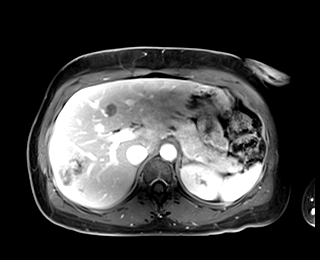
[im 80/80]
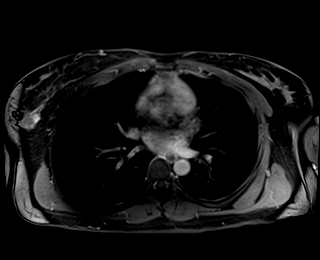

[Series 23: T1 dynamic post-contrast · axial · 3.0mm · 1.25mm/px · z∈[-51,+186]mm · 3 of 80 slices shown (6 of 8)]
[im 1/80]
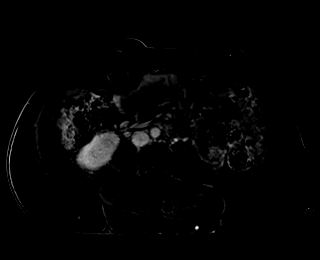
[im 40/80]
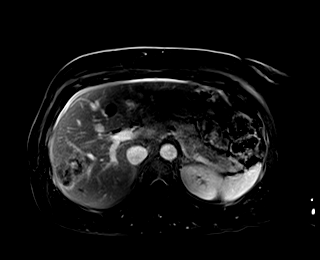
[im 80/80]
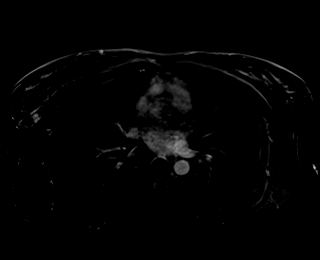

[Series 24: T1 dynamic post-contrast · coronal · 3.0mm · 1.25mm/px · 3 of 72 slices shown (7 of 8)]
[im 1/72]
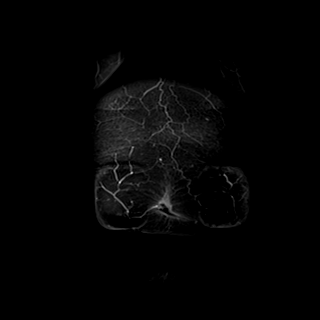
[im 36/72]
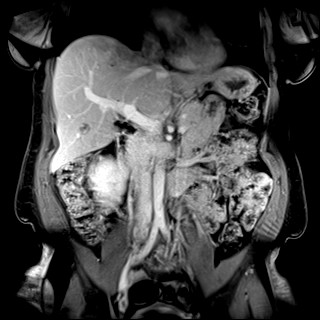
[im 72/72]
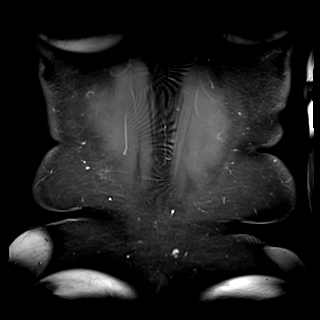

[Series 25: T1 dynamic post-contrast · axial · 3.0mm · 1.25mm/px · z∈[-51,+66]mm · 2 of 80 slices shown (8 of 8)]
[im 1/80]
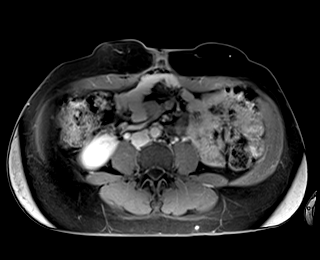
[im 40/80]
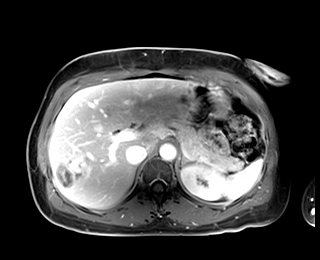

[43 of 48 positions shown; findings below may reference images not displayed]

FINDINGS: Lower chest:  Unremarkable

Hepatobiliary: Liver is normal in size and contour. There are
multiple masses throughout the right and left hepatic lobe which are
similar in appearance with increased T2 signal and peripheral
nodular enhancement, most compatible with hepatic hemangiomas. The
largest of these masses is located within the right hepatic lobe and
measures 7.1 x 4.6 cm. Reference mass within hepatic dome measures 9
mm (image 20; series 18). Reference mass within the right hepatic
lobe measures 16 mm (image 27; series 18). Gallbladder is surgically
absent. No intrahepatic or extrahepatic biliary ductal dilatation.

Pancreas: Findings suggestive of pancreatic divisum (image 23;
series 9). No pancreatic masses or acute inflammatory changes.

Spleen: Unremarkable

Adrenals/Urinary Tract: Normal adrenal glands. Kidneys enhance
symmetrically with contrast. No hydronephrosis.

Stomach/Bowel: Normal morphology of the stomach. No abnormal bowel
wall thickening or evidence for bowel obstruction.

Vascular/Lymphatic: Normal caliber abdominal aorta. No
retroperitoneal lymphadenopathy.

Other: None.

Musculoskeletal: Normal osseous marrow signal. Diastases of the
rectus abdominus musculature. There is a fat containing ventral
abdominal wall hernia (image 72; series 20).
IMPRESSION: Multiple hepatic masses with signal characteristics compatible with
hepatic hemangiomas, largest of which measures up to 7.1 cm within
the right hepatic lobe.

Small fat containing ventral abdominal wall hernia.

Findings suggestive of pancreatic divisum.

## 2019-08-07 ENCOUNTER — Ambulatory Visit (HOSPITAL_COMMUNITY)
Admission: EM | Admit: 2019-08-07 | Discharge: 2019-08-07 | Disposition: A | Discharge status: Home / Self Care | Payer: Self-pay | Attending: Family Medicine | Admitting: Family Medicine

## 2019-08-07 ENCOUNTER — Other Ambulatory Visit: Payer: Self-pay

## 2019-08-07 ENCOUNTER — Encounter (HOSPITAL_COMMUNITY): Payer: Self-pay

## 2019-08-07 ENCOUNTER — Telehealth (HOSPITAL_COMMUNITY): Payer: Self-pay

## 2019-08-07 DIAGNOSIS — A5901 Trichomonal vulvovaginitis: Secondary | ICD-10-CM

## 2019-08-07 DIAGNOSIS — B9689 Other specified bacterial agents as the cause of diseases classified elsewhere: Secondary | ICD-10-CM | POA: Insufficient documentation

## 2019-08-07 DIAGNOSIS — M25522 Pain in left elbow: Secondary | ICD-10-CM | POA: Diagnosis present

## 2019-08-07 DIAGNOSIS — N760 Acute vaginitis: Secondary | ICD-10-CM | POA: Insufficient documentation

## 2019-08-07 HISTORY — DX: Pain in left elbow: M25.522

## 2019-08-07 MED ORDER — METRONIDAZOLE 500 MG PO TABS: 500.0000 mg | ORAL_TABLET | Freq: Two times a day (BID) | ORAL | 0 refills | Status: DC

## 2019-08-07 MED ORDER — PREDNISONE 10 MG PO TABS: 20.0000 mg | ORAL_TABLET | Freq: Every day | ORAL | 0 refills | Status: DC

## 2019-08-07 MED ORDER — FLUCONAZOLE 200 MG PO TABS: 200.0000 mg | ORAL_TABLET | Freq: Every day | ORAL | 0 refills | Status: AC

## 2019-08-07 NOTE — ED Provider Notes (Signed)
[place]    CSN: [id number] Arrival date & time: 08/07/19  1300      History   Chief Complaint Chief Complaint  Patient presents with  . Elbow Pain  . Vaginitis    HPI DELVINA WEAKS is a 39 y.o. female.   She is complaining of pain in her left upper arm and elbow.  She does not do any repetitive activities with the arm.  She says it looks swollen.  Pain increases with pronation supination.  There is tenderness over both medial and lateral epicondyles.  HPI  Past Medical History:  Diagnosis Date  . Allergy   . Anxiety   . Cough 03/21/2016   no fever, states coughing up "snot colored cold stuff"-  has had this x 1.5 weeks and is getting much better- taking tylenol cold  . Depression    never been treated , "probably got some "  . Headache(784.0)   . Tooth ache    right upper molar partially broke off    Patient Active Problem List   Diagnosis Date Noted  . Arthralgia of left upper arm 08/07/2019  . Screening examination for STD (sexually transmitted disease) 12/11/2016  . Menorrhagia 12/11/2016  . Skin mole 12/11/2016  . Vitamin D deficiency 11/13/2016  . Sinus pain 09/24/2016  . Knee pain, bilateral 09/24/2016  . Toe pain 09/24/2016  . S/P cesarean section 11/03/2014  . Poor weight gain of pregnancy   . Insomnia 06/02/2014  . GERD without esophagitis 05/16/2014  . ALLERGIC RHINITIS 03/19/2007    Past Surgical History:  Procedure Laterality Date  . CESAREAN SECTION    . CESAREAN SECTION WITH BILATERAL TUBAL LIGATION Bilateral 11/03/2014   Procedure: CESAREAN SECTION WITH BILATERAL TUBAL LIGATION;  Surgeon: Shelly Bombard, MD;  Location: [facility];  Service: Obstetrics;  Laterality: Bilateral;  . CHOLECYSTECTOMY    . CHOLECYSTECTOMY, LAPAROSCOPIC    . DILATION AND CURETTAGE OF UTERUS    . INCISIONAL HERNIA REPAIR N/A 03/27/2016   Procedure: LAPAROSCOPIC ASSISTED REPAIR OF INCARCERATED  INCISIONAL HERNIA;  Surgeon: Greer Pickerel, MD;  Location: [facility];   Service: General;  Laterality: N/A;  . INSERTION OF MESH N/A 03/27/2016   Procedure: INSERTION OF MESH;  Surgeon: Greer Pickerel, MD;  Location: [facility];  Service: General;  Laterality: N/A;  . THERAPEUTIC ABORTION    . TUBAL LIGATION      OB History    Gravida  8   Para  3   Term  3   Preterm  0   AB  4   Living  3     SAB  1   TAB  3   Ectopic      Multiple  0   Live Births  2            Home Medications    Prior to Admission medications   Medication Sig Start Date End Date Taking? Authorizing Provider  amoxicillin-clavulanate (AUGMENTIN) 875-125 MG tablet Take 1 tablet by mouth 2 (two) times daily. Patient not taking: Reported on 10/14/2016 09/23/16   Rogue Bussing, MD  diclofenac sodium (VOLTAREN) 1 % GEL Apply up to 4 times daily to knee for pain. 10/09/16   Rogue Bussing, MD  fluconazole (DIFLUCAN) 200 MG tablet Take 1 tablet (200 mg total) by mouth daily for 2 days. 08/07/19 08/09/19  Wardell Honour, MD  ibuprofen (ADVIL,MOTRIN) 200 MG tablet Take 600 mg by mouth every 6 (six) hours as needed  for mild pain.    [provider]  ipratropium (ATROVENT) 0.06 % nasal spray Place 2 sprays into both nostrils 4 (four) times daily. Patient not taking: Reported on 10/14/2016 09/05/16   Lysbeth Penner, FNP  metroNIDAZOLE (FLAGYL) 500 MG tablet Take 1 tablet (500 mg total) by mouth 2 (two) times daily. 08/07/19   Wardell Honour, MD  norgestimate-ethinyl estradiol (ORTHO-CYCLEN,SPRINTEC,PREVIFEM) 0.25-35 MG-MCG tablet Take 1 tablet by mouth daily. 12/11/16   Rogue Bussing, MD  predniSONE (DELTASONE) 10 MG tablet Take 2 tablets (20 mg total) by mouth daily. 08/07/19   Wardell Honour, MD  pseudoephedrine (SUDAFED) 30 MG tablet Take 30 mg by mouth every 4 (four) hours as needed for congestion.    [provider]  Vitamin D, Ergocalciferol, (DRISDOL) 50000 units CAPS capsule Take 1 capsule (50,000 Units total) by mouth every 7  (seven) days. 11/13/16   Lovenia Kim, MD    Family History Family History  Problem Relation Age of Onset  . Diabetes Father   . Hypertension Father   . Hyperlipidemia Father   . Cancer Maternal Aunt        breast  . Hypertension Paternal Grandmother   . Stroke Paternal Grandmother     Social History Social History   Tobacco Use  . Smoking status: Former Smoker    Types: Cigarettes    Quit date: 07/15/2015    Years since quitting: 4.0  . Smokeless tobacco: Never Used  . Tobacco comment: prior to preg  Substance Use Topics  . Alcohol use: No    Alcohol/week: 0.0 standard drinks  . Drug use: Yes    Types: Marijuana     Allergies   Levofloxacin   Review of Systems Review of Systems  Musculoskeletal: Positive for arthralgias and neck pain.  All other systems reviewed and are negative.    Physical Exam Triage Vital Signs ED Triage Vitals [08/07/19 1321]  Enc Vitals Group     BP 129/89     Pulse Rate 94     Resp 18     Temp 98.8 F (37.1 C)     Temp Source Oral     SpO2 99 %     Weight      Height      Head Circumference      Peak Flow      Pain Score 5     Pain Loc      Pain Edu?      Excl. in [place]?    No data found.  Updated Vital Signs BP 129/89 (BP Location: Left Arm)   Pulse 94   Temp 98.8 F (37.1 C) (Oral)   Resp 18   LMP 07/08/2019   SpO2 99%   Visual Acuity Right Eye Distance:   Left Eye Distance:   Bilateral Distance:    Right Eye Near:   Left Eye Near:    Bilateral Near:     Physical Exam Constitutional:      Appearance: Normal appearance. She is obese.  Genitourinary:    Comments: Self collected vaginal specimen submitted Musculoskeletal:     Comments: Neck: Normal range of motion No tenderness over C-spine Tenderness over medial and lateral epicondyles and pain increases with pronation supination against resistance  Neurological:     Mental Status: She is alert.      UC Treatments / Results  Labs (all labs ordered  are listed, but only abnormal results are displayed) Labs Reviewed  CERVICOVAGINAL ANCILLARY  ONLY    EKG   Radiology No results found.  Procedures Procedures (including critical care time)  Medications Ordered in UC Medications - No data to display  Initial Impression / Assessment and Plan / UC Course  I have reviewed the triage vital signs and the nursing notes.  Pertinent labs & imaging results that were available during my care of the patient were reviewed by me and considered in my medical decision making (see chart for details).     Left elbow and upper arm pain.  Consider epicondylitis and disc disease with radiculopathy Final Clinical Impressions(s) / UC Diagnoses   Final diagnoses:  Vaginitis and vulvovaginitis  Arthralgia of left upper arm   Discharge Instructions   None    ED Prescriptions    Medication Sig Dispense Auth. Provider   metroNIDAZOLE (FLAGYL) 500 MG tablet Take 1 tablet (500 mg total) by mouth 2 (two) times daily. 14 tablet Wardell Honour, MD   predniSONE (DELTASONE) 10 MG tablet Take 2 tablets (20 mg total) by mouth daily. 15 tablet Wardell Honour, MD   fluconazole (DIFLUCAN) 200 MG tablet Take 1 tablet (200 mg total) by mouth daily for 2 days. 7 tablet Wardell Honour, MD     PDMP not reviewed this encounter.   Wardell Honour, MD 08/07/19 [phone]

## 2019-08-07 NOTE — ED Triage Notes (Signed)
Pt presents with left elbow pain & swelling not associated with any injury X 2 days.  Pt also presents with vaginal irritation and discharge X 1 week.

## 2019-08-10 LAB — CERVICOVAGINAL ANCILLARY ONLY
Bacterial vaginitis: POSITIVE — AB
Candida vaginitis: POSITIVE — AB
Chlamydia: NEGATIVE
Neisseria Gonorrhea: NEGATIVE
Trichomonas: NEGATIVE

## 2019-12-30 ENCOUNTER — Telehealth (HOSPITAL_COMMUNITY): Payer: Self-pay

## 2019-12-30 ENCOUNTER — Other Ambulatory Visit: Payer: Self-pay

## 2019-12-30 ENCOUNTER — Encounter (HOSPITAL_COMMUNITY): Payer: Self-pay

## 2019-12-30 ENCOUNTER — Ambulatory Visit (HOSPITAL_COMMUNITY)
Admission: EM | Admit: 2019-12-30 | Discharge: 2019-12-30 | Disposition: A | Discharge status: Home / Self Care | Payer: Self-pay | Attending: Family Medicine | Admitting: Family Medicine

## 2019-12-30 DIAGNOSIS — B9689 Other specified bacterial agents as the cause of diseases classified elsewhere: Secondary | ICD-10-CM

## 2019-12-30 DIAGNOSIS — M722 Plantar fascial fibromatosis: Secondary | ICD-10-CM

## 2019-12-30 DIAGNOSIS — N898 Other specified noninflammatory disorders of vagina: Secondary | ICD-10-CM

## 2019-12-30 DIAGNOSIS — A5901 Trichomonal vulvovaginitis: Secondary | ICD-10-CM

## 2019-12-30 DIAGNOSIS — N760 Acute vaginitis: Secondary | ICD-10-CM

## 2019-12-30 MED ORDER — FLUCONAZOLE 150 MG PO TABS: 150.0000 mg | ORAL_TABLET | Freq: Every day | ORAL | 0 refills | Status: DC

## 2019-12-30 MED ORDER — PREDNISONE 10 MG (21) PO TBPK: ORAL_TABLET | ORAL | 0 refills | Status: DC

## 2019-12-30 MED ORDER — METRONIDAZOLE 500 MG PO TABS: 500.0000 mg | ORAL_TABLET | Freq: Two times a day (BID) | ORAL | 0 refills | Status: DC

## 2019-12-30 NOTE — Discharge Instructions (Addendum)
Treating you for BV and yeast. Take medication as prescribed. Prednisone for plantar fasciitis Information given on plantar fasciitis Recommended insoles for shoes and wear comfortable supportive shoes.

## 2019-12-30 NOTE — ED Provider Notes (Signed)
[place]    CSN: [id number] Arrival date & time: 12/30/19  1218      History   Chief Complaint Chief Complaint  Patient presents with   Vaginitis   Foot Pain    HPI [name] is a 39 y.o. female.   Patient is a 39 year old female the presents today with multiple complaints.  1st complaint being white, vaginal discharge with odor.  She has some mild itching and irritation of the vaginal area.  Symptoms consistent with previous BV and yeast infection.  She denies any associated abdominal pain, back pain, dysuria, hematuria or urinary frequency. Patient's last menstrual period was 12/05/2019.  She is also had chronic foot pain that has been worsening.  The pain is located to the bottom of both feet.  Worse when getting up out of bed in the morning.  Now the pain is persistent throughout the day.  Does not do a lot of walking or standing at work.  Mild generalized swelling at times resolved with elevating.  No injuries to the feet.  Describes the pain is achy.  No numbness, tingling.  ROS per HPI      Past Medical History:  Diagnosis Date   Allergy    Anxiety    Cough 03/21/2016   no fever, states coughing up "snot colored cold stuff"-  has had this x 1.5 weeks and is getting much better- taking tylenol cold   Depression    never been treated , "probably got some "   Headache(784.0)    Tooth ache    right upper molar partially broke off    Patient Active Problem List   Diagnosis Date Noted   Arthralgia of left upper arm 08/07/2019   Screening examination for STD (sexually transmitted disease) 12/11/2016   Menorrhagia 12/11/2016   Skin mole 12/11/2016   Vitamin D deficiency 11/13/2016   Sinus pain 09/24/2016   Knee pain, bilateral 09/24/2016   Toe pain 09/24/2016   S/P cesarean section 11/03/2014   Poor weight gain of pregnancy    Insomnia 06/02/2014   GERD without esophagitis 05/16/2014   ALLERGIC RHINITIS 03/19/2007    Past  Surgical History:  Procedure Laterality Date   CESAREAN SECTION     CESAREAN SECTION WITH BILATERAL TUBAL LIGATION Bilateral 11/03/2014   Procedure: CESAREAN SECTION WITH BILATERAL TUBAL LIGATION;  Surgeon: Shelly Bombard, MD;  Location: [facility];  Service: Obstetrics;  Laterality: Bilateral;   CHOLECYSTECTOMY     CHOLECYSTECTOMY, LAPAROSCOPIC     DILATION AND CURETTAGE OF UTERUS     INCISIONAL HERNIA REPAIR N/A 03/27/2016   Procedure: LAPAROSCOPIC ASSISTED REPAIR OF INCARCERATED  INCISIONAL HERNIA;  Surgeon: Greer Pickerel, MD;  Location: [facility];  Service: General;  Laterality: N/A;   INSERTION OF MESH N/A 03/27/2016   Procedure: INSERTION OF MESH;  Surgeon: Greer Pickerel, MD;  Location: [facility];  Service: General;  Laterality: N/A;   THERAPEUTIC ABORTION     TUBAL LIGATION      OB History    Gravida  8   Para  3   Term  3   Preterm  0   AB  4   Living  3     SAB  1   TAB  3   Ectopic      Multiple  0   Live Births  2            Home Medications    Prior to Admission medications   Medication  Sig Start Date End Date Taking? Authorizing Provider  diclofenac sodium (VOLTAREN) 1 % GEL Apply up to 4 times daily to knee for pain. 10/09/16   Rogue Bussing, MD  fluconazole (DIFLUCAN) 150 MG tablet Take 1 tablet (150 mg total) by mouth daily. 12/30/19   Loura Halt A, NP  ibuprofen (ADVIL,MOTRIN) 200 MG tablet Take 600 mg by mouth every 6 (six) hours as needed for mild pain.    [provider]  metroNIDAZOLE (FLAGYL) 500 MG tablet Take 1 tablet (500 mg total) by mouth 2 (two) times daily. 12/30/19   Loura Halt A, NP  norgestimate-ethinyl estradiol (ORTHO-CYCLEN,SPRINTEC,PREVIFEM) 0.25-35 MG-MCG tablet Take 1 tablet by mouth daily. 12/11/16   Rogue Bussing, MD  predniSONE (STERAPRED UNI-PAK 21 TAB) 10 MG (21) TBPK tablet 6 tabs for 1 day, then 5 tabs for 1 das, then 4 tabs for 1 day, then 3 tabs for 1 day, 2 tabs for 1 day, then 1 tab for 1  day 12/30/19   Loura Halt A, NP  pseudoephedrine (SUDAFED) 30 MG tablet Take 30 mg by mouth every 4 (four) hours as needed for congestion.    [provider]  Vitamin D, Ergocalciferol, (DRISDOL) 50000 units CAPS capsule Take 1 capsule (50,000 Units total) by mouth every 7 (seven) days. 11/13/16   Lovenia Kim, MD  ipratropium (ATROVENT) 0.06 % nasal spray Place 2 sprays into both nostrils 4 (four) times daily. Patient not taking: Reported on 10/14/2016 09/05/16 12/30/19  Lysbeth Penner, FNP    Family History Family History  Problem Relation Age of Onset   Diabetes Father    Hypertension Father    Hyperlipidemia Father    Cancer Maternal Aunt        breast   Hypertension Paternal Grandmother    Stroke Paternal Grandmother     Social History Social History   Tobacco Use   Smoking status: Former Smoker    Types: Cigarettes    Quit date: 07/15/2015    Years since quitting: 4.4   Smokeless tobacco: Never Used   Tobacco comment: prior to preg  Substance Use Topics   Alcohol use: No    Alcohol/week: 0.0 standard drinks   Drug use: Yes    Types: Marijuana     Allergies   Levofloxacin   Review of Systems Review of Systems   Physical Exam Triage Vital Signs ED Triage Vitals  Enc Vitals Group     BP 12/30/19 1335 (!) 155/93     Pulse Rate 12/30/19 1335 81     Resp 12/30/19 1335 16     Temp 12/30/19 1335 98.1 F (36.7 C)     Temp Source 12/30/19 1335 Oral     SpO2 12/30/19 1335 100 %     Weight --      Height --      Head Circumference --      Peak Flow --      Pain Score 12/30/19 1346 0     Pain Loc --      Pain Edu? --      Excl. in [place]? --    No data found.  Updated Vital Signs BP (!) 155/93 (BP Location: Left Arm)    Pulse 81    Temp 98.1 F (36.7 C) (Oral)    Resp 16    LMP 12/05/2019    SpO2 100%   Visual Acuity Right Eye Distance:   Left Eye Distance:   Bilateral Distance:  Right Eye Near:   Left Eye Near:    Bilateral Near:      Physical Exam Vitals and nursing note reviewed.  Constitutional:      General: She is not in acute distress.    Appearance: Normal appearance. She is not ill-appearing, toxic-appearing or diaphoretic.  HENT:     Head: Normocephalic.     Nose: Nose normal.  Eyes:     Conjunctiva/sclera: Conjunctivae normal.  Pulmonary:     Effort: Pulmonary effort is normal.  Musculoskeletal:        General: Normal range of motion.     Cervical back: Normal range of motion.       Feet:  Feet:     Comments: TTP No swelling, bruising, deformity.  Color and temperature normal.   Skin:    General: Skin is warm and dry.     Findings: No rash.  Neurological:     Mental Status: She is alert.  Psychiatric:        Mood and Affect: Mood normal.      UC Treatments / Results  Labs (all labs ordered are listed, but only abnormal results are displayed) Labs Reviewed - No data to display  EKG   Radiology No results found.  Procedures Procedures (including critical care time)  Medications Ordered in UC Medications - No data to display  Initial Impression / Assessment and Plan / UC Course  I have reviewed the triage vital signs and the nursing notes.  Pertinent labs & imaging results that were available during my care of the patient were reviewed by me and considered in my medical decision making (see chart for details).     Plantar fasciitis Recommended insoles for shoes and more supportive shoes. Treating with prednisone.  Information given on plantar fasciitis.  Vaginal discharge.  Treating for BV and yeast based on symptoms and history. Follow up as needed for continued or worsening symptoms  Final Clinical Impressions(s) / UC Diagnoses   Final diagnoses:  Plantar fasciitis  Vaginal discharge     Discharge Instructions     Treating you for BV and yeast. Take medication as prescribed. Prednisone for plantar fasciitis Information given on plantar fasciitis Recommended  insoles for shoes and wear comfortable supportive shoes.     ED Prescriptions    Medication Sig Dispense Auth. Provider   metroNIDAZOLE (FLAGYL) 500 MG tablet Take 1 tablet (500 mg total) by mouth 2 (two) times daily. 14 tablet Lanett Lasorsa A, NP   fluconazole (DIFLUCAN) 150 MG tablet Take 1 tablet (150 mg total) by mouth daily. 2 tablet Rosey Eide A, NP   predniSONE (STERAPRED UNI-PAK 21 TAB) 10 MG (21) TBPK tablet 6 tabs for 1 day, then 5 tabs for 1 das, then 4 tabs for 1 day, then 3 tabs for 1 day, 2 tabs for 1 day, then 1 tab for 1 day 21 tablet Danene Montijo A, NP     PDMP not reviewed this encounter.   Loura Halt A, NP 12/30/19 1442

## 2019-12-30 NOTE — ED Triage Notes (Signed)
Pt presents with complaints of possible Bv and yeast infection. States she has been having vaginal irritation x 2 weeks. Reports white discharge.  Pt also states that for the last two years when she wakes up in the morning she has severe pain to the bottom of her feet. Denies any injury.

## 2020-02-29 ENCOUNTER — Ambulatory Visit (HOSPITAL_COMMUNITY)
Admission: EM | Admit: 2020-02-29 | Discharge: 2020-02-29 | Disposition: A | Discharge status: Home / Self Care | Payer: Medicaid Other | Attending: Family Medicine | Admitting: Family Medicine

## 2020-02-29 ENCOUNTER — Other Ambulatory Visit: Payer: Self-pay

## 2020-02-29 ENCOUNTER — Encounter (HOSPITAL_COMMUNITY): Payer: Self-pay

## 2020-02-29 DIAGNOSIS — Z87891 Personal history of nicotine dependence: Secondary | ICD-10-CM | POA: Insufficient documentation

## 2020-02-29 DIAGNOSIS — R197 Diarrhea, unspecified: Secondary | ICD-10-CM

## 2020-02-29 DIAGNOSIS — R112 Nausea with vomiting, unspecified: Secondary | ICD-10-CM | POA: Insufficient documentation

## 2020-02-29 DIAGNOSIS — Z20822 Contact with and (suspected) exposure to covid-19: Secondary | ICD-10-CM | POA: Insufficient documentation

## 2020-02-29 DIAGNOSIS — N898 Other specified noninflammatory disorders of vagina: Secondary | ICD-10-CM | POA: Insufficient documentation

## 2020-02-29 DIAGNOSIS — Z79899 Other long term (current) drug therapy: Secondary | ICD-10-CM | POA: Insufficient documentation

## 2020-02-29 LAB — SARS CORONAVIRUS 2 (TAT 6-24 HRS): SARS Coronavirus 2: NEGATIVE

## 2020-02-29 MED ORDER — ONDANSETRON 4 MG PO TBDP: 4.0000 mg | ORAL_TABLET | Freq: Three times a day (TID) | ORAL | 0 refills | Status: DC | PRN

## 2020-02-29 MED ORDER — FLUCONAZOLE 150 MG PO TABS: 150.0000 mg | ORAL_TABLET | Freq: Every day | ORAL | 0 refills | Status: DC

## 2020-02-29 NOTE — Discharge Instructions (Signed)
This is most likely some sort of viral illness.  Use the Zofran as needed.  Recommended drinking fluids to stay hydrated.  Advance diet as tolerated Diflucan for yeast infection Follow up as needed for continued or worsening symptoms

## 2020-02-29 NOTE — ED Triage Notes (Signed)
Pt presents with diarrhea and vomiting since yesterday; vaginal itching x 1 week.

## 2020-03-02 NOTE — ED Provider Notes (Signed)
[place]    CSN: [id number] Arrival date & time: 02/29/20  2423      History   Chief Complaint Chief Complaint  Patient presents with   Diarrhea   Emesis    HPI [name] is a 39 y.o. female.   Patient is a 39 year old female who presents today with nausea, vomiting and diarrhea since yesterday.  Symptoms have been constant.  Last vomitus was this morning.  She has been able to hold down fluids.  No fever, chills, body aches or night sweats. She is also had vaginal itching and irritation.  History of yeast infections and BV.  Denies any specific vaginal discharge.  Denies any concern for STDs.  Denies any abdominal pain, back pain, dysuria, hematuria or urinary frequency.     Past Medical History:  Diagnosis Date   Allergy    Anxiety    Cough 03/21/2016   no fever, states coughing up "snot colored cold stuff"-  has had this x 1.5 weeks and is getting much better- taking tylenol cold   Depression    never been treated , "probably got some "   Headache(784.0)    Tooth ache    right upper molar partially broke off    Patient Active Problem List   Diagnosis Date Noted   Arthralgia of left upper arm 08/07/2019   Screening examination for STD (sexually transmitted disease) 12/11/2016   Menorrhagia 12/11/2016   Skin mole 12/11/2016   Vitamin D deficiency 11/13/2016   Sinus pain 09/24/2016   Knee pain, bilateral 09/24/2016   Toe pain 09/24/2016   S/P cesarean section 11/03/2014   Poor weight gain of pregnancy    Insomnia 06/02/2014   GERD without esophagitis 05/16/2014   ALLERGIC RHINITIS 03/19/2007    Past Surgical History:  Procedure Laterality Date   CESAREAN SECTION     CESAREAN SECTION WITH BILATERAL TUBAL LIGATION Bilateral 11/03/2014   Procedure: CESAREAN SECTION WITH BILATERAL TUBAL LIGATION;  Surgeon: Shelly Bombard, MD;  Location: [facility];  Service: Obstetrics;  Laterality: Bilateral;   CHOLECYSTECTOMY      CHOLECYSTECTOMY, LAPAROSCOPIC     DILATION AND CURETTAGE OF UTERUS     INCISIONAL HERNIA REPAIR N/A 03/27/2016   Procedure: LAPAROSCOPIC ASSISTED REPAIR OF INCARCERATED  INCISIONAL HERNIA;  Surgeon: Greer Pickerel, MD;  Location: [facility];  Service: General;  Laterality: N/A;   INSERTION OF MESH N/A 03/27/2016   Procedure: INSERTION OF MESH;  Surgeon: Greer Pickerel, MD;  Location: [facility];  Service: General;  Laterality: N/A;   THERAPEUTIC ABORTION     TUBAL LIGATION      OB History    Gravida  8   Para  3   Term  3   Preterm  0   AB  4   Living  3     SAB  1   TAB  3   Ectopic      Multiple  0   Live Births  2            Home Medications    Prior to Admission medications   Medication Sig Start Date End Date Taking? Authorizing Provider  diclofenac sodium (VOLTAREN) 1 % GEL Apply up to 4 times daily to knee for pain. 10/09/16   Rogue Bussing, MD  fluconazole (DIFLUCAN) 150 MG tablet Take 1 tablet (150 mg total) by mouth daily. 02/29/20   Loura Halt A, NP  ibuprofen (ADVIL,MOTRIN) 200 MG tablet Take 600 mg by  mouth every 6 (six) hours as needed for mild pain.    [provider]  norgestimate-ethinyl estradiol (ORTHO-CYCLEN,SPRINTEC,PREVIFEM) 0.25-35 MG-MCG tablet Take 1 tablet by mouth daily. 12/11/16   Rogue Bussing, MD  ondansetron (ZOFRAN ODT) 4 MG disintegrating tablet Take 1 tablet (4 mg total) by mouth every 8 (eight) hours as needed for nausea or vomiting. 02/29/20   Loura Halt A, NP  pseudoephedrine (SUDAFED) 30 MG tablet Take 30 mg by mouth every 4 (four) hours as needed for congestion.    [provider]  Vitamin D, Ergocalciferol, (DRISDOL) 50000 units CAPS capsule Take 1 capsule (50,000 Units total) by mouth every 7 (seven) days. 11/13/16   Lovenia Kim, MD  ipratropium (ATROVENT) 0.06 % nasal spray Place 2 sprays into both nostrils 4 (four) times daily. Patient not taking: Reported on 10/14/2016 09/05/16 12/30/19  Lysbeth Penner, FNP    Family History Family History  Problem Relation Age of Onset   Diabetes Father    Hypertension Father    Hyperlipidemia Father    Cancer Maternal Aunt        breast   Hypertension Paternal Grandmother    Stroke Paternal Grandmother     Social History Social History   Tobacco Use   Smoking status: Former Smoker    Types: Cigarettes    Quit date: 07/15/2015    Years since quitting: 4.6   Smokeless tobacco: Never Used   Tobacco comment: prior to preg  Substance Use Topics   Alcohol use: No    Alcohol/week: 0.0 standard drinks   Drug use: Yes    Types: Marijuana     Allergies   Levofloxacin   Review of Systems Review of Systems   Physical Exam Triage Vital Signs ED Triage Vitals  Enc Vitals Group     BP 02/29/20 1012 (!) 128/7     Pulse --      Resp --      Temp 02/29/20 1012 98.4 F (36.9 C)     Temp Source 02/29/20 1012 Oral     SpO2 02/29/20 1012 100 %     Weight --      Height --      Head Circumference --      Peak Flow --      Pain Score 02/29/20 1011 0     Pain Loc --      Pain Edu? --      Excl. in [place]? --    No data found.  Updated Vital Signs BP (!) 128/7 (BP Location: Right Arm)    Temp 98.4 F (36.9 C) (Oral)    LMP  (Within Weeks) Comment: 3 weeks   SpO2 100%   Visual Acuity Right Eye Distance:   Left Eye Distance:   Bilateral Distance:    Right Eye Near:   Left Eye Near:    Bilateral Near:     Physical Exam Vitals and nursing note reviewed.  Constitutional:      General: She is not in acute distress.    Appearance: Normal appearance. She is not ill-appearing, toxic-appearing or diaphoretic.  HENT:     Head: Normocephalic.     Nose: Nose normal.  Eyes:     Conjunctiva/sclera: Conjunctivae normal.  Pulmonary:     Effort: Pulmonary effort is normal.  Musculoskeletal:        General: Normal range of motion.     Cervical back: Normal range of motion.  Skin:    General: Skin  is warm and dry.      Findings: No rash.  Neurological:     Mental Status: She is alert.  Psychiatric:        Mood and Affect: Mood normal.      UC Treatments / Results  Labs (all labs ordered are listed, but only abnormal results are displayed) Labs Reviewed  SARS CORONAVIRUS 2 (TAT 6-24 HRS)    EKG   Radiology No results found.  Procedures Procedures (including critical care time)  Medications Ordered in UC Medications - No data to display  Initial Impression / Assessment and Plan / UC Course  I have reviewed the triage vital signs and the nursing notes.  Pertinent labs & imaging results that were available during my care of the patient were reviewed by me and considered in my medical decision making (see chart for details).     Nausea vomiting diarrhea Most likely viral.  Recommended Zofran as needed.  To sip fluids to stay hydrated.  Advance diet as tolerated  Vaginal itching Treating for yeast infection based on symptoms and history Follow up as needed for continued or worsening symptoms  Final Clinical Impressions(s) / UC Diagnoses   Final diagnoses:  Nausea vomiting and diarrhea  Itching in the vaginal area     Discharge Instructions     This is most likely some sort of viral illness.  Use the Zofran as needed.  Recommended drinking fluids to stay hydrated.  Advance diet as tolerated Diflucan for yeast infection Follow up as needed for continued or worsening symptoms     ED Prescriptions    Medication Sig Dispense Auth. Provider   fluconazole (DIFLUCAN) 150 MG tablet Take 1 tablet (150 mg total) by mouth daily. 2 tablet Aiman Noe A, NP   ondansetron (ZOFRAN ODT) 4 MG disintegrating tablet Take 1 tablet (4 mg total) by mouth every 8 (eight) hours as needed for nausea or vomiting. 20 tablet Loura Halt A, NP     PDMP not reviewed this encounter.   Loura Halt A, NP 03/02/20 1048

## 2020-05-17 ENCOUNTER — Other Ambulatory Visit: Payer: Self-pay

## 2020-05-17 ENCOUNTER — Encounter (HOSPITAL_COMMUNITY): Payer: Self-pay

## 2020-05-17 ENCOUNTER — Ambulatory Visit (HOSPITAL_COMMUNITY)
Admission: EM | Admit: 2020-05-17 | Discharge: 2020-05-17 | Disposition: A | Discharge status: Home / Self Care | Payer: 59 | Attending: Family Medicine | Admitting: Family Medicine

## 2020-05-17 DIAGNOSIS — J0101 Acute recurrent maxillary sinusitis: Secondary | ICD-10-CM

## 2020-05-17 MED ORDER — AMOXICILLIN-POT CLAVULANATE 875-125 MG PO TABS: 1.0000 | ORAL_TABLET | Freq: Two times a day (BID) | ORAL | 0 refills | Status: DC

## 2020-05-17 MED ORDER — FLUTICASONE PROPIONATE 50 MCG/ACT NA SUSP: 2.0000 | Freq: Every day | NASAL | 0 refills | Status: DC

## 2020-05-17 NOTE — ED Provider Notes (Signed)
[facility]    CSN: [id number] Arrival date & time: 05/17/20  1918      History   Chief Complaint Chief Complaint  Patient presents with  . Sinus Pressure  . Dizziness  . Headache  . Otalgia    HPI [name] is a 39 y.o. female.  Patient states that she has had recurring sinusitis.  She currently has symptoms for going on for 2 weeks.  She started off with a cold.  Now she has sinus pressure and pain, purulent sinus drainage, dizziness, sore throat, and headache.  She feels like she needs antibiotics.  She usually responds to Augmentin.  She has been taking over-the-counter medications and Goody powders.  These help temporarily.  She states that she does not take Sudafed because it makes her blood pressure go up.  She has not had regular medical care because she had lack of insurance.  Her insurance just went through November 1 so she is happy to come in for care.  Past Medical History:  Diagnosis Date  . Allergy   . Anxiety   . Cough 03/21/2016   no fever, states coughing up "snot colored cold stuff"-  has had this x 1.5 weeks and is getting much better- taking tylenol cold  . Depression    never been treated , "probably got some "  . Headache(784.0)   . Tooth ache    right upper molar partially broke off    Patient Active Problem List   Diagnosis Date Noted  . Arthralgia of left upper arm 08/07/2019  . Screening examination for STD (sexually transmitted disease) 12/11/2016  . Menorrhagia 12/11/2016  . Skin mole 12/11/2016  . Vitamin D deficiency 11/13/2016  . Sinus pain 09/24/2016  . Knee pain, bilateral 09/24/2016  . Toe pain 09/24/2016  . S/P cesarean section 11/03/2014  . Poor weight gain of pregnancy   . Insomnia 06/02/2014  . GERD without esophagitis 05/16/2014  . ALLERGIC RHINITIS 03/19/2007    Past Surgical History:  Procedure Laterality Date  . CESAREAN SECTION    . CESAREAN SECTION WITH BILATERAL TUBAL LIGATION Bilateral 11/03/2014     Procedure: CESAREAN SECTION WITH BILATERAL TUBAL LIGATION;  Surgeon: Shelly Bombard, MD;  Location: [facility];  Service: Obstetrics;  Laterality: Bilateral;  . CHOLECYSTECTOMY    . CHOLECYSTECTOMY, LAPAROSCOPIC    . DILATION AND CURETTAGE OF UTERUS    . INCISIONAL HERNIA REPAIR N/A 03/27/2016   Procedure: LAPAROSCOPIC ASSISTED REPAIR OF INCARCERATED  INCISIONAL HERNIA;  Surgeon: Greer Pickerel, MD;  Location: [facility];  Service: General;  Laterality: N/A;  . INSERTION OF MESH N/A 03/27/2016   Procedure: INSERTION OF MESH;  Surgeon: Greer Pickerel, MD;  Location: [facility];  Service: General;  Laterality: N/A;  . THERAPEUTIC ABORTION    . TUBAL LIGATION      OB History    Gravida  8   Para  3   Term  3   Preterm  0   AB  4   Living  3     SAB  1   TAB  3   Ectopic      Multiple  0   Live Births  2            Home Medications    Prior to Admission medications   Medication Sig Start Date End Date Taking? Authorizing Provider  amoxicillin-clavulanate (AUGMENTIN) 875-125 MG tablet Take 1 tablet by mouth every 12 (twelve) hours. 05/17/20  Raylene Everts, MD  diclofenac sodium (VOLTAREN) 1 % GEL Apply up to 4 times daily to knee for pain. 10/09/16   Rogue Bussing, MD  fluticasone [facility]) 50 MCG/ACT nasal spray Place 2 sprays into both nostrils daily. 05/17/20   Raylene Everts, MD  ibuprofen (ADVIL,MOTRIN) 200 MG tablet Take 600 mg by mouth every 6 (six) hours as needed for mild pain.    [provider]  norgestimate-ethinyl estradiol (ORTHO-CYCLEN,SPRINTEC,PREVIFEM) 0.25-35 MG-MCG tablet Take 1 tablet by mouth daily. 12/11/16   Rogue Bussing, MD  pseudoephedrine (SUDAFED) 30 MG tablet Take 30 mg by mouth every 4 (four) hours as needed for congestion.    [provider]  Vitamin D, Ergocalciferol, (DRISDOL) 50000 units CAPS capsule Take 1 capsule (50,000 Units total) by mouth every 7 (seven) days. 11/13/16   Lovenia Kim, MD  ipratropium  (ATROVENT) 0.06 % nasal spray Place 2 sprays into both nostrils 4 (four) times daily. Patient not taking: Reported on 10/14/2016 09/05/16 12/30/19  Lysbeth Penner, FNP    Family History Family History  Problem Relation Age of Onset  . Diabetes Father   . Hypertension Father   . Hyperlipidemia Father   . Cancer Maternal Aunt        breast  . Hypertension Paternal Grandmother   . Stroke Paternal Grandmother     Social History Social History   Tobacco Use  . Smoking status: Former Smoker    Types: Cigarettes    Quit date: 07/15/2015    Years since quitting: 4.8  . Smokeless tobacco: Never Used  . Tobacco comment: prior to preg  Substance Use Topics  . Alcohol use: No    Alcohol/week: 0.0 standard drinks  . Drug use: Yes    Types: Marijuana     Allergies   Levofloxacin   Review of Systems Review of Systems  See HPI Physical Exam Triage Vital Signs ED Triage Vitals  Enc Vitals Group     BP 05/17/20 2018 (!) 167/90     Pulse Rate 05/17/20 2018 81     Resp 05/17/20 2018 18     Temp 05/17/20 2018 98.2 F (36.8 C)     Temp Source 05/17/20 2018 Oral     SpO2 05/17/20 2018 100 %     Weight --      Height --      Head Circumference --      Peak Flow --      Pain Score 05/17/20 2016 5     Pain Loc --      Pain Edu? --      Excl. in [place]? --    No data found.  Updated Vital Signs BP (!) 167/90 (BP Location: Right Arm)   Pulse 81   Temp 98.2 F (36.8 C) (Oral)   Resp 18   LMP 05/02/2020   SpO2 100%     Physical Exam Constitutional:      General: She is not in acute distress.    Appearance: She is well-developed.     Comments: Appears tired  HENT:     Head: Normocephalic and atraumatic.     Right Ear: Tympanic membrane, ear canal and external ear normal.     Left Ear: Tympanic membrane, ear canal and external ear normal.     Nose: Congestion present.     Mouth/Throat:     Mouth: Mucous membranes are moist.     Pharynx: No posterior oropharyngeal  erythema.  Comments: Nasal membranes are swollen and red.  Halitosis.  Sinuses are tender Eyes:     Conjunctiva/sclera: Conjunctivae normal.     Pupils: Pupils are equal, round, and reactive to light.  Cardiovascular:     Rate and Rhythm: Normal rate.     Heart sounds: Normal heart sounds.  Pulmonary:     Effort: Pulmonary effort is normal. No respiratory distress.     Breath sounds: Normal breath sounds.  Abdominal:     Palpations: Abdomen is soft.  Musculoskeletal:        General: Normal range of motion.     Cervical back: Normal range of motion.  Lymphadenopathy:     Cervical: No cervical adenopathy.  Skin:    General: Skin is warm and dry.  Neurological:     Mental Status: She is alert.  Psychiatric:        Behavior: Behavior normal.      UC Treatments / Results  Labs (all labs ordered are listed, but only abnormal results are displayed) Labs Reviewed - No data to display  EKG   Radiology No results found.  Procedures Procedures (including critical care time)  Medications Ordered in UC Medications - No data to display  Initial Impression / Assessment and Plan / UC Course  I have reviewed the triage vital signs and the nursing notes.  Pertinent labs & imaging results that were available during my care of the patient were reviewed by me and considered in my medical decision making (see chart for details).     Acute sinusitis.  We will treat accordingly.  Follow-up with ENT if fails to improve Final Clinical Impressions(s) / UC Diagnoses   Final diagnoses:  Acute recurrent maxillary sinusitis     Discharge Instructions     Continue to drink plenty of fluids Take antibiotic 2 times a day with food Use the Flonase twice a day until your symptoms improve, then once a day Expect improvement over the next 2 to 3 days Return if not better by next week   ED Prescriptions    Medication Sig Dispense Auth. Provider   amoxicillin-clavulanate (AUGMENTIN)  875-125 MG tablet Take 1 tablet by mouth every 12 (twelve) hours. 14 tablet Raylene Everts, MD   fluticasone [facility]) 50 MCG/ACT nasal spray Place 2 sprays into both nostrils daily. 16 g Raylene Everts, MD     PDMP not reviewed this encounter.   Raylene Everts, MD 05/17/20 2049

## 2020-05-17 NOTE — ED Triage Notes (Signed)
Pt presents with sinus pressure, headache, bilateral ear pain, and dizziness X 2 weeks.

## 2020-05-17 NOTE — Discharge Instructions (Addendum)
Continue to drink plenty of fluids Take antibiotic 2 times a day with food Use the Flonase twice a day until your symptoms improve, then once a day Expect improvement over the next 2 to 3 days Return if not better by next week

## 2020-05-18 ENCOUNTER — Telehealth (HOSPITAL_COMMUNITY): Payer: Self-pay | Admitting: Emergency Medicine

## 2020-05-18 MED ORDER — AMOXICILLIN-POT CLAVULANATE 875-125 MG PO TABS: 1.0000 | ORAL_TABLET | Freq: Two times a day (BID) | ORAL | 0 refills | Status: DC

## 2020-05-18 MED ORDER — FLUTICASONE PROPIONATE 50 MCG/ACT NA SUSP: 2.0000 | Freq: Every day | NASAL | 0 refills | Status: DC

## 2020-05-18 NOTE — Telephone Encounter (Signed)
Change in pharmacy

## 2020-11-27 ENCOUNTER — Other Ambulatory Visit: Payer: Self-pay

## 2020-11-27 ENCOUNTER — Encounter (HOSPITAL_COMMUNITY): Payer: Self-pay

## 2020-11-27 ENCOUNTER — Ambulatory Visit (HOSPITAL_COMMUNITY)
Admission: EM | Admit: 2020-11-27 | Discharge: 2020-11-27 | Disposition: A | Discharge status: Home / Self Care | Payer: 59 | Attending: Emergency Medicine | Admitting: Emergency Medicine

## 2020-11-27 DIAGNOSIS — B373 Candidiasis of vulva and vagina: Secondary | ICD-10-CM | POA: Diagnosis not present

## 2020-11-27 DIAGNOSIS — M25462 Effusion, left knee: Secondary | ICD-10-CM | POA: Diagnosis not present

## 2020-11-27 DIAGNOSIS — B3731 Acute candidiasis of vulva and vagina: Secondary | ICD-10-CM

## 2020-11-27 DIAGNOSIS — M25461 Effusion, right knee: Secondary | ICD-10-CM

## 2020-11-27 DIAGNOSIS — J302 Other seasonal allergic rhinitis: Secondary | ICD-10-CM

## 2020-11-27 DIAGNOSIS — M79602 Pain in left arm: Secondary | ICD-10-CM

## 2020-11-27 MED ORDER — FLUTICASONE PROPIONATE 50 MCG/ACT NA SUSP: 1.0000 | Freq: Every day | NASAL | 0 refills | Status: DC

## 2020-11-27 MED ORDER — FLUCONAZOLE 150 MG PO TABS: 150.0000 mg | ORAL_TABLET | Freq: Every day | ORAL | 0 refills | Status: DC

## 2020-11-27 MED ORDER — NAPROXEN 500 MG PO TABS: 500.0000 mg | ORAL_TABLET | Freq: Two times a day (BID) | ORAL | 0 refills | Status: DC

## 2020-11-27 NOTE — Discharge Instructions (Signed)
For your Knee and Arm pain:  Take the Naproxen twice a day for pain.   Rest as much as possible Ice for 10-15 minutes every 4-6 hours as needed for pain and swelling Compression- use an ace bandage or splint as needed for comfort Elevate above your hip/heart when sitting and laying down  Follow up with sports medicine or orthopedics if symptoms do not improve in the next few days.   For seasonal allergies/sinusitis:  Take the Flonase 1-2 sprays in each nostril daily.   For your Vaginitis: Take 1 diflucan today.  Take another diflucan in 3 days if you are still having symptoms.

## 2020-11-27 NOTE — ED Provider Notes (Signed)
[place]    CSN: [id number] Arrival date & time: 11/27/20  1921      History   Chief Complaint Chief Complaint  Patient presents with  . Knee Pain  . Wrist Pain  . Vaginal Itching  . Sinusitis    HPI SHEMEKA [name] is a 40 y.o. female.   Patient here for evaluation of bilateral knee pain and swelling, bilateral lower leg pain, left wrist/elbow pain.  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  Recently switched jobs and is no longer as active but is still having significant joint pain.  Reports using ice on some areas with some pain relief.  Has not taken any OTC medications.  Reports being diagnosed with carpal tunnel but did not follow-up with orthopedics.  Also reports having seasonal allergies and requesting a refill on her Flonase.  Lastly patient reports having a lot of vaginal irritation and a thick white discharge.  Reports symptoms similar to yeast infections that she has had in the past.  Denies any urgency, dysuria, or frequency.  No concerns of STIs.  The history is provided by the patient.  Knee Pain Wrist Pain  Vaginal Itching  Sinusitis   Past Medical History:  Diagnosis Date  . Allergy   . Anxiety   . Cough 03/21/2016   no fever, states coughing up "snot colored cold stuff"-  has had this x 1.5 weeks and is getting much better- taking tylenol cold  . Depression    never been treated , "probably got some "  . Headache(784.0)   . Tooth ache    right upper molar partially broke off    Patient Active Problem List   Diagnosis Date Noted  . Arthralgia of left upper arm 08/07/2019  . Screening examination for STD (sexually transmitted disease) 12/11/2016  . Menorrhagia 12/11/2016  . Skin mole 12/11/2016  . Vitamin D deficiency 11/13/2016  . Sinus pain 09/24/2016  . Knee pain, bilateral 09/24/2016  . Toe pain 09/24/2016  . S/P cesarean section 11/03/2014  . Poor weight gain of pregnancy   .  Insomnia 06/02/2014  . GERD without esophagitis 05/16/2014  . ALLERGIC RHINITIS 03/19/2007    Past Surgical History:  Procedure Laterality Date  . CESAREAN SECTION    . CESAREAN SECTION WITH BILATERAL TUBAL LIGATION Bilateral 11/03/2014   Procedure: CESAREAN SECTION WITH BILATERAL TUBAL LIGATION;  Surgeon: Shelly Bombard, MD;  Location: [facility];  Service: Obstetrics;  Laterality: Bilateral;  . CHOLECYSTECTOMY    . CHOLECYSTECTOMY, LAPAROSCOPIC    . DILATION AND CURETTAGE OF UTERUS    . INCISIONAL HERNIA REPAIR N/A 03/27/2016   Procedure: LAPAROSCOPIC ASSISTED REPAIR OF INCARCERATED  INCISIONAL HERNIA;  Surgeon: Greer Pickerel, MD;  Location: [facility];  Service: General;  Laterality: N/A;  . INSERTION OF MESH N/A 03/27/2016   Procedure: INSERTION OF MESH;  Surgeon: Greer Pickerel, MD;  Location: [facility];  Service: General;  Laterality: N/A;  . THERAPEUTIC ABORTION    . TUBAL LIGATION      OB History    Gravida  8   Para  3   Term  3   Preterm  0   AB  4   Living  3     SAB  1   IAB  3   Ectopic      Multiple  0   Live Births  2  Home Medications    Prior to Admission medications   Medication Sig Start Date End Date Taking? Authorizing Provider  fluconazole (DIFLUCAN) 150 MG tablet Take 1 tablet (150 mg total) by mouth daily. Take one tablet now and one in 3 days if you are still having symptoms 11/27/20  Yes Pearson Forster, NP  fluticasone (FLONASE) 50 MCG/ACT nasal spray Place 1 spray into both nostrils daily. 11/27/20  Yes Pearson Forster, NP  naproxen (NAPROSYN) 500 MG tablet Take 1 tablet (500 mg total) by mouth 2 (two) times daily. 11/27/20  Yes Pearson Forster, NP  amoxicillin-clavulanate (AUGMENTIN) 875-125 MG tablet Take 1 tablet by mouth every 12 (twelve) hours. 05/18/20   Raylene Everts, MD  diclofenac sodium (VOLTAREN) 1 % GEL Apply up to 4 times daily to knee for pain. 10/09/16   Rogue Bussing, MD  ibuprofen (ADVIL,MOTRIN) 200 MG tablet  Take 600 mg by mouth every 6 (six) hours as needed for mild pain.    [provider]  norgestimate-ethinyl estradiol (ORTHO-CYCLEN,SPRINTEC,PREVIFEM) 0.25-35 MG-MCG tablet Take 1 tablet by mouth daily. 12/11/16   Rogue Bussing, MD  pseudoephedrine (SUDAFED) 30 MG tablet Take 30 mg by mouth every 4 (four) hours as needed for congestion.    [provider]  Vitamin D, Ergocalciferol, (DRISDOL) 50000 units CAPS capsule Take 1 capsule (50,000 Units total) by mouth every 7 (seven) days. 11/13/16   Lovenia Kim, MD  ipratropium (ATROVENT) 0.06 % nasal spray Place 2 sprays into both nostrils 4 (four) times daily. Patient not taking: Reported on 10/14/2016 09/05/16 12/30/19  Lysbeth Penner, FNP    Family History Family History  Problem Relation Age of Onset  . Diabetes Father   . Hypertension Father   . Hyperlipidemia Father   . Cancer Maternal Aunt        breast  . Hypertension Paternal Grandmother   . Stroke Paternal Grandmother     Social History Social History   Tobacco Use  . Smoking status: Former Smoker    Types: Cigarettes    Quit date: 07/15/2015    Years since quitting: 5.3  . Smokeless tobacco: Never Used  . Tobacco comment: prior to preg  Substance Use Topics  . Alcohol use: No    Alcohol/week: 0.0 standard drinks  . Drug use: Yes    Types: Marijuana     Allergies   Levofloxacin   Review of Systems Review of Systems  HENT: Positive for sinus pressure.   Genitourinary: Positive for vaginal discharge.  Musculoskeletal: Positive for arthralgias and joint swelling.  All other systems reviewed and are negative.    Physical Exam Triage Vital Signs ED Triage Vitals  Enc Vitals Group     BP 11/27/20 2012 127/71     Pulse Rate 11/27/20 2010 83     Resp 11/27/20 2010 18     Temp 11/27/20 2010 98.5 F (36.9 C)     Temp Source 11/27/20 2010 Oral     SpO2 11/27/20 2010 100 %     Weight --      Height --      Head Circumference --       Peak Flow --      Pain Score 11/27/20 2009 9     Pain Loc --      Pain Edu? --      Excl. in [place]? --    No data found.  Updated Vital Signs BP 127/71   Pulse 83  Temp 98.5 F (36.9 C) (Oral)   Resp 18   LMP 11/03/2020 (Approximate)   SpO2 100%   Visual Acuity Right Eye Distance:   Left Eye Distance:   Bilateral Distance:    Right Eye Near:   Left Eye Near:    Bilateral Near:     Physical Exam Vitals and nursing note reviewed.  Constitutional:      General: She is not in acute distress.    Appearance: Normal appearance. She is not ill-appearing, toxic-appearing or diaphoretic.  HENT:     Head: Normocephalic and atraumatic.     Nose: No congestion or rhinorrhea.     Right Sinus: No maxillary sinus tenderness or frontal sinus tenderness.     Left Sinus: No maxillary sinus tenderness or frontal sinus tenderness.  Eyes:     Conjunctiva/sclera: Conjunctivae normal.  Cardiovascular:     Rate and Rhythm: Normal rate.     Pulses: Normal pulses.  Pulmonary:     Effort: Pulmonary effort is normal.  Abdominal:     General: Abdomen is flat.  Musculoskeletal:        General: Normal range of motion.     Right shoulder: Normal.     Left shoulder: Normal.     Right elbow: Normal.     Left elbow: No swelling or deformity. Normal range of motion. Tenderness present.     Right wrist: Normal.     Left wrist: Tenderness present. No swelling, deformity or bony tenderness. Normal range of motion.     Cervical back: Normal range of motion.     Right knee: Swelling and effusion present. No erythema.     Left knee: Swelling and effusion present. No erythema.     Right lower leg: Swelling present. No deformity, lacerations or tenderness.     Left lower leg: Swelling present. No deformity, lacerations or tenderness.  Skin:    General: Skin is warm and dry.  Neurological:     General: No focal deficit present.     Mental Status: She is alert and oriented to person, place, and time.   Psychiatric:        Mood and Affect: Mood normal.      UC Treatments / Results  Labs (all labs ordered are listed, but only abnormal results are displayed) Labs Reviewed - No data to display  EKG   Radiology No results found.  Procedures Procedures (including critical care time)  Medications Ordered in UC Medications - No data to display  Initial Impression / Assessment and Plan / UC Course  I have reviewed the triage vital signs and the nursing notes.  Pertinent labs & imaging results that were available during my care of the patient were reviewed by me and considered in my medical decision making (see chart for details).    Assessment negative for red flags or concerns.  Consider tendinitis or overuse syndrome for joint aches including bilateral knees and left arm.  Recommend naproxen twice daily.  Rest is much as possible, ice, compression, and elevation for comfort.  If symptoms do not improve in the next week consider following up with orthopedics or sports medicine.  Prescribed Flonase 1 to 2 sprays in each nostril daily for seasonal allergies and chronic sinusitis.  Prescribe Diflucan 1 pill today and repeat in 3 days if still having symptoms for yeast vaginitis.  Patient declined STI testing at this time.  Encourage patient to get establish and follow-up with primary care.  PCP assistance started.  Final Clinical Impressions(s) / UC Diagnoses   Final diagnoses:  Bilateral knee swelling  Yeast vaginitis  Left arm pain  Seasonal allergies     Discharge Instructions     For your Knee and Arm pain:  Take the Naproxen twice a day for pain.   Rest as much as possible Ice for 10-15 minutes every 4-6 hours as needed for pain and swelling Compression- use an ace bandage or splint as needed for comfort Elevate above your hip/heart when sitting and laying down  Follow up with sports medicine or orthopedics if symptoms do not improve in the next few days.   For  seasonal allergies/sinusitis:  Take the Flonase 1-2 sprays in each nostril daily.   For your Vaginitis: Take 1 diflucan today.  Take another diflucan in 3 days if you are still having symptoms.     ED Prescriptions    Medication Sig Dispense Auth. Provider   naproxen (NAPROSYN) 500 MG tablet Take 1 tablet (500 mg total) by mouth 2 (two) times daily. 30 tablet Pearson Forster, NP   fluticasone (FLONASE) 50 MCG/ACT nasal spray Place 1 spray into both nostrils daily. 18.2 mL Pearson Forster, NP   fluconazole (DIFLUCAN) 150 MG tablet Take 1 tablet (150 mg total) by mouth daily. Take one tablet now and one in 3 days if you are still having symptoms 2 tablet Pearson Forster, NP     PDMP not reviewed this encounter.   Pearson Forster, NP 11/28/20 [phone]

## 2020-11-27 NOTE — ED Triage Notes (Signed)
Pt c/o swelling on both knees and pain on left wrist. Pt states she thinks she has a yeast infection because of the vaginal itching she is experiencing. Pt states she is also requesting nasal spray.

## 2020-12-02 ENCOUNTER — Encounter: Payer: Self-pay | Admitting: *Deleted

## 2020-12-20 ENCOUNTER — Encounter: Payer: 59 | Admitting: Internal Medicine

## 2021-01-02 ENCOUNTER — Other Ambulatory Visit (HOSPITAL_COMMUNITY)
Admission: RE | Admit: 2021-01-02 | Discharge: 2021-01-02 | Disposition: A | Discharge status: Home / Self Care | Payer: 59 | Source: Ambulatory Visit | Attending: Internal Medicine | Admitting: Internal Medicine

## 2021-01-02 ENCOUNTER — Encounter: Payer: Self-pay | Admitting: Internal Medicine

## 2021-01-02 ENCOUNTER — Ambulatory Visit (INDEPENDENT_AMBULATORY_CARE_PROVIDER_SITE_OTHER): Payer: 59 | Admitting: Internal Medicine

## 2021-01-02 VITALS — BP 147/91 | HR 78 | Temp 98.4°F | Ht 63.5 in | Wt 200.7 lb

## 2021-01-02 DIAGNOSIS — N898 Other specified noninflammatory disorders of vagina: Secondary | ICD-10-CM | POA: Diagnosis present

## 2021-01-02 DIAGNOSIS — M25561 Pain in right knee: Secondary | ICD-10-CM

## 2021-01-02 DIAGNOSIS — G8929 Other chronic pain: Secondary | ICD-10-CM

## 2021-01-02 DIAGNOSIS — K644 Residual hemorrhoidal skin tags: Secondary | ICD-10-CM

## 2021-01-02 DIAGNOSIS — M25532 Pain in left wrist: Secondary | ICD-10-CM

## 2021-01-02 DIAGNOSIS — E559 Vitamin D deficiency, unspecified: Secondary | ICD-10-CM | POA: Diagnosis not present

## 2021-01-02 DIAGNOSIS — M25539 Pain in unspecified wrist: Secondary | ICD-10-CM | POA: Insufficient documentation

## 2021-01-02 DIAGNOSIS — Z5320 Procedure and treatment not carried out because of patient's decision for unspecified reasons: Secondary | ICD-10-CM

## 2021-01-02 DIAGNOSIS — M25531 Pain in right wrist: Secondary | ICD-10-CM

## 2021-01-02 DIAGNOSIS — M25562 Pain in left knee: Secondary | ICD-10-CM

## 2021-01-02 HISTORY — DX: Pain in unspecified wrist: M25.539

## 2021-01-02 HISTORY — DX: Other specified noninflammatory disorders of vagina: N89.8

## 2021-01-02 MED ORDER — DICLOFENAC SODIUM 1 % EX GEL: 2.0000 g | Freq: Four times a day (QID) | CUTANEOUS | 2 refills | Status: DC

## 2021-01-02 NOTE — Assessment & Plan Note (Signed)
Patient complains of bilateral wrist pain ongoing for several years.  She states in the past she was told this was carpal tunnel syndrome.  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  States that she has bilateral wrist pain and numbness and tingling in all 4 digits of both hands.  States that her left wrist is worse than her right and that she has radiating pain to her left elbow and left shoulder.  She states that she has some weakness as well and has noticed that she at times drops things.  She states that it is affecting her activities of daily living, notably when she is filling out paperwork or having to do her hair.  She states that she uses Voltaren gel as well as naproxen at times and this helped provide some relief.    On exam she had positive Phalen's and Tinel's tests.  Range of motion was normal however she did endorse pain with all motion.  Discussed conservative management with trying to splints at night.  Patient does not think this will help.  She states that she would like to proceed with the surgery for carpal tunnel syndrome.  Will proceed with nerve conduction studies and refer for surgery if carpal tunnel syndrome is confirmed.  Plan: Differential diagnosis includes bilateral carpal tunnel syndrome versus other changes from overuse.  Will obtain nerve conduction studies to confirm diagnosis and refer to orthopedics if appropriate.

## 2021-01-02 NOTE — Patient Instructions (Signed)
Is a pleasure meeting you today.  I will call you with the results of your Pap smear and vaginal swab.  For your knee pain I have sent in a prescription of Voltaren gel.  For your wrist pain I want you to try using wrist splints at night to see if this helps your pain.  You can also continue using the Voltaren gel and naproxen for both your wrist and knee pain.  I am also checking some blood work today I will call you with any abnormal results.  And lastly have sent in a referral to general surgery for your hemorrhoids.

## 2021-01-02 NOTE — Assessment & Plan Note (Signed)
Patient complains of hemorrhoids.  She states that she has pain with bowel movements and significant amounts of bleeding intermittently.  At times her hemorrhoids itch.  States that her father had a similar issue and had surgery which she is interested in.  On exam, there is a subcentimeter external hemorrhoid along the left margin of her anus.  It is tender to palpation and firm to touch.  No bleeding noted.  Plan: Refer to general surgery

## 2021-01-02 NOTE — Assessment & Plan Note (Addendum)
Patient presents with vaginal discharge ongoing for the past month nearly.  She was evaluated in urgent care on 5/17 and treated with Diflucan as well x2 with no improvement of her symptoms.  She endorses significant vaginal discharge which is clear in color.  She has intermittent itching which leads to burning with urination if she is scratching.  She denies any vaginal pain or abnormal bleeding.  Denies any sexual activity for the past year.  No history of sexually transmitted infections.  Patient is okay with pelvic exam as well as Pap smear today as she is due.  On exam she had significant white vaginal discharge thin in consistency.  Plan: Follow-up cervicovaginal swab and Pap smear cytology   ADDENDUM: Cervicovaginal swab negative for any infectious etiology.  Pap smear unremarkable. Patient states that she is continuing to have vaginal discharge irritation and itchiness.  She is scheduled to see OB/GYN this Friday.  We will see if they recommend.  Inquired if patient is having any signs of early menopause, she denies any signs or symptoms.  No vaginal atrophy appreciated on exam.

## 2021-01-02 NOTE — Assessment & Plan Note (Signed)
Patient complains of bilateral knee pain ongoing for past several months.  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  She endorses generalized pain around both knee joints as well as posterior left knee pain and swelling a few days ago.  She is able to bear weight and is not having any trouble with range of motion.  Denies any numbness or tingling in her legs.  Denies any injuries or trauma.  On exam she has normal range of motion, strength 5 out of 5 and normal reflexes.  She has been using Voltaren gel and ice which both have helped alleviate her symptoms.  Assessment/plan: This is likely due to overuse from her previous job versus possible early onset arthritic changes.  Recommended to continue conservative management with Voltaren gel and ice.  Patient to return to clinic if symptoms worsen.

## 2021-01-02 NOTE — Assessment & Plan Note (Addendum)
Patient has a history of severe vitamin D deficiency levels less than 10.  She does endorse tiredness and fatigue.  Will recheck her vitamin D levels today.  Addendum: Vitamin D 14.  We will send in a prescription for vitamin D 50,000 units weekly for 6 weeks and then recheck

## 2021-01-02 NOTE — Progress Notes (Signed)
   CC: Establish care, carpal tunnel syndrome  HPI:  Ms.[name] is a 40 y.o. with a past medical history listed below presenting to establish care and evaluation of her carpal tunnel syndrome. For details of today's visit and the status of his chronic medical issues please refer to the assessment and plan.   Past Medical History:  Diagnosis Date   Allergy    Anxiety    Cough 03/21/2016   no fever, states coughing up "snot colored cold stuff"-  has had this x 1.5 weeks and is getting much better- taking tylenol cold   Depression    never been treated , "probably got some "   Headache(784.0)    Tooth ache    right upper molar partially broke off   Surgical history: cholecystectomy, cesarean section  Family history: DM, HTN  Social history: works at [employer], has 3 children all healthy.  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  She is not currently sexually active.  She is a former smoker, quit in 2017.  She drinks alcohol socially.  Denies any illicit drug use.   Review of Systems:   Review of Systems  Constitutional:  Positive for malaise/fatigue. Negative for chills and fever.  Gastrointestinal:  Negative for abdominal pain, blood in stool, constipation, diarrhea, melena, nausea and vomiting.  Genitourinary:  Negative for dysuria, frequency, hematuria and urgency.  Musculoskeletal:  Positive for joint pain.  Neurological:  Positive for tingling and weakness.    Physical Exam:  There were no vitals filed for this visit.  Physical Exam General: alert, appears stated age, in no acute distress HEENT: Normocephalic, atraumatic, EOM intact, conjunctiva normal CV: Regular rate and rhythm, no murmurs rubs or gallops Pulm: Clear to auscultation bilaterally, normal work of breathing Abdomen: Soft, nondistended, bowel sounds present, no tenderness to palpation MSK: Bilateral wrist pain, normal range of motion, positive Tinel's and Phalen's  tests.  Bilateral generalized knee tenderness to palpation Skin: Warm and dry Neuro: Alert and oriented x3 GU: Multiple skin tags appreciated in the pelvic region and lateral to the anus, subcentimeter external hemorrhoid tender to palpation nonbleeding, significant internal light vaginal discharge thin in consistency  Assessment & Plan:   See Encounters Tab for problem based charting.  Patient discussed with Dr.  Jimmye Norman

## 2021-01-03 ENCOUNTER — Other Ambulatory Visit: Payer: Self-pay | Admitting: Internal Medicine

## 2021-01-03 LAB — CERVICOVAGINAL ANCILLARY ONLY
Bacterial Vaginitis (gardnerella): NEGATIVE
Candida Glabrata: NEGATIVE
Candida Vaginitis: NEGATIVE
Chlamydia: NEGATIVE
Comment: NEGATIVE
Comment: NEGATIVE
Comment: NEGATIVE
Comment: NEGATIVE
Comment: NEGATIVE
Comment: NORMAL
Neisseria Gonorrhea: NEGATIVE
Trichomonas: NEGATIVE

## 2021-01-03 LAB — CYTOLOGY - PAP
Adequacy: ABSENT
Comment: NEGATIVE
Diagnosis: NEGATIVE
High risk HPV: NEGATIVE

## 2021-01-03 LAB — HEPATITIS C ANTIBODY: Hep C Virus Ab: <0.1 s/co ratio (ref 0.0–0.9)

## 2021-01-03 LAB — VITAMIN D 25 HYDROXY (VIT D DEFICIENCY, FRACTURES): Vit D, 25-Hydroxy: 14.1 ng/mL — ABNORMAL LOW (ref 30.0–100.0)

## 2021-01-03 MED ORDER — VITAMIN D (ERGOCALCIFEROL) 1.25 MG (50000 UNIT) PO CAPS: 50000.0000 [IU] | ORAL_CAPSULE | ORAL | 0 refills | Status: AC

## 2021-01-03 NOTE — Addendum Note (Signed)
Addended by: Mike Craze on: 01/03/2021 02:31 PM   Modules accepted: Orders

## 2021-01-03 NOTE — Progress Notes (Signed)
Called patient to discuss vitamin D levels.  Sent in a prescription for vitamin D 50,000 units weekly for 6 weeks.

## 2021-01-07 ENCOUNTER — Other Ambulatory Visit: Payer: Self-pay | Admitting: Internal Medicine

## 2021-01-07 ENCOUNTER — Telehealth: Payer: Self-pay

## 2021-01-07 DIAGNOSIS — K644 Residual hemorrhoidal skin tags: Secondary | ICD-10-CM

## 2021-01-07 MED ORDER — HYDROCORTISONE 1 % EX CREA: TOPICAL_CREAM | CUTANEOUS | 1 refills | Status: AC

## 2021-01-07 NOTE — Telephone Encounter (Signed)
Requesting to speak with a nurse about getting hemorrhoids meds and lab results. Please call pt back.

## 2021-01-11 ENCOUNTER — Encounter: Payer: Self-pay | Admitting: Obstetrics

## 2021-01-11 ENCOUNTER — Ambulatory Visit (INDEPENDENT_AMBULATORY_CARE_PROVIDER_SITE_OTHER): Payer: 59 | Admitting: Obstetrics

## 2021-01-11 ENCOUNTER — Other Ambulatory Visit: Payer: Self-pay

## 2021-01-11 VITALS — BP 138/79 | HR 82 | Ht 63.0 in | Wt 198.0 lb

## 2021-01-11 DIAGNOSIS — N971 Female infertility of tubal origin: Secondary | ICD-10-CM | POA: Diagnosis not present

## 2021-01-11 DIAGNOSIS — Z3169 Encounter for other general counseling and advice on procreation: Secondary | ICD-10-CM

## 2021-01-11 MED ORDER — VITAFOL ULTRA 29-0.6-0.4-200 MG PO CAPS: 1.0000 | ORAL_CAPSULE | Freq: Every day | ORAL | 4 refills | Status: DC

## 2021-01-11 NOTE — Progress Notes (Signed)
Patient ID: [name], female   DOB: 1981-05-27, 40 y.o.   MRN: [id number]  Chief Complaint  Patient presents with   New Patient (Initial Visit)    HPI [name] is a 40 y.o. female.  History of tubal sterilization.  Now wants to conceive.  Requesting referral for tubal reversal. HPI  Past Medical History:  Diagnosis Date   Allergy    Anxiety    Cough 03/21/2016   no fever, states coughing up "snot colored cold stuff"-  has had this x 1.5 weeks and is getting much better- taking tylenol cold   Depression    never been treated , "probably got some "   GERD without esophagitis 05/16/2014   Headache(784.0)    S/P cesarean section 11/03/2014   Screening examination for STD (sexually transmitted disease) 12/11/2016   Tooth ache    right upper molar partially broke off    Past Surgical History:  Procedure Laterality Date   CESAREAN SECTION     CESAREAN SECTION WITH BILATERAL TUBAL LIGATION Bilateral 11/03/2014   Procedure: CESAREAN SECTION WITH BILATERAL TUBAL LIGATION;  Surgeon: Shelly Bombard, MD;  Location: [facility];  Service: Obstetrics;  Laterality: Bilateral;   CHOLECYSTECTOMY     CHOLECYSTECTOMY, LAPAROSCOPIC     DILATION AND CURETTAGE OF UTERUS     INCISIONAL HERNIA REPAIR N/A 03/27/2016   Procedure: LAPAROSCOPIC ASSISTED REPAIR OF INCARCERATED  INCISIONAL HERNIA;  Surgeon: Greer Pickerel, MD;  Location: [facility];  Service: General;  Laterality: N/A;   INSERTION OF MESH N/A 03/27/2016   Procedure: INSERTION OF MESH;  Surgeon: Greer Pickerel, MD;  Location: [facility];  Service: General;  Laterality: N/A;   THERAPEUTIC ABORTION     TUBAL LIGATION      Family History  Problem Relation Age of Onset   Diabetes Father    Hypertension Father    Hyperlipidemia Father    Cancer Maternal Aunt        breast   Hypertension Paternal Grandmother    Stroke Paternal Grandmother     Social History Social History   Tobacco Use   Smoking status: Former    Pack years: 0.00    Types:  Cigarettes    Quit date: 07/15/2015    Years since quitting: 5.4   Smokeless tobacco: Never   Tobacco comments:    prior to preg  Substance Use Topics   Alcohol use: No    Alcohol/week: 0.0 standard drinks   Drug use: Yes    Types: Marijuana    Allergies  Allergen Reactions   Levofloxacin Itching    Current Outpatient Medications  Medication Sig Dispense Refill   diclofenac Sodium (VOLTAREN) 1 % GEL Apply 2 g topically 4 (four) times daily. 4 g 2   hydrocortisone cream 1 % Apply to affected area 2 times daily for up to one week (Patient not taking: Reported on 01/11/2021) 30 g 1   naproxen (NAPROSYN) 500 MG tablet Take 1 tablet (500 mg total) by mouth 2 (two) times daily. 30 tablet 0   Prenat-Fe Poly-Methfol-FA-DHA (VITAFOL ULTRA) 29-0.6-0.4-200 MG CAPS Take 1 capsule by mouth daily before breakfast. 90 capsule 4   Vitamin D, Ergocalciferol, (DRISDOL) 1.25 MG (50000 UNIT) CAPS capsule Take 1 capsule (50,000 Units total) by mouth once a week for 6 doses. 6 capsule 0   fluticasone (FLONASE) 50 MCG/ACT nasal spray Place 1 spray into both nostrils daily. (Patient not taking: Reported on 01/11/2021) 18.2 mL 0   No current  facility-administered medications for this visit.    Review of Systems Review of Systems Constitutional: negative for fatigue and weight loss Respiratory: negative for cough and wheezing Cardiovascular: negative for chest pain, fatigue and palpitations Gastrointestinal: negative for abdominal pain and change in bowel habits Genitourinary:negative Integument/breast: negative for nipple discharge Musculoskeletal:negative for myalgias Neurological: negative for gait problems and tremors Behavioral/Psych: negative for abusive relationship, depression Endocrine: negative for temperature intolerance      Blood pressure 138/79, pulse 82, height 5\' 3"  (1.6 m), weight 198 lb (89.8 kg), last menstrual period 01/11/2021.  Physical Exam Physical Exam General:   Alert and no  distress  Skin:   no rash or abnormalities  Lungs:   clear to auscultation bilaterally  Heart:   regular rate and rhythm, S1, S2 normal, no murmur, click, rub or gallop  The remainder of the physical examination deferred because of the nature of the encounter for consultation only.  I have spent a total of 15 minutes of face-to-face and time, excluding clinical staff time, reviewing notes and preparing to see patient, ordering tests and/or medications, and counseling the patient.   Data Reviewed Operative History - tubal sterilization  Assessment     1. Infertility of tubal origin.   Had [facility] BTL with last Cesarean Section. Rx: - Ambulatory referral to Endocrinology  2. Encounter for preconception consultation Rx: - Prenat-Fe Poly-Methfol-FA-DHA (VITAFOL ULTRA) 29-0.6-0.4-200 MG CAPS; Take 1 capsule by mouth daily before breakfast.  Dispense: 90 capsule; Refill: 4     Plan   Referred to Reproductive Endocrinology for infertility consultation  Orders Placed This Encounter  Procedures   Ambulatory referral to Endocrinology    Referral Priority:   Routine    Referral Type:   Consultation    Referral Reason:   Specialty Services Required    Number of Visits Requested:   1   Meds ordered this encounter  Medications   Prenat-Fe Poly-Methfol-FA-DHA (VITAFOL ULTRA) 29-0.6-0.4-200 MG CAPS    Sig: Take 1 capsule by mouth daily before breakfast.    Dispense:  90 capsule    Refill:  4       Shelly Bombard, MD 01/11/2021 11:13 AM

## 2021-01-11 NOTE — Progress Notes (Signed)
Pt last seen 6 yrs ago. She requests referral for tubal reversal.  Pt wants to conceive.  Pt also reports menorrhagia, mood swings, and nausea. BTL completed 2016  Normal pap and GC/CT all negative on 01/02/21.

## 2021-01-15 ENCOUNTER — Encounter: Payer: Self-pay | Admitting: *Deleted

## 2021-01-15 ENCOUNTER — Other Ambulatory Visit: Payer: Self-pay | Admitting: Obstetrics

## 2021-01-15 DIAGNOSIS — Z3169 Encounter for other general counseling and advice on procreation: Secondary | ICD-10-CM

## 2021-01-15 NOTE — Progress Notes (Signed)
Internal Medicine Clinic Attending ° °Case discussed with Dr. Rehman  At the time of the visit.  We reviewed the resident’s history and exam and pertinent patient test results.  I agree with the assessment, diagnosis, and plan of care documented in the resident’s note.  ° °

## 2021-01-20 ENCOUNTER — Other Ambulatory Visit: Payer: Self-pay | Admitting: Internal Medicine

## 2021-01-20 DIAGNOSIS — E559 Vitamin D deficiency, unspecified: Secondary | ICD-10-CM

## 2021-02-18 ENCOUNTER — Other Ambulatory Visit: Payer: Self-pay | Admitting: Internal Medicine

## 2021-02-18 DIAGNOSIS — E559 Vitamin D deficiency, unspecified: Secondary | ICD-10-CM

## 2021-03-01 ENCOUNTER — Encounter: Payer: 59 | Admitting: Internal Medicine

## 2021-04-11 NOTE — Addendum Note (Signed)
Addended by: Buddy Duty on: 04/11/2021 11:29 AM   Modules accepted: Orders

## 2021-05-07 ENCOUNTER — Other Ambulatory Visit: Payer: Self-pay

## 2021-05-07 ENCOUNTER — Encounter (HOSPITAL_COMMUNITY): Payer: Self-pay | Admitting: Emergency Medicine

## 2021-05-07 ENCOUNTER — Ambulatory Visit (HOSPITAL_COMMUNITY)
Admission: EM | Admit: 2021-05-07 | Discharge: 2021-05-07 | Disposition: A | Discharge status: Home / Self Care | Payer: 59 | Attending: Emergency Medicine | Admitting: Emergency Medicine

## 2021-05-07 DIAGNOSIS — K5900 Constipation, unspecified: Secondary | ICD-10-CM

## 2021-05-07 DIAGNOSIS — K649 Unspecified hemorrhoids: Secondary | ICD-10-CM | POA: Diagnosis not present

## 2021-05-07 MED ORDER — LIDOCAINE (ANORECTAL) 50 MG RE SUPP: 50.0000 mg | Freq: Four times a day (QID) | RECTAL | 0 refills | Status: DC

## 2021-05-07 NOTE — Discharge Instructions (Addendum)
Take a stool softer daily  You will need to talk with [facility] about follow up care  Cont to use the hydrocortisone cream with the lidocaine for pain  If bleeding cont go to ER

## 2021-05-07 NOTE — ED Triage Notes (Signed)
Pt is present today with constipation and hemorrhoids. Pt states that her bleeding started Sunday

## 2021-05-07 NOTE — ED Provider Notes (Signed)
[facility]    CSN: [id number] Arrival date & time: 05/07/21  1017      History   Chief Complaint Chief Complaint  Patient presents with   Hemorrhoids   Constipation    HPI [name] is a 40 y.o. female.   Pt is here for constipation and chronic hemorrhoids. Pt had surgery scheduled with [facility] but cancelled it due to time off work. Pt has been taking colace and had loose BM. Then took iodum due to to many BM and now is constipated. Has some rectal bleeding. Called [facility] and they are not able to see today. Denies any abd pain, no dizziness. Has been using OTC prep H since last night with minimal relief.    Past Medical History:  Diagnosis Date   Allergy    Anxiety    Cough 03/21/2016   no fever, states coughing up "snot colored cold stuff"-  has had this x 1.5 weeks and is getting much better- taking tylenol cold   Depression    never been treated , "probably got some "   GERD without esophagitis 05/16/2014   Headache(784.0)    S/P cesarean section 11/03/2014   Screening examination for STD (sexually transmitted disease) 12/11/2016   Tooth ache    right upper molar partially broke off    Patient Active Problem List   Diagnosis Date Noted   Vaginal discharge 01/02/2021   External hemorrhoid 01/02/2021   Wrist pain 01/02/2021   Arthralgia of left upper arm 08/07/2019   Skin mole 12/11/2016   Vitamin D deficiency 11/13/2016   Knee pain, bilateral 09/24/2016   ALLERGIC RHINITIS 03/19/2007    Past Surgical History:  Procedure Laterality Date   CESAREAN SECTION     CESAREAN SECTION WITH BILATERAL TUBAL LIGATION Bilateral 11/03/2014   Procedure: CESAREAN SECTION WITH BILATERAL TUBAL LIGATION;  Surgeon: Shelly Bombard, MD;  Location: [facility];  Service: Obstetrics;  Laterality: Bilateral;   CHOLECYSTECTOMY     CHOLECYSTECTOMY, LAPAROSCOPIC     DILATION AND CURETTAGE OF UTERUS     INCISIONAL HERNIA REPAIR N/A 03/27/2016   Procedure: LAPAROSCOPIC  ASSISTED REPAIR OF INCARCERATED  INCISIONAL HERNIA;  Surgeon: Greer Pickerel, MD;  Location: [facility];  Service: General;  Laterality: N/A;   INSERTION OF MESH N/A 03/27/2016   Procedure: INSERTION OF MESH;  Surgeon: Greer Pickerel, MD;  Location: [facility];  Service: General;  Laterality: N/A;   THERAPEUTIC ABORTION     TUBAL LIGATION      OB History     Gravida  8   Para  3   Term  3   Preterm  0   AB  4   Living  3      SAB  1   IAB  3   Ectopic      Multiple  0   Live Births  2            Home Medications    Prior to Admission medications   Medication Sig Start Date End Date Taking? Authorizing Provider  hydrocortisone cream 1 % Apply to affected area 2 times daily for up to one week Patient not taking: Reported on 01/11/2021 01/07/21 01/07/22  Rehman, Areeg N, DO  Lidocaine, Anorectal, 50 MG SUPP Place 50 mg rectally every 6 (six) hours. 05/07/21  Yes Marney Setting, NP  diclofenac Sodium (VOLTAREN) 1 % GEL Apply 2 g topically 4 (four) times daily. 01/02/21   Rehman, Areeg N, DO  fluticasone (FLONASE) 50 MCG/ACT nasal spray Place 1 spray into both nostrils daily. Patient not taking: Reported on 01/11/2021 11/27/20   Pearson Forster, NP  naproxen (NAPROSYN) 500 MG tablet Take 1 tablet (500 mg total) by mouth 2 (two) times daily. 11/27/20   Pearson Forster, NP  Prenat-Fe Poly-Methfol-FA-DHA (VITAFOL ULTRA) 29-0.6-0.4-200 MG CAPS Take 1 capsule by mouth daily before breakfast. 01/11/21   Shelly Bombard, MD  ipratropium (ATROVENT) 0.06 % nasal spray Place 2 sprays into both nostrils 4 (four) times daily. Patient not taking: Reported on 10/14/2016 09/05/16 12/30/19  Lysbeth Penner, FNP    Family History Family History  Problem Relation Age of Onset   Diabetes Father    Hypertension Father    Hyperlipidemia Father    Cancer Maternal Aunt        breast   Hypertension Paternal Grandmother    Stroke Paternal Grandmother     Social History Social History   Tobacco Use    Smoking status: Former    Types: Cigarettes    Quit date: 07/15/2015    Years since quitting: 5.8   Smokeless tobacco: Never   Tobacco comments:    prior to preg  Substance Use Topics   Alcohol use: No    Alcohol/week: 0.0 standard drinks   Drug use: Yes    Types: Marijuana     Allergies   Levofloxacin   Review of Systems Review of Systems  Constitutional:  Negative for activity change, chills and fever.  Respiratory: Negative.    Cardiovascular: Negative.   Gastrointestinal:  Positive for anal bleeding, constipation, diarrhea and rectal pain. Negative for abdominal distention, abdominal pain, nausea and vomiting.  Genitourinary: Negative.   Musculoskeletal: Negative.   Neurological: Negative.     Physical Exam Triage Vital Signs ED Triage Vitals [05/07/21 1025]  Enc Vitals Group     BP (!) 174/94     Pulse Rate 92     Resp 18     Temp 98.8 F (37.1 C)     Temp src      SpO2 97 %     Weight      Height      Head Circumference      Peak Flow      Pain Score 10     Pain Loc      Pain Edu?      Excl. in [place]?    No data found.  Updated Vital Signs BP (!) 174/94   Pulse 92   Temp 98.8 F (37.1 C)   Resp 18   SpO2 97%   Visual Acuity Right Eye Distance:   Left Eye Distance:   Bilateral Distance:    Right Eye Near:   Left Eye Near:    Bilateral Near:     Physical Exam Constitutional:      Appearance: Normal appearance. She is obese.  Cardiovascular:     Rate and Rhythm: Normal rate.  Pulmonary:     Effort: Pulmonary effort is normal.  Abdominal:     General: Abdomen is flat.  Genitourinary:    Comments: Pt has internal and external hemorrhoids. Slight bleeding controlled.  Skin:    General: Skin is warm.  Neurological:     General: No focal deficit present.     Mental Status: She is alert.     UC Treatments / Results  Labs (all labs ordered are listed, but only abnormal results are displayed) Labs Reviewed - No data to  display  EKG   Radiology No results found.  Procedures Procedures (including critical care time)  Medications Ordered in UC Medications - No data to display  Initial Impression / Assessment and Plan / UC Course  I have reviewed the triage vital signs and the nursing notes.  Pertinent labs & imaging results that were available during my care of the patient were reviewed by me and considered in my medical decision making (see chart for details).     Take a stool softer daily  You will need to talk with [facility] about follow up care  Cont to use the hydrocortisone cream with the lidocaine for pain  If bleeding cont go to ER Final Clinical Impressions(s) / UC Diagnoses   Final diagnoses:  Constipation, unspecified constipation type  Hemorrhoids, unspecified hemorrhoid type     Discharge Instructions      Take a stool softer daily  You will need to talk with [facility] about follow up care  Cont to use the hydrocortisone cream with the lidocaine for pain  If bleeding cont go to ER       ED Prescriptions     Medication Sig Dispense Auth. Provider   Lidocaine, Anorectal, 50 MG SUPP Place 50 mg rectally every 6 (six) hours. 14 suppository Marney Setting, NP      PDMP not reviewed this encounter.   Marney Setting, NP 05/07/21 1058

## 2021-08-05 ENCOUNTER — Encounter (HOSPITAL_COMMUNITY): Payer: Self-pay

## 2021-08-05 ENCOUNTER — Other Ambulatory Visit: Payer: Self-pay

## 2021-08-05 ENCOUNTER — Ambulatory Visit (HOSPITAL_COMMUNITY)
Admission: EM | Admit: 2021-08-05 | Discharge: 2021-08-05 | Disposition: A | Discharge status: Home / Self Care | Payer: 59 | Attending: Student | Admitting: Student

## 2021-08-05 DIAGNOSIS — R030 Elevated blood-pressure reading, without diagnosis of hypertension: Secondary | ICD-10-CM

## 2021-08-05 DIAGNOSIS — R112 Nausea with vomiting, unspecified: Secondary | ICD-10-CM | POA: Diagnosis not present

## 2021-08-05 MED ORDER — ONDANSETRON 8 MG PO TBDP: 8.0000 mg | ORAL_TABLET | Freq: Three times a day (TID) | ORAL | 0 refills | Status: DC | PRN

## 2021-08-05 NOTE — ED Provider Notes (Signed)
[facility]    CSN: [id number] Arrival date & time: 08/05/21  1724      History   Chief Complaint Chief Complaint  Patient presents with   Emesis   Headache    HPI [name] is a 41 y.o. female presenting with stomach pain and nausea with vomiting x2 days following eating rich food. Medical history as below. Vomiting 1-2x daily typically after eating. Generalized crampy abd pain. Tolerating food and fluids today. Occ diarrhea. Also with cough and congestion that started before the GI symptoms.   HPI  Past Medical History:  Diagnosis Date   Allergy    Anxiety    Cough 03/21/2016   no fever, states coughing up "snot colored cold stuff"-  has had this x 1.5 weeks and is getting much better- taking tylenol cold   Depression    never been treated , "probably got some "   GERD without esophagitis 05/16/2014   Headache(784.0)    S/P cesarean section 11/03/2014   Screening examination for STD (sexually transmitted disease) 12/11/2016   Tooth ache    right upper molar partially broke off    Patient Active Problem List   Diagnosis Date Noted   Vaginal discharge 01/02/2021   External hemorrhoid 01/02/2021   Wrist pain 01/02/2021   Arthralgia of left upper arm 08/07/2019   Skin mole 12/11/2016   Vitamin D deficiency 11/13/2016   Knee pain, bilateral 09/24/2016   ALLERGIC RHINITIS 03/19/2007    Past Surgical History:  Procedure Laterality Date   CESAREAN SECTION     CESAREAN SECTION WITH BILATERAL TUBAL LIGATION Bilateral 11/03/2014   Procedure: CESAREAN SECTION WITH BILATERAL TUBAL LIGATION;  Surgeon: Shelly Bombard, MD;  Location: [facility];  Service: Obstetrics;  Laterality: Bilateral;   CHOLECYSTECTOMY     CHOLECYSTECTOMY, LAPAROSCOPIC     DILATION AND CURETTAGE OF UTERUS     INCISIONAL HERNIA REPAIR N/A 03/27/2016   Procedure: LAPAROSCOPIC ASSISTED REPAIR OF INCARCERATED  INCISIONAL HERNIA;  Surgeon: Greer Pickerel, MD;  Location: [facility];  Service: General;   Laterality: N/A;   INSERTION OF MESH N/A 03/27/2016   Procedure: INSERTION OF MESH;  Surgeon: Greer Pickerel, MD;  Location: [facility];  Service: General;  Laterality: N/A;   THERAPEUTIC ABORTION     TUBAL LIGATION      OB History     Gravida  8   Para  3   Term  3   Preterm  0   AB  4   Living  3      SAB  1   IAB  3   Ectopic      Multiple  0   Live Births  2            Home Medications    Prior to Admission medications   Medication Sig Start Date End Date Taking? Authorizing Provider  hydrocortisone cream 1 % Apply to affected area 2 times daily for up to one week Patient not taking: Reported on 01/11/2021 01/07/21 01/07/22  Rehman, Areeg N, DO  ondansetron (ZOFRAN-ODT) 8 MG disintegrating tablet Take 1 tablet (8 mg total) by mouth every 8 (eight) hours as needed for nausea or vomiting. 08/05/21  Yes Hazel Sams, PA-C  diclofenac Sodium (VOLTAREN) 1 % GEL Apply 2 g topically 4 (four) times daily. 01/02/21   Rehman, Areeg N, DO  fluticasone (FLONASE) 50 MCG/ACT nasal spray Place 1 spray into both nostrils daily. Patient not taking: Reported on 01/11/2021  11/27/20   Pearson Forster, NP  Lidocaine, Anorectal, 50 MG SUPP Place 50 mg rectally every 6 (six) hours. 05/07/21   Marney Setting, NP  naproxen (NAPROSYN) 500 MG tablet Take 1 tablet (500 mg total) by mouth 2 (two) times daily. 11/27/20   Pearson Forster, NP  Prenat-Fe Poly-Methfol-FA-DHA (VITAFOL ULTRA) 29-0.6-0.4-200 MG CAPS Take 1 capsule by mouth daily before breakfast. 01/11/21   Shelly Bombard, MD  ipratropium (ATROVENT) 0.06 % nasal spray Place 2 sprays into both nostrils 4 (four) times daily. Patient not taking: Reported on 10/14/2016 09/05/16 12/30/19  Lysbeth Penner, FNP    Family History Family History  Problem Relation Age of Onset   Diabetes Father    Hypertension Father    Hyperlipidemia Father    Cancer Maternal Aunt        breast   Hypertension Paternal Grandmother    Stroke Paternal  Grandmother     Social History Social History   Tobacco Use   Smoking status: Former    Types: Cigarettes    Quit date: 07/15/2015    Years since quitting: 6.0   Smokeless tobacco: Never   Tobacco comments:    prior to preg  Substance Use Topics   Alcohol use: No    Alcohol/week: 0.0 standard drinks   Drug use: Yes    Types: Marijuana     Allergies   Levofloxacin   Review of Systems Review of Systems  Constitutional:  Negative for appetite change, chills and fever.  HENT:  Negative for congestion, ear pain, rhinorrhea, sinus pressure, sinus pain and sore throat.   Eyes:  Negative for redness and visual disturbance.  Respiratory:  Negative for cough, chest tightness, shortness of breath and wheezing.   Cardiovascular:  Negative for chest pain and palpitations.  Gastrointestinal:  Positive for abdominal pain, nausea and vomiting. Negative for constipation and diarrhea.  Genitourinary:  Negative for dysuria, frequency and urgency.  Musculoskeletal:  Negative for myalgias.  Neurological:  Negative for dizziness, weakness and headaches.  Psychiatric/Behavioral:  Negative for confusion.   All other systems reviewed and are negative.   Physical Exam Triage Vital Signs ED Triage Vitals  Enc Vitals Group     BP 08/05/21 1817 (!) 171/116     Pulse Rate 08/05/21 1817 76     Resp 08/05/21 1817 16     Temp 08/05/21 1817 98.7 F (37.1 C)     Temp Source 08/05/21 1817 Oral     SpO2 08/05/21 1817 98 %     Weight --      Height --      Head Circumference --      Peak Flow --      Pain Score 08/05/21 1823 7     Pain Loc --      Pain Edu? --      Excl. in [place]? --    No data found.  Updated Vital Signs BP (!) 171/116 (BP Location: Left Arm)    Pulse 76    Temp 98.7 F (37.1 C) (Oral)    Resp 16    LMP 07/29/2021 (Approximate)    SpO2 98%   Visual Acuity Right Eye Distance:   Left Eye Distance:   Bilateral Distance:    Right Eye Near:   Left Eye Near:    Bilateral  Near:     Physical Exam Vitals reviewed.  Constitutional:      General: She is not in acute distress.  Appearance: Normal appearance. She is not ill-appearing.  HENT:     Head: Normocephalic and atraumatic.     Mouth/Throat:     Mouth: Mucous membranes are moist.     Comments: Moist mucous membranes Eyes:     Extraocular Movements: Extraocular movements intact.     Pupils: Pupils are equal, round, and reactive to light.  Cardiovascular:     Rate and Rhythm: Normal rate and regular rhythm.     Heart sounds: Normal heart sounds.  Pulmonary:     Effort: Pulmonary effort is normal.     Breath sounds: Normal breath sounds. No wheezing, rhonchi or rales.  Abdominal:     General: Bowel sounds are normal. There is no distension.     Palpations: Abdomen is soft. There is no mass.     Tenderness: There is no abdominal tenderness. There is no right CVA tenderness, left CVA tenderness, guarding or rebound. Negative signs include Murphy's sign, Rovsing's sign and McBurney's sign.     Comments: No reproducible pain Comfortable throughout exam  Skin:    General: Skin is warm.     Capillary Refill: Capillary refill takes less than 2 seconds.     Comments: Good skin turgor  Neurological:     General: No focal deficit present.     Mental Status: She is alert and oriented to person, place, and time.  Psychiatric:        Mood and Affect: Mood normal.        Behavior: Behavior normal.     UC Treatments / Results  Labs (all labs ordered are listed, but only abnormal results are displayed) Labs Reviewed - No data to display  EKG   Radiology No results found.  Procedures Procedures (including critical care time)  Medications Ordered in UC Medications - No data to display  Initial Impression / Assessment and Plan / UC Course  I have reviewed the triage vital signs and the nursing notes.  Pertinent labs & imaging results that were available during my care of the patient were  reviewed by me and considered in my medical decision making (see chart for details).     This patient is a very pleasant 41 y.o. year old female presenting with possible food poisoning, resolving on its own, needs work note. LMP 07/29/2021, States she is not pregnant or breastfeeding.  Zofran ODT sent. Good hydration.  ED return precautions discussed. Patient verbalizes understanding and agreement.     Final Clinical Impressions(s) / UC Diagnoses   Final diagnoses:  Nausea and vomiting, unspecified vomiting type  Elevated blood pressure reading without diagnosis of hypertension     Discharge Instructions      -Take the Zofran (ondansetron) up to 3 times daily for nausea and vomiting. Dissolve one pill under your tongue or between your teeth and your cheek. -Drink plenty of fluids -Please check your blood pressure at home or at the pharmacy. If this continues to be >140/90, follow-up with your primary care provider for further blood pressure management/ medication titration. If you develop chest pain, shortness of breath, vision changes, the worst headache of your life- head straight to the ED or call 911.      ED Prescriptions     Medication Sig Dispense Auth. Provider   ondansetron (ZOFRAN-ODT) 8 MG disintegrating tablet Take 1 tablet (8 mg total) by mouth every 8 (eight) hours as needed for nausea or vomiting. 20 tablet Le[place] Reader      PDMP not  reviewed this encounter.   Hazel Sams, PA-C 08/05/21 [phone]

## 2021-08-05 NOTE — Discharge Instructions (Addendum)
-  Take the Zofran (ondansetron) up to 3 times daily for nausea and vomiting. Dissolve one pill under your tongue or between your teeth and your cheek. -Drink plenty of fluids -Please check your blood pressure at home or at the pharmacy. If this continues to be >140/90, follow-up with your primary care provider for further blood pressure management/ medication titration. If you develop chest pain, shortness of breath, vision changes, the worst headache of your life- head straight to the ED or call 911.

## 2021-08-05 NOTE — ED Triage Notes (Signed)
Pt presents with c/o stomach pain, vomiting x 2 days and states she thinks she has food poisoning.

## 2021-09-11 ENCOUNTER — Encounter (HOSPITAL_COMMUNITY): Payer: Self-pay | Admitting: *Deleted

## 2021-09-11 ENCOUNTER — Other Ambulatory Visit: Payer: Self-pay

## 2021-09-11 ENCOUNTER — Ambulatory Visit (HOSPITAL_COMMUNITY)
Admission: EM | Admit: 2021-09-11 | Discharge: 2021-09-11 | Disposition: A | Discharge status: Home / Self Care | Payer: 59 | Attending: Family Medicine | Admitting: Family Medicine

## 2021-09-11 DIAGNOSIS — J0110 Acute frontal sinusitis, unspecified: Secondary | ICD-10-CM | POA: Diagnosis not present

## 2021-09-11 DIAGNOSIS — A084 Viral intestinal infection, unspecified: Secondary | ICD-10-CM

## 2021-09-11 MED ORDER — FLUTICASONE PROPIONATE 50 MCG/ACT NA SUSP: 1.0000 | Freq: Every day | NASAL | 2 refills | Status: DC

## 2021-09-11 MED ORDER — ONDANSETRON 4 MG PO TBDP: 4.0000 mg | ORAL_TABLET | Freq: Three times a day (TID) | ORAL | 0 refills | Status: DC | PRN

## 2021-09-11 MED ORDER — AMOXICILLIN-POT CLAVULANATE 875-125 MG PO TABS: 1.0000 | ORAL_TABLET | Freq: Two times a day (BID) | ORAL | 0 refills | Status: DC

## 2021-09-11 NOTE — ED Triage Notes (Signed)
Pt reports sinus infection for last 2 weeks . Pt reports stomach virus started this morning. ?

## 2021-09-11 NOTE — Discharge Instructions (Signed)
If you find that you are unable to Also, you should return to the hospital if you experience persistent fevers for greater than 1-2 more days, increasing abdominal pain that persists despite medications, persistent diarrhea, dizziness, syncope (fainting), or for any other concerns you may deem worrisome. ? ?

## 2021-09-12 NOTE — ED Provider Notes (Signed)
?[facility] ? ? ?[id number] ?09/11/21 Arrival Time: 7829 ? ?ASSESSMENT & PLAN: ? ?1. Acute non-recurrent frontal sinusitis   ?2. Viral gastroenteritis   ? ?Tolerating PO fluids. ? ?Begin: ?Meds ordered this encounter  ?Medications  ? fluticasone (FLONASE) 50 MCG/ACT nasal spray  ?  Sig: Place 1 spray into both nostrils daily.  ?  Dispense:  18.2 mL  ?  Refill:  2  ? amoxicillin-clavulanate (AUGMENTIN) 875-125 MG tablet  ?  Sig: Take 1 tablet by mouth every 12 (twelve) hours.  ?  Dispense:  20 tablet  ?  Refill:  0  ? ondansetron (ZOFRAN-ODT) 4 MG disintegrating tablet  ?  Sig: Take 1 tablet (4 mg total) by mouth every 8 (eight) hours as needed for nausea or vomiting.  ?  Dispense:  15 tablet  ?  Refill:  0  ? ?Discussed typical duration of symptoms. ?Discussed possibility of antibiotic making diarrhea worse; voices understanding. ?OTC symptom care as needed. ?Ensure adequate fluid intake and rest. ? ? Follow-up Information   ? ? [facility], [facility], [facility].   ?Specialty: Urgent Care ?Why: As needed. ?Contact information: ?[address] ?[place] [address] ?[phone] ? ?  ?  ? ?  ?  ? ?  ? ? ?Reviewed expectations re: course of current medical issues. Questions answered. ?Outlined signs and symptoms indicating need for more acute intervention. ?Patient verbalized understanding. ?After Visit Summary given. ? ? ?SUBJECTIVE: ?History from: patient. ? ?[name] is a 41 y.o. female who presents with complaint of nasal congestion, post-nasal drainage, and sinus pain. Onset gradual,  2 w ago . Respiratory symptoms: none. Fever: absent. Overall normal PO intake without n/v. OTC treatment: decongestants without much relief. Seasonal allergies: mild to moderate, esp over past couple of weeks. ?History of frequent sinus infections: she reports so. No specific aggravating or alleviating factors reported. ? ?Also reports "stomach virus"; abrupt onset; with n/v/d, non-bloody; starting  this morning. Mild abd cramping. ?Patient's last menstrual period was 09/04/2021. ? ?Social History  ? ?Tobacco Use  ?Smoking Status Former  ? Types: Cigarettes  ? Quit date: 07/15/2015  ? Years since quitting: 6.1  ?Smokeless Tobacco Never  ?Tobacco Comments  ? prior to preg  ? ? ?OBJECTIVE: ? ?Vitals:  ? 09/11/21 1843  ?BP: 117/76  ?Pulse: 84  ?Resp: 18  ?Temp: 98.1 ?F (36.7 ?C)  ?SpO2: 98%  ?  ? ?General appearance: alert; no distress ?HEENT: nasal congestion; clear runny nose; throat irritation secondary to post-nasal drainage; frontal sinus tenderness to palpation; turbinates boggy ?Neck: supple without LAD; trachea midline ?Lungs: unlabored respirations, symmetrical air entry; cough: absent; no respiratory distress ?Abd: soft; NT; ND; no guarding ?Skin: warm and dry ?Psychological: alert and cooperative; normal mood and affect ? ?Allergies  ?Allergen Reactions  ? Levofloxacin Itching  ? ? ?Past Medical History:  ?Diagnosis Date  ? Allergy   ? Anxiety   ? Cough 03/21/2016  ? no fever, states coughing up "snot colored cold stuff"-  has had this x 1.5 weeks and is getting much better- taking tylenol cold  ? Depression   ? never been treated , "probably got some "  ? GERD without esophagitis 05/16/2014  ? Headache(784.0)   ? S/P cesarean section 11/03/2014  ? Screening examination for STD (sexually transmitted disease) 12/11/2016  ? Tooth ache   ? right upper molar partially broke off  ? ?Family History  ?Problem Relation Age of Onset  ? Diabetes  Father   ? Hypertension Father   ? Hyperlipidemia Father   ? Cancer Maternal Aunt   ?     breast  ? Hypertension Paternal Grandmother   ? Stroke Paternal Grandmother   ? ?Social History  ? ?Socioeconomic History  ? Marital status: Single  ?  Spouse name: Not on file  ? Number of children: Not on file  ? Years of education: Not on file  ? Highest education level: Not on file  ?Occupational History  ? Not on file  ?Tobacco Use  ? Smoking status: Former  ?  Types: Cigarettes  ?   Quit date: 07/15/2015  ?  Years since quitting: 6.1  ? Smokeless tobacco: Never  ? Tobacco comments:  ?  prior to preg  ?Substance and Sexual Activity  ? Alcohol use: No  ?  Alcohol/week: 0.0 standard drinks  ? Drug use: Yes  ?  Types: Marijuana  ? Sexual activity: Yes  ?  Birth control/protection: Surgical  ?  Comment: BTL  ?Other Topics Concern  ? Not on file  ?Social History Narrative  ? Not on file  ? ?Social Determinants of Health  ? ?Financial Resource Strain: Not on file  ?Food Insecurity: Not on file  ?Transportation Needs: Not on file  ?Physical Activity: Not on file  ?Stress: Not on file  ?Social Connections: Not on file  ?Intimate Partner Violence: Not on file  ? ? ? ? ? ? ? ? ? ?  ?Vanessa Kick, MD ?09/12/21 1052 ? ?

## 2021-10-02 ENCOUNTER — Encounter (HOSPITAL_COMMUNITY): Payer: Self-pay | Admitting: Emergency Medicine

## 2021-10-02 ENCOUNTER — Ambulatory Visit (HOSPITAL_COMMUNITY)
Admission: EM | Admit: 2021-10-02 | Discharge: 2021-10-02 | Disposition: A | Discharge status: Home / Self Care | Payer: 59 | Attending: Family Medicine | Admitting: Family Medicine

## 2021-10-02 ENCOUNTER — Other Ambulatory Visit: Payer: Self-pay

## 2021-10-02 DIAGNOSIS — J0110 Acute frontal sinusitis, unspecified: Secondary | ICD-10-CM

## 2021-10-02 MED ORDER — LEVOCETIRIZINE DIHYDROCHLORIDE 5 MG PO TABS: 5.0000 mg | ORAL_TABLET | Freq: Every evening | ORAL | 0 refills | Status: DC

## 2021-10-02 MED ORDER — AMOXICILLIN 875 MG PO TABS: 875.0000 mg | ORAL_TABLET | Freq: Two times a day (BID) | ORAL | 0 refills | Status: DC

## 2021-10-02 NOTE — ED Provider Notes (Signed)
?[facility] ? ? ? ?CSN: [id number] ?Arrival date & time: 10/02/21  1506 ? ? ?  ? ?History   ?Chief Complaint ?Chief Complaint  ?Patient presents with  ? cough  ? Fever  ? Sore Throat  ? Nasal Congestion  ? Fatigue  ? ? ?HPI ?[name] is a 41 y.o. female.  ? ?HPI ?Patient presents today for evaluation of ongoing sinus symptoms.  Patient was seen here in clinic over 3 weeks ago however is unclear why she has not started the antibiotics that were prescribed during that visit.  She reports ongoing nasal congestion, sore throat fatigue and she has had fever.  Reports taking Tylenol and TheraFlu for management of fever and symptoms without relief.  Last fever was yesterday.  Patient is afebrile at present.  Denies any difficulty breathing, wheezing, or chest pain. ? ?Past Medical History:  ?Diagnosis Date  ? Allergy   ? Anxiety   ? Cough 03/21/2016  ? no fever, states coughing up "snot colored cold stuff"-  has had this x 1.5 weeks and is getting much better- taking tylenol cold  ? Depression   ? never been treated , "probably got some "  ? GERD without esophagitis 05/16/2014  ? Headache(784.0)   ? S/P cesarean section 11/03/2014  ? Screening examination for STD (sexually transmitted disease) 12/11/2016  ? Tooth ache   ? right upper molar partially broke off  ? ? ?Patient Active Problem List  ? Diagnosis Date Noted  ? Vaginal discharge 01/02/2021  ? External hemorrhoid 01/02/2021  ? Wrist pain 01/02/2021  ? Arthralgia of left upper arm 08/07/2019  ? Skin mole 12/11/2016  ? Vitamin D deficiency 11/13/2016  ? Knee pain, bilateral 09/24/2016  ? ALLERGIC RHINITIS 03/19/2007  ? ? ?Past Surgical History:  ?Procedure Laterality Date  ? CESAREAN SECTION    ? CESAREAN SECTION WITH BILATERAL TUBAL LIGATION Bilateral 11/03/2014  ? Procedure: CESAREAN SECTION WITH BILATERAL TUBAL LIGATION;  Surgeon: Shelly Bombard, MD;  Location: [facility];  Service: Obstetrics;  Laterality: Bilateral;  ? CHOLECYSTECTOMY    ? CHOLECYSTECTOMY,  LAPAROSCOPIC    ? DILATION AND CURETTAGE OF UTERUS    ? INCISIONAL HERNIA REPAIR N/A 03/27/2016  ? Procedure: LAPAROSCOPIC ASSISTED REPAIR OF INCARCERATED  INCISIONAL HERNIA;  Surgeon: Greer Pickerel, MD;  Location: [facility];  Service: General;  Laterality: N/A;  ? INSERTION OF MESH N/A 03/27/2016  ? Procedure: INSERTION OF MESH;  Surgeon: Greer Pickerel, MD;  Location: [facility];  Service: General;  Laterality: N/A;  ? THERAPEUTIC ABORTION    ? TUBAL LIGATION    ? ? ?OB History   ? ? Gravida  ?8  ? Para  ?3  ? Term  ?3  ? Preterm  ?0  ? AB  ?4  ? Living  ?3  ?  ? ? SAB  ?1  ? IAB  ?3  ? Ectopic  ?   ? Multiple  ?0  ? Live Births  ?2  ?   ?  ?  ? ? ? ?Home Medications   ? ?Prior to Admission medications   ?Medication Sig Start Date End Date Taking? Authorizing Provider  ?hydrocortisone cream 1 % Apply to affected area 2 times daily for up to one week ?Patient not taking: Reported on 01/11/2021 01/07/21 01/07/22  Rehman, Areeg N, DO  ?amoxicillin-clavulanate (AUGMENTIN) 875-125 MG tablet Take 1 tablet by mouth every 12 (twelve) hours. 09/11/21   Vanessa Kick, MD  ?diclofenac Sodium (VOLTAREN)  1 % GEL Apply 2 g topically 4 (four) times daily. 01/02/21   Rehman, Areeg N, DO  ?fluticasone (FLONASE) 50 MCG/ACT nasal spray Place 1 spray into both nostrils daily. 09/11/21   Vanessa Kick, MD  ?Lidocaine, Anorectal, 50 MG SUPP Place 50 mg rectally every 6 (six) hours. 05/07/21   Marney Setting, NP  ?naproxen (NAPROSYN) 500 MG tablet Take 1 tablet (500 mg total) by mouth 2 (two) times daily. 11/27/20   Pearson Forster, NP  ?ondansetron (ZOFRAN-ODT) 4 MG disintegrating tablet Take 1 tablet (4 mg total) by mouth every 8 (eight) hours as needed for nausea or vomiting. 09/11/21   Vanessa Kick, MD  ?Prenat-Fe Poly-Methfol-FA-DHA (VITAFOL ULTRA) 29-0.6-0.4-200 MG CAPS Take 1 capsule by mouth daily before breakfast. 01/11/21   Shelly Bombard, MD  ?ipratropium (ATROVENT) 0.06 % nasal spray Place 2 sprays into both nostrils 4 (four) times  daily. ?Patient not taking: Reported on 10/14/2016 09/05/16 12/30/19  Lysbeth Penner, FNP  ? ? ?Family History ?Family History  ?Problem Relation Age of Onset  ? Diabetes Father   ? Hypertension Father   ? Hyperlipidemia Father   ? Cancer Maternal Aunt   ?     breast  ? Hypertension Paternal Grandmother   ? Stroke Paternal Grandmother   ? ? ?Social History ?Social History  ? ?Tobacco Use  ? Smoking status: Former  ?  Types: Cigarettes  ?  Quit date: 07/15/2015  ?  Years since quitting: 6.2  ? Smokeless tobacco: Never  ? Tobacco comments:  ?  prior to preg  ?Substance Use Topics  ? Alcohol use: No  ?  Alcohol/week: 0.0 standard drinks  ? Drug use: Yes  ?  Types: Marijuana  ? ? ? ?Allergies   ?Levofloxacin ? ? ?Review of Systems ?Review of Systems ?Pertinent negatives listed in HPI  ?Physical Exam ?Triage Vital Signs ?ED Triage Vitals  ?Enc Vitals Group  ?   BP 10/02/21 1617 122/77  ?   Pulse Rate 10/02/21 1617 83  ?   Resp 10/02/21 1617 16  ?   Temp 10/02/21 1617 98.9 ?F (37.2 ?C)  ?   Temp Source 10/02/21 1617 Oral  ?   SpO2 10/02/21 1617 96 %  ?   Weight 10/02/21 1615 197 lb 15.6 oz (89.8 kg)  ?   Height 10/02/21 1615 '5\' 3"'$  (1.6 m)  ?   Head Circumference --   ?   Peak Flow --   ?   Pain Score 10/02/21 1615 0  ?   Pain Loc --   ?   Pain Edu? --   ?   Excl. in [place]? --   ? ?No data found. ? ?Updated Vital Signs ?BP 122/77 (BP Location: Right Arm)   Pulse 83   Temp 98.9 ?F (37.2 ?C) (Oral)   Resp 16   Ht '5\' 3"'$  (1.6 m)   Wt 197 lb 15.6 oz (89.8 kg)   LMP 09/04/2021   SpO2 96%   BMI 35.07 kg/m?  ? ?Visual Acuity ?Right Eye Distance:   ?Left Eye Distance:   ?Bilateral Distance:   ? ?Right Eye Near:   ?Left Eye Near:    ?Bilateral Near:    ? ?Physical Exam ? ?General Appearance:    Alert, cooperative, no distress  ?HENT:   Normocephalic, ears normal, nares mucosal edema with congestion, rhinorrhea, oropharynx patent without erythema or exudate   ?Eyes:    PERRL, conjunctiva/corneas clear, EOM's intact       ?  Lungs:      Clear to auscultation bilaterally, respirations unlabored  ?Heart:    Regular rate and rhythm  ?Neurologic:   Awake, alert, oriented x 3. No apparent focal neurological           defect.   ?  ? ?UC Treatments / Results  ?Labs ?(all labs ordered are listed, but only abnormal results are displayed) ?Labs Reviewed - No data to display ? ?EKG ? ? ?Radiology ?No results found. ? ?Procedures ?Procedures (including critical care time) ? ?Medications Ordered in UC ?Medications - No data to display ? ?Initial Impression / Assessment and Plan / UC Course  ?I have reviewed the triage vital signs and the nursing notes. ? ?Pertinent labs & imaging results that were available during my care of the patient were reviewed by me and considered in my medical decision making (see chart for details). ? ?  ?Acute nonrecurrent sinusitis ?Treatment with amoxicillin 875 twice daily for 7 days.   ?Start levocetirizine 5 mg daily for preventative seasonal allergy and rhinitis symptoms. Return precautions given if symptoms worsen or do not improve.  Work note provided. ?Final Clinical Impressions(s) / UC Diagnoses  ? ?Final diagnoses:  ?Acute non-recurrent frontal sinusitis  ? ?Discharge Instructions   ?None ?  ? ?ED Prescriptions   ? ? Medication Sig Dispense Auth. Provider  ? amoxicillin (AMOXIL) 875 MG tablet Take 1 tablet (875 mg total) by mouth 2 (two) times daily. 14 tablet Scot Jun, FNP  ? levocetirizine (XYZAL) 5 MG tablet Take 1 tablet (5 mg total) by mouth every evening. 30 tablet Scot Jun, FNP  ? ?  ? ?PDMP not reviewed this encounter. ?  ?Scot Jun, FNP ?10/02/21 1708 ? ?

## 2021-10-02 NOTE — ED Triage Notes (Signed)
Pt reports cough, nasal congestion, sore throat, fatigue and fever x 1.5 week. States took Theraflu with little relief. ?

## 2021-10-07 ENCOUNTER — Ambulatory Visit (HOSPITAL_COMMUNITY)
Admission: EM | Admit: 2021-10-07 | Discharge: 2021-10-07 | Disposition: A | Discharge status: Home / Self Care | Payer: 59 | Attending: Family Medicine | Admitting: Family Medicine

## 2021-10-07 ENCOUNTER — Encounter (HOSPITAL_COMMUNITY): Payer: Self-pay

## 2021-10-07 DIAGNOSIS — K529 Noninfective gastroenteritis and colitis, unspecified: Secondary | ICD-10-CM | POA: Diagnosis not present

## 2021-10-07 MED ORDER — KETOROLAC TROMETHAMINE 30 MG/ML IJ SOLN
INTRAMUSCULAR | Status: AC
  Filled 2021-10-07: qty 1

## 2021-10-07 MED ORDER — ONDANSETRON 4 MG PO TBDP: 4.0000 mg | ORAL_TABLET | Freq: Three times a day (TID) | ORAL | 0 refills | Status: DC | PRN

## 2021-10-07 MED ORDER — KETOROLAC TROMETHAMINE 30 MG/ML IJ SOLN
30.0000 mg | Freq: Once | INTRAMUSCULAR | Status: AC
  Administered 2021-10-07: 30 mg via INTRAMUSCULAR

## 2021-10-07 NOTE — Discharge Instructions (Addendum)
Ondansetron dissolved in the mouth every 8 hours as needed for nausea or vomiting. ?Clear liquids and bland things to eat.  ? ?Tylenol as needed for pain ?

## 2021-10-07 NOTE — ED Provider Notes (Signed)
?[place] ? ? ? ?CSN: [id number] ?Arrival date & time: 10/07/21  1915 ? ? ?  ? ?History   ?Chief Complaint ?Chief Complaint  ?Patient presents with  ? Emesis  ? Nausea  ? Headache  ? Fatigue  ? ? ?HPI ?[name] is a 41 y.o. female.  ? ? ?Emesis ?Associated symptoms: headaches   ?Headache ?Associated symptoms: vomiting   ?Here for nausea and vomiting that was first noted this morning.  She is thrown up about 4 times since she woke up this morning.  No fever or chills.  No diarrhea so far.  She did feel nauseated last night.  She had some abdominal cramps earlier when she was throwing up, but none now.  Last menstrual period was in the last week.  Her tubes are tied, and she states she has not been exposed to anyone who would get her pregnant ? ?She is finishing antibiotics for a sinus infection ? ?Past Medical History:  ?Diagnosis Date  ? Allergy   ? Anxiety   ? Cough 03/21/2016  ? no fever, states coughing up "snot colored cold stuff"-  has had this x 1.5 weeks and is getting much better- taking tylenol cold  ? Depression   ? never been treated , "probably got some "  ? GERD without esophagitis 05/16/2014  ? Headache(784.0)   ? S/P cesarean section 11/03/2014  ? Screening examination for STD (sexually transmitted disease) 12/11/2016  ? Tooth ache   ? right upper molar partially broke off  ? ? ?Patient Active Problem List  ? Diagnosis Date Noted  ? Vaginal discharge 01/02/2021  ? External hemorrhoid 01/02/2021  ? Wrist pain 01/02/2021  ? Arthralgia of left upper arm 08/07/2019  ? Skin mole 12/11/2016  ? Vitamin D deficiency 11/13/2016  ? Knee pain, bilateral 09/24/2016  ? ALLERGIC RHINITIS 03/19/2007  ? ? ?Past Surgical History:  ?Procedure Laterality Date  ? CESAREAN SECTION    ? CESAREAN SECTION WITH BILATERAL TUBAL LIGATION Bilateral 11/03/2014  ? Procedure: CESAREAN SECTION WITH BILATERAL TUBAL LIGATION;  Surgeon: Shelly Bombard, MD;  Location: [facility];  Service: Obstetrics;  Laterality: Bilateral;  ?  CHOLECYSTECTOMY    ? CHOLECYSTECTOMY, LAPAROSCOPIC    ? DILATION AND CURETTAGE OF UTERUS    ? INCISIONAL HERNIA REPAIR N/A 03/27/2016  ? Procedure: LAPAROSCOPIC ASSISTED REPAIR OF INCARCERATED  INCISIONAL HERNIA;  Surgeon: Greer Pickerel, MD;  Location: [facility];  Service: General;  Laterality: N/A;  ? INSERTION OF MESH N/A 03/27/2016  ? Procedure: INSERTION OF MESH;  Surgeon: Greer Pickerel, MD;  Location: [facility];  Service: General;  Laterality: N/A;  ? THERAPEUTIC ABORTION    ? TUBAL LIGATION    ? ? ?OB History   ? ? Gravida  ?8  ? Para  ?3  ? Term  ?3  ? Preterm  ?0  ? AB  ?4  ? Living  ?3  ?  ? ? SAB  ?1  ? IAB  ?3  ? Ectopic  ?   ? Multiple  ?0  ? Live Births  ?2  ?   ?  ?  ? ? ? ?Home Medications   ? ?Prior to Admission medications   ?Medication Sig Start Date End Date Taking? Authorizing Provider  ?hydrocortisone cream 1 % Apply to affected area 2 times daily for up to one week ?Patient not taking: Reported on 01/11/2021 01/07/21 01/07/22  Rehman, Areeg N, DO  ?ondansetron (ZOFRAN-ODT) 4 MG disintegrating tablet  Take 1 tablet (4 mg total) by mouth every 8 (eight) hours as needed for nausea or vomiting. 10/07/21  Yes Jary Louvier, Gwenlyn Perking, MD  ?amoxicillin (AMOXIL) 875 MG tablet Take 1 tablet (875 mg total) by mouth 2 (two) times daily. 10/02/21   Scot Jun, FNP  ?diclofenac Sodium (VOLTAREN) 1 % GEL Apply 2 g topically 4 (four) times daily. 01/02/21   Rehman, Areeg N, DO  ?fluticasone (FLONASE) 50 MCG/ACT nasal spray Place 1 spray into both nostrils daily. 09/11/21   Vanessa Kick, MD  ?levocetirizine (XYZAL) 5 MG tablet Take 1 tablet (5 mg total) by mouth every evening. 10/02/21   Scot Jun, FNP  ?Lidocaine, Anorectal, 50 MG SUPP Place 50 mg rectally every 6 (six) hours. 05/07/21   Marney Setting, NP  ?naproxen (NAPROSYN) 500 MG tablet Take 1 tablet (500 mg total) by mouth 2 (two) times daily. 11/27/20   Pearson Forster, NP  ?Prenat-Fe Poly-Methfol-FA-DHA (VITAFOL ULTRA) 29-0.6-0.4-200 MG CAPS Take 1 capsule  by mouth daily before breakfast. 01/11/21   Shelly Bombard, MD  ?ipratropium (ATROVENT) 0.06 % nasal spray Place 2 sprays into both nostrils 4 (four) times daily. ?Patient not taking: Reported on 10/14/2016 09/05/16 12/30/19  Lysbeth Penner, FNP  ? ? ?Family History ?Family History  ?Problem Relation Age of Onset  ? Diabetes Father   ? Hypertension Father   ? Hyperlipidemia Father   ? Cancer Maternal Aunt   ?     breast  ? Hypertension Paternal Grandmother   ? Stroke Paternal Grandmother   ? ? ?Social History ?Social History  ? ?Tobacco Use  ? Smoking status: Former  ?  Types: Cigarettes  ?  Quit date: 07/15/2015  ?  Years since quitting: 6.2  ? Smokeless tobacco: Never  ? Tobacco comments:  ?  prior to preg  ?Substance Use Topics  ? Alcohol use: No  ?  Alcohol/week: 0.0 standard drinks  ? Drug use: Yes  ?  Types: Marijuana  ? ? ? ?Allergies   ?Levofloxacin ? ? ?Review of Systems ?Review of Systems  ?Gastrointestinal:  Positive for vomiting.  ?Neurological:  Positive for headaches.  ? ? ?Physical Exam ?Triage Vital Signs ?ED Triage Vitals  ?Enc Vitals Group  ?   BP 10/07/21 1948 (!) 164/97  ?   Pulse Rate 10/07/21 1948 83  ?   Resp 10/07/21 1948 18  ?   Temp 10/07/21 1948 98.9 ?F (37.2 ?C)  ?   Temp Source 10/07/21 1948 Oral  ?   SpO2 10/07/21 1948 98 %  ?   Weight --   ?   Height --   ?   Head Circumference --   ?   Peak Flow --   ?   Pain Score 10/07/21 1952 7  ?   Pain Loc --   ?   Pain Edu? --   ?   Excl. in [place]? --   ? ?No data found. ? ?Updated Vital Signs ?BP (!) 164/97 (BP Location: Right Arm)   Pulse 83   Temp 98.9 ?F (37.2 ?C) (Oral)   Resp 18   LMP 09/30/2021   SpO2 98%  ? ?Visual Acuity ?Right Eye Distance:   ?Left Eye Distance:   ?Bilateral Distance:   ? ?Right Eye Near:   ?Left Eye Near:    ?Bilateral Near:    ? ?Physical Exam ?Vitals reviewed.  ?Constitutional:   ?   General: She is not in acute distress. ?  Appearance: She is not toxic-appearing.  ?HENT:  ?   Nose: Nose normal.  ?   Mouth/Throat:   ?   Mouth: Mucous membranes are moist.  ?   Pharynx: No oropharyngeal exudate or posterior oropharyngeal erythema.  ?Eyes:  ?   Extraocular Movements: Extraocular movements intact.  ?   Conjunctiva/sclera: Conjunctivae normal.  ?   Pupils: Pupils are equal, round, and reactive to light.  ?Cardiovascular:  ?   Rate and Rhythm: Normal rate and regular rhythm.  ?   Heart sounds: No murmur heard. ?Pulmonary:  ?   Effort: Pulmonary effort is normal. No respiratory distress.  ?   Breath sounds: No wheezing, rhonchi or rales.  ?Chest:  ?   Chest wall: No tenderness.  ?Abdominal:  ?   General: There is no distension.  ?   Palpations: Abdomen is soft. There is no mass.  ?   Tenderness: There is no abdominal tenderness. There is no guarding.  ?Musculoskeletal:  ?   Cervical back: Neck supple.  ?Lymphadenopathy:  ?   Cervical: No cervical adenopathy.  ?Skin: ?   Capillary Refill: Capillary refill takes less than 2 seconds.  ?   Coloration: Skin is not jaundiced or pale.  ?Neurological:  ?   General: No focal deficit present.  ?   Mental Status: She is alert and oriented to person, place, and time.  ?Psychiatric:     ?   Behavior: Behavior normal.  ? ? ? ?UC Treatments / Results  ?Labs ?(all labs ordered are listed, but only abnormal results are displayed) ?Labs Reviewed - No data to display ? ?EKG ? ? ?Radiology ?No results found. ? ?Procedures ?Procedures (including critical care time) ? ?Medications Ordered in UC ?Medications  ?ketorolac (TORADOL) 30 MG/ML injection 30 mg (has no administration in time range)  ? ? ?Initial Impression / Assessment and Plan / UC Course  ?I have reviewed the triage vital signs and the nursing notes. ? ?Pertinent labs & imaging results that were available during my care of the patient were reviewed by me and considered in my medical decision making (see chart for details). ? ?  ? ?Give her some Toradol here for the headache, and Zofran will be sent in for the nausea. ?Final Clinical  Impressions(s) / UC Diagnoses  ? ?Final diagnoses:  ?Gastroenteritis  ? ? ? ?Discharge Instructions   ? ?  ?Ondansetron dissolved in the mouth every 8 hours as needed for nausea or vomiting. ?Clear liquids and bland

## 2021-10-07 NOTE — ED Triage Notes (Signed)
Pt presents with nausea, vomiting, fatigue, and headache since waking up this morning. ?

## 2021-11-20 ENCOUNTER — Other Ambulatory Visit: Payer: Self-pay

## 2021-11-20 ENCOUNTER — Ambulatory Visit (HOSPITAL_COMMUNITY)
Admission: EM | Admit: 2021-11-20 | Discharge: 2021-11-20 | Disposition: A | Discharge status: Home / Self Care | Payer: 59 | Attending: Nurse Practitioner | Admitting: Nurse Practitioner

## 2021-11-20 ENCOUNTER — Encounter (HOSPITAL_COMMUNITY): Payer: Self-pay | Admitting: Emergency Medicine

## 2021-11-20 DIAGNOSIS — R197 Diarrhea, unspecified: Secondary | ICD-10-CM

## 2021-11-20 DIAGNOSIS — M25532 Pain in left wrist: Secondary | ICD-10-CM | POA: Diagnosis not present

## 2021-11-20 DIAGNOSIS — M25531 Pain in right wrist: Secondary | ICD-10-CM | POA: Diagnosis not present

## 2021-11-20 MED ORDER — KETOROLAC TROMETHAMINE 30 MG/ML IJ SOLN
30.0000 mg | Freq: Once | INTRAMUSCULAR | Status: AC
  Administered 2021-11-20: 30 mg via INTRAMUSCULAR

## 2021-11-20 MED ORDER — KETOROLAC TROMETHAMINE 30 MG/ML IJ SOLN
INTRAMUSCULAR | Status: AC
  Filled 2021-11-20: qty 1

## 2021-11-20 NOTE — ED Provider Notes (Signed)
?[place] ? ? ? ?CSN: [id number] ?Arrival date & time: 11/20/21  1742 ? ? ?  ? ?History   ?Chief Complaint ?Chief Complaint  ?Patient presents with  ? URI  ? ? ?HPI ?[name] is a 41 y.o. female.  ? ?Patient presents with 2 concerns today.  First, she is wondering if she is lactose intolerant.  Reports last night, she ate tacos with cheese and a cheeseburger.  This morning, she woke up with lower abdominal cramping and diarrhea.  She reports she is had multiple episodes of nonbloody diarrhea today.  She denies any fever, vomiting, nausea. ? ?Currently, she is concerned about her bilateral wrist pain.  She reports she has pain in her wrists that radiates up to her elbows.  She has tingling in her fingers, especially the first and second digit bilaterally.  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  She has not been able to work today because of pain. ? ? ?Past Medical History:  ?Diagnosis Date  ? Allergy   ? Anxiety   ? Cough 03/21/2016  ? no fever, states coughing up "snot colored cold stuff"-  has had this x 1.5 weeks and is getting much better- taking tylenol cold  ? Depression   ? never been treated , "probably got some "  ? GERD without esophagitis 05/16/2014  ? Headache(784.0)   ? S/P cesarean section 11/03/2014  ? Screening examination for STD (sexually transmitted disease) 12/11/2016  ? Tooth ache   ? right upper molar partially broke off  ? ? ?Patient Active Problem List  ? Diagnosis Date Noted  ? Vaginal discharge 01/02/2021  ? External hemorrhoid 01/02/2021  ? Wrist pain 01/02/2021  ? Arthralgia of left upper arm 08/07/2019  ? Skin mole 12/11/2016  ? Vitamin D deficiency 11/13/2016  ? Knee pain, bilateral 09/24/2016  ? ALLERGIC RHINITIS 03/19/2007  ? ? ?Past Surgical History:  ?Procedure Laterality Date  ? CESAREAN SECTION    ? CESAREAN SECTION WITH BILATERAL TUBAL LIGATION Bilateral 11/03/2014  ? Procedure: CESAREAN SECTION WITH  BILATERAL TUBAL LIGATION;  Surgeon: Shelly Bombard, MD;  Location: [facility];  Service: Obstetrics;  Laterality: Bilateral;  ? CHOLECYSTECTOMY    ? CHOLECYSTECTOMY, LAPAROSCOPIC    ? DILATION AND CURETTAGE OF UTERUS    ? INCISIONAL HERNIA REPAIR N/A 03/27/2016  ? Procedure: LAPAROSCOPIC ASSISTED REPAIR OF INCARCERATED  INCISIONAL HERNIA;  Surgeon: Greer Pickerel, MD;  Location: [facility];  Service: General;  Laterality: N/A;  ? INSERTION OF MESH N/A 03/27/2016  ? Procedure: INSERTION OF MESH;  Surgeon: Greer Pickerel, MD;  Location: [facility];  Service: General;  Laterality: N/A;  ? THERAPEUTIC ABORTION    ? TUBAL LIGATION    ? ? ?OB History   ? ? Gravida  ?8  ? Para  ?3  ? Term  ?3  ? Preterm  ?0  ? AB  ?4  ? Living  ?3  ?  ? ? SAB  ?1  ? IAB  ?3  ? Ectopic  ?   ? Multiple  ?0  ? Live Births  ?2  ?   ?  ?  ? ? ? ?Home Medications   ? ?Prior to Admission medications   ?Medication Sig Start Date End Date Taking? Authorizing Provider  ?hydrocortisone cream 1 % Apply to affected area 2 times daily for up to one week ?Patient not taking: Reported  on 01/11/2021 01/07/21 01/07/22  Rehman, Areeg N, DO  ?amoxicillin (AMOXIL) 875 MG tablet Take 1 tablet (875 mg total) by mouth 2 (two) times daily. ?Patient not taking: Reported on 11/20/2021 10/02/21   Scot Jun, FNP  ?diclofenac Sodium (VOLTAREN) 1 % GEL Apply 2 g topically 4 (four) times daily. 01/02/21   Rehman, Areeg N, DO  ?fluticasone (FLONASE) 50 MCG/ACT nasal spray Place 1 spray into both nostrils daily. ?Patient not taking: Reported on 11/20/2021 09/11/21   Vanessa Kick, MD  ?levocetirizine (XYZAL) 5 MG tablet Take 1 tablet (5 mg total) by mouth every evening. ?Patient not taking: Reported on 11/20/2021 10/02/21   Scot Jun, FNP  ?Lidocaine, Anorectal, 50 MG SUPP Place 50 mg rectally every 6 (six) hours. 05/07/21   Marney Setting, NP  ?naproxen (NAPROSYN) 500 MG tablet Take 1 tablet (500 mg total) by mouth 2 (two) times daily. 11/27/20   Pearson Forster, NP  ?ondansetron  (ZOFRAN-ODT) 4 MG disintegrating tablet Take 1 tablet (4 mg total) by mouth every 8 (eight) hours as needed for nausea or vomiting. ?Patient not taking: Reported on 11/20/2021 10/07/21   Barrett Henle, MD  ?Prenat-Fe Poly-Methfol-FA-DHA (VITAFOL ULTRA) 29-0.6-0.4-200 MG CAPS Take 1 capsule by mouth daily before breakfast. 01/11/21   Shelly Bombard, MD  ?ipratropium (ATROVENT) 0.06 % nasal spray Place 2 sprays into both nostrils 4 (four) times daily. ?Patient not taking: Reported on 10/14/2016 09/05/16 12/30/19  Lysbeth Penner, FNP  ? ? ?Family History ?Family History  ?Problem Relation Age of Onset  ? Diabetes Father   ? Hypertension Father   ? Hyperlipidemia Father   ? Cancer Maternal Aunt   ?     breast  ? Hypertension Paternal Grandmother   ? Stroke Paternal Grandmother   ? ? ?Social History ?Social History  ? ?Tobacco Use  ? Smoking status: Former  ?  Types: Cigarettes  ?  Quit date: 07/15/2015  ?  Years since quitting: 6.3  ? Smokeless tobacco: Never  ? Tobacco comments:  ?  prior to preg  ?Vaping Use  ? Vaping Use: Never used  ?Substance Use Topics  ? Alcohol use: No  ?  Alcohol/week: 0.0 standard drinks  ? Drug use: Yes  ?  Types: Marijuana  ? ? ? ?Allergies   ?Levofloxacin ? ? ?Review of Systems ?Review of Systems ?Per HPI ? ?Physical Exam ?Triage Vital Signs ?ED Triage Vitals  ?Enc Vitals Group  ?   BP 11/20/21 1839 135/80  ?   Pulse Rate 11/20/21 1839 78  ?   Resp 11/20/21 1839 18  ?   Temp 11/20/21 1839 99.4 ?F (37.4 ?C)  ?   Temp Source 11/20/21 1839 Oral  ?   SpO2 11/20/21 1839 98 %  ?   Weight --   ?   Height --   ?   Head Circumference --   ?   Peak Flow --   ?   Pain Score 11/20/21 1836 10  ?   Pain Loc --   ?   Pain Edu? --   ?   Excl. in [place]? --   ? ?No data found. ? ?Updated Vital Signs ?BP 135/80 (BP Location: Right Arm)   Pulse 78   Temp 99.4 ?F (37.4 ?C) (Oral)   Resp 18   LMP 10/26/2021   SpO2 98%  ? ?Visual Acuity ?Right Eye Distance:   ?Left Eye Distance:   ?Bilateral Distance:    ? ?  Right Eye Near:   ?Left Eye Near:    ?Bilateral Near:    ? ?Physical Exam ?Vitals and nursing note reviewed.  ?Constitutional:   ?   General: She is not in acute distress. ?   Appearance: Normal appearance. She is not toxic-appearing.  ?HENT:  ?   Mouth/Throat:  ?   Mouth: Mucous membranes are moist.  ?   Pharynx: Oropharynx is clear.  ?Pulmonary:  ?   Effort: Pulmonary effort is normal. No respiratory distress.  ?Abdominal:  ?   General: Abdomen is flat. Bowel sounds are normal. There is no distension.  ?   Palpations: Abdomen is soft.  ?   Tenderness: There is no abdominal tenderness.  ?Musculoskeletal:     ?   General: Normal range of motion.  ?   Right wrist: No swelling, deformity, tenderness or bony tenderness. Normal range of motion. Normal pulse.  ?   Left wrist: No swelling, deformity, tenderness or bony tenderness. Normal range of motion. Normal pulse.  ?Skin: ?   General: Skin is warm and dry.  ?   Capillary Refill: Capillary refill takes less than 2 seconds.  ?   Coloration: Skin is not jaundiced or pale.  ?   Findings: No erythema.  ?Neurological:  ?   Mental Status: She is alert and oriented to person, place, and time.  ?Psychiatric:     ?   Behavior: Behavior is cooperative.  ? ? ? ?UC Treatments / Results  ?Labs ?(all labs ordered are listed, but only abnormal results are displayed) ?Labs Reviewed - No data to display ? ?EKG ? ? ?Radiology ?No results found. ? ?Procedures ?Procedures (including critical care time) ? ?Medications Ordered in UC ?Medications  ?ketorolac (TORADOL) 30 MG/ML injection 30 mg (30 mg Intramuscular Given 11/20/21 1921)  ? ? ?Initial Impression / Assessment and Plan / UC Course  ?I have reviewed the triage vital signs and the nursing notes. ? ?Pertinent labs & imaging results that were available during my care of the patient were reviewed by me and considered in my medical decision making (see chart for details). ? ?  ?Treat bilateral wrist pain with Toradol 30 mg IM today  in urgent care.  Discussed wrist bracing at nighttime to help with possible carpal tunnel.  Encouraged follow-up with hand specialty with worsening or no improvement.  Note given for work. ? ?We also discussed that she

## 2021-11-20 NOTE — Discharge Instructions (Addendum)
-   We have given you a shot of Toradol 30 mg IM today in urgent care ?- Try wearing the wrist braces at night time to help with the wrist pain ?- If you are worried about being allergic to lactose, you can try to eliminate lactose from your diet and see if this helps  ?

## 2021-11-20 NOTE — ED Triage Notes (Addendum)
Patient reports swelling and soreness around ears and concerned this is sinus drainage.  Concerned this is swollen lymph nodes.  Reports sore throat and runny nose for a month ? 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   ?

## 2021-11-25 ENCOUNTER — Encounter (HOSPITAL_COMMUNITY): Payer: Self-pay

## 2021-11-25 ENCOUNTER — Ambulatory Visit (HOSPITAL_COMMUNITY)
Admission: EM | Admit: 2021-11-25 | Discharge: 2021-11-25 | Disposition: A | Discharge status: Home / Self Care | Payer: 59 | Attending: Internal Medicine | Admitting: Internal Medicine

## 2021-11-25 DIAGNOSIS — Z20822 Contact with and (suspected) exposure to covid-19: Secondary | ICD-10-CM | POA: Insufficient documentation

## 2021-11-25 DIAGNOSIS — J029 Acute pharyngitis, unspecified: Secondary | ICD-10-CM

## 2021-11-25 DIAGNOSIS — R112 Nausea with vomiting, unspecified: Secondary | ICD-10-CM | POA: Diagnosis present

## 2021-11-25 DIAGNOSIS — B349 Viral infection, unspecified: Secondary | ICD-10-CM | POA: Diagnosis not present

## 2021-11-25 DIAGNOSIS — R197 Diarrhea, unspecified: Secondary | ICD-10-CM | POA: Insufficient documentation

## 2021-11-25 LAB — POCT RAPID STREP A, ED / UC: Streptococcus, Group A Screen (Direct): NEGATIVE

## 2021-11-25 MED ORDER — KETOROLAC TROMETHAMINE 30 MG/ML IJ SOLN
INTRAMUSCULAR | Status: AC
  Filled 2021-11-25: qty 1

## 2021-11-25 MED ORDER — ONDANSETRON 4 MG PO TBDP: 4.0000 mg | ORAL_TABLET | Freq: Three times a day (TID) | ORAL | 0 refills | Status: DC | PRN

## 2021-11-25 MED ORDER — KETOROLAC TROMETHAMINE 30 MG/ML IJ SOLN
30.0000 mg | Freq: Once | INTRAMUSCULAR | Status: AC
  Administered 2021-11-25: 30 mg via INTRAMUSCULAR

## 2021-11-25 NOTE — Discharge Instructions (Signed)
It appears that you have a viral illness that should run its course and self resolve in the next few days with symptomatic treatment.  Rapid strep was negative.  Throat culture and COVID test are pending.  We will call if they are positive.  You were given Toradol injection in urgent care today.  Please avoid taking any ibuprofen, Advil, Aleve for at least 24 hours following injection.  You are also prescribed nausea medication to take as needed.  Please ensure adequate fluid hydration.  Go to the hospital if symptoms persist or worsen. ?

## 2021-11-25 NOTE — ED Provider Notes (Signed)
?[facility] ? ? ? ?CSN: [id number] ?Arrival date & time: 11/25/21  1516 ? ? ?  ? ?History   ?Chief Complaint ?Chief Complaint  ?Patient presents with  ? Emesis  ? ? ?HPI ?[name] is a 41 y.o. female.  ? ?Patient presents with nausea, vomiting, diarrhea, cough, nasal congestion, sore throat, headache that started this morning upon awakening.  Denies any known sick contacts.  Denies any known fevers at home.  Denies chest pain or shortness of breath.  Patient has mild abdominal cramping.  Patient requesting Toradol shot for headache.  Headache is present in the front portion of the head.  Patient has not been able to keep any food or fluids down today since symptoms started.  She also requesting Zofran prescription. ? ? ?Emesis ? ?Past Medical History:  ?Diagnosis Date  ? Allergy   ? Anxiety   ? Cough 03/21/2016  ? no fever, states coughing up "snot colored cold stuff"-  has had this x 1.5 weeks and is getting much better- taking tylenol cold  ? Depression   ? never been treated , "probably got some "  ? GERD without esophagitis 05/16/2014  ? Headache(784.0)   ? S/P cesarean section 11/03/2014  ? Screening examination for STD (sexually transmitted disease) 12/11/2016  ? Tooth ache   ? right upper molar partially broke off  ? ? ?Patient Active Problem List  ? Diagnosis Date Noted  ? Vaginal discharge 01/02/2021  ? External hemorrhoid 01/02/2021  ? Wrist pain 01/02/2021  ? Arthralgia of left upper arm 08/07/2019  ? Skin mole 12/11/2016  ? Vitamin D deficiency 11/13/2016  ? Knee pain, bilateral 09/24/2016  ? ALLERGIC RHINITIS 03/19/2007  ? ? ?Past Surgical History:  ?Procedure Laterality Date  ? CESAREAN SECTION    ? CESAREAN SECTION WITH BILATERAL TUBAL LIGATION Bilateral 11/03/2014  ? Procedure: CESAREAN SECTION WITH BILATERAL TUBAL LIGATION;  Surgeon: Shelly Bombard, MD;  Location: [facility];  Service: Obstetrics;  Laterality: Bilateral;  ? CHOLECYSTECTOMY    ? CHOLECYSTECTOMY, LAPAROSCOPIC    ? DILATION AND  CURETTAGE OF UTERUS    ? INCISIONAL HERNIA REPAIR N/A 03/27/2016  ? Procedure: LAPAROSCOPIC ASSISTED REPAIR OF INCARCERATED  INCISIONAL HERNIA;  Surgeon: Greer Pickerel, MD;  Location: [facility];  Service: General;  Laterality: N/A;  ? INSERTION OF MESH N/A 03/27/2016  ? Procedure: INSERTION OF MESH;  Surgeon: Greer Pickerel, MD;  Location: [facility];  Service: General;  Laterality: N/A;  ? THERAPEUTIC ABORTION    ? TUBAL LIGATION    ? ? ?OB History   ? ? Gravida  ?8  ? Para  ?3  ? Term  ?3  ? Preterm  ?0  ? AB  ?4  ? Living  ?3  ?  ? ? SAB  ?1  ? IAB  ?3  ? Ectopic  ?   ? Multiple  ?0  ? Live Births  ?2  ?   ?  ?  ? ? ? ?Home Medications   ? ?Prior to Admission medications   ?Medication Sig Start Date End Date Taking? Authorizing Provider  ?hydrocortisone cream 1 % Apply to affected area 2 times daily for up to one week ?Patient not taking: Reported on 01/11/2021 01/07/21 01/07/22  Rehman, Areeg N, DO  ?ondansetron (ZOFRAN-ODT) 4 MG disintegrating tablet Take 1 tablet (4 mg total) by mouth every 8 (eight) hours as needed for nausea or vomiting. 11/25/21  Yes Teodora Medici, FNP  ?diclofenac Sodium (  VOLTAREN) 1 % GEL Apply 2 g topically 4 (four) times daily. 01/02/21   Rehman, Areeg N, DO  ?fluticasone (FLONASE) 50 MCG/ACT nasal spray Place 1 spray into both nostrils daily. ?Patient not taking: Reported on 11/20/2021 09/11/21   Vanessa Kick, MD  ?levocetirizine (XYZAL) 5 MG tablet Take 1 tablet (5 mg total) by mouth every evening. ?Patient not taking: Reported on 11/20/2021 10/02/21   Scot Jun, FNP  ?Lidocaine, Anorectal, 50 MG SUPP Place 50 mg rectally every 6 (six) hours. 05/07/21   Marney Setting, NP  ?naproxen (NAPROSYN) 500 MG tablet Take 1 tablet (500 mg total) by mouth 2 (two) times daily. 11/27/20   Pearson Forster, NP  ?ipratropium (ATROVENT) 0.06 % nasal spray Place 2 sprays into both nostrils 4 (four) times daily. ?Patient not taking: Reported on 10/14/2016 09/05/16 12/30/19  Lysbeth Penner, FNP  ? ? ?Family  History ?Family History  ?Problem Relation Age of Onset  ? Diabetes Father   ? Hypertension Father   ? Hyperlipidemia Father   ? Cancer Maternal Aunt   ?     breast  ? Hypertension Paternal Grandmother   ? Stroke Paternal Grandmother   ? ? ?Social History ?Social History  ? ?Tobacco Use  ? Smoking status: Former  ?  Types: Cigarettes  ?  Quit date: 07/15/2015  ?  Years since quitting: 6.3  ? Smokeless tobacco: Never  ? Tobacco comments:  ?  prior to preg  ?Vaping Use  ? Vaping Use: Never used  ?Substance Use Topics  ? Alcohol use: No  ?  Alcohol/week: 0.0 standard drinks  ? Drug use: Yes  ?  Types: Marijuana  ? ? ? ?Allergies   ?Levofloxacin ? ? ?Review of Systems ?Review of Systems ?Per HPI ? ?Physical Exam ?Triage Vital Signs ?ED Triage Vitals [11/25/21 1635]  ?Enc Vitals Group  ?   BP (!) 146/92  ?   Pulse Rate 73  ?   Resp 18  ?   Temp 98.5 ?F (36.9 ?C)  ?   Temp Source Tympanic  ?   SpO2 100 %  ?   Weight   ?   Height   ?   Head Circumference   ?   Peak Flow   ?   Pain Score 10  ?   Pain Loc   ?   Pain Edu?   ?   Excl. in [place]?   ? ?No data found. ? ?Updated Vital Signs ?BP (!) 146/92 (BP Location: Left Arm)   Pulse 73   Temp 98.5 ?F (36.9 ?C) (Tympanic)   Resp 18   LMP 10/26/2021   SpO2 100%  ? ?Visual Acuity ?Right Eye Distance:   ?Left Eye Distance:   ?Bilateral Distance:   ? ?Right Eye Near:   ?Left Eye Near:    ?Bilateral Near:    ? ?Physical Exam ?Constitutional:   ?   General: She is not in acute distress. ?   Appearance: Normal appearance. She is not toxic-appearing or diaphoretic.  ?HENT:  ?   Head: Normocephalic and atraumatic.  ?   Right Ear: Tympanic membrane and ear canal normal.  ?   Left Ear: Tympanic membrane and ear canal normal.  ?   Nose: Congestion present.  ?   Mouth/Throat:  ?   Mouth: Mucous membranes are moist.  ?   Pharynx: Posterior oropharyngeal erythema present.  ?Eyes:  ?   Extraocular Movements: Extraocular movements intact.  ?  Conjunctiva/sclera: Conjunctivae normal.  ?    Pupils: Pupils are equal, round, and reactive to light.  ?Cardiovascular:  ?   Rate and Rhythm: Normal rate and regular rhythm.  ?   Pulses: Normal pulses.  ?   Heart sounds: Normal heart sounds.  ?Pulmonary:  ?   Effort: Pulmonary effort is normal. No respiratory distress.  ?   Breath sounds: Normal breath sounds. No stridor. No wheezing, rhonchi or rales.  ?Abdominal:  ?   General: Abdomen is flat. Bowel sounds are normal.  ?   Palpations: Abdomen is soft.  ?Musculoskeletal:     ?   General: Normal range of motion.  ?   Cervical back: Normal range of motion.  ?Skin: ?   General: Skin is warm and dry.  ?Neurological:  ?   General: No focal deficit present.  ?   Mental Status: She is alert and oriented to person, place, and time. Mental status is at baseline.  ?Psychiatric:     ?   Mood and Affect: Mood normal.     ?   Behavior: Behavior normal.  ? ? ? ?UC Treatments / Results  ?Labs ?(all labs ordered are listed, but only abnormal results are displayed) ?Labs Reviewed  ?CULTURE, GROUP A STREP [facility])  ?SARS CORONAVIRUS 2 (TAT 6-24 HRS)  ?POCT RAPID STREP A, ED / UC  ? ? ?EKG ? ? ?Radiology ?No results found. ? ?Procedures ?Procedures (including critical care time) ? ?Medications Ordered in UC ?Medications  ?ketorolac (TORADOL) 30 MG/ML injection 30 mg (has no administration in time range)  ? ? ?Initial Impression / Assessment and Plan / UC Course  ?I have reviewed the triage vital signs and the nursing notes. ? ?Pertinent labs & imaging results that were available during my care of the patient were reviewed by me and considered in my medical decision making (see chart for details). ? ?  ? ?Symptoms and physical exam appear viral in etiology.  Rapid strep was negative.  Throat culture is pending.  COVID test pending.  Discussed supportive care and symptom management with patient.  No signs of dehydration on exam.  Encouraged adequate fluid hydration with patient.  IM Toradol was administered in urgent care today per  patient request and I do think this is reasonable for headache.  Zofran prescribed for patient to take as needed for nausea.  Discussed return precautions.  Patient verbalized understanding and was agreeable with plan

## 2021-11-25 NOTE — ED Triage Notes (Signed)
Pt states woke up this morning with n/v x2 and now having a headache. States hasn't ate or drink anything today. Pt requesting a Toradol shot for pain. ?

## 2021-11-26 LAB — SARS CORONAVIRUS 2 (TAT 6-24 HRS): SARS Coronavirus 2: NEGATIVE

## 2021-11-28 LAB — CULTURE, GROUP A STREP (THRC)

## 2021-12-12 ENCOUNTER — Encounter: Payer: Self-pay | Admitting: Student

## 2021-12-12 ENCOUNTER — Ambulatory Visit (INDEPENDENT_AMBULATORY_CARE_PROVIDER_SITE_OTHER): Payer: 59 | Admitting: Student

## 2021-12-12 VITALS — BP 121/80 | HR 84 | Ht 63.0 in | Wt 186.5 lb

## 2021-12-12 DIAGNOSIS — Z7689 Persons encountering health services in other specified circumstances: Secondary | ICD-10-CM | POA: Diagnosis not present

## 2021-12-12 DIAGNOSIS — J329 Chronic sinusitis, unspecified: Secondary | ICD-10-CM | POA: Diagnosis not present

## 2021-12-12 DIAGNOSIS — Z0000 Encounter for general adult medical examination without abnormal findings: Secondary | ICD-10-CM

## 2021-12-12 NOTE — Assessment & Plan Note (Signed)
No complaints today. Basic labs obtained (CBC, CMP) as well as Vitamin-D level given deficiency in the past. Will f/u on results w/ patient

## 2021-12-12 NOTE — Progress Notes (Signed)
    SUBJECTIVE:   CHIEF COMPLAINT / HPI:   Ms. Marilyn (pronounced Kee-ya) [name] is here to establish care. She says she has never had a PCP in the past, but used to be followed by IM residency clinic.  Works in [employer], has 3 kids: 72, 44, 7  Medical hx: None Medications: None, MultiVitamin  Social hx: Works at [employer]. Lives at home with her children, no pets. ETOh use. Marijuana vaping - a few times week. Cigratte use  Family hx: T2DM, HTN, HLD Surgical hx: Cesarean section w/ bilateral tubal ligation, cholecystectomy,  hernia repair Allergies: None Sexual orientation- straight LMP 11/26/21, Had b/l tubal ligation, no current sexual activity. Last pap smear 01/02/2021 NILM and negative HPV.  She needs 3 weeks out of work for hemorrhoid surgery.  OBJECTIVE:   BP 121/80   Pulse 84   Ht '5\' 3"'$  (1.6 m)   Wt 186 lb 8 oz (84.6 kg)   LMP 11/26/2021   SpO2 99%   BMI 33.04 kg/m    General: Well-appearing, pleasant, in no distress CV: RRR, no murmurs Resp: Normal WOB on room air, no wheezing or crackles Abdomen: Soft, NTND, no palpable masses, bowel sounds present Ext: Warm, dry, no edema Skin: No rashes or lesions Psych: Normal mood and affect   ASSESSMENT/PLAN:   Encounter for medical examination to establish care No complaints today. Basic labs obtained (CBC, CMP) as well as Vitamin-D level given deficiency in the past. Will f/u on results w/ patient   Orvis Brill, [place]

## 2021-12-12 NOTE — Patient Instructions (Signed)
It was great seeing you today.  We collected labs today. I will call you with your results.  I also placed a referral to ENT for your chronic sinusitis     Be well,  Dr. Orvis Brill [facility] [phone]

## 2021-12-13 LAB — COMPREHENSIVE METABOLIC PANEL
ALT: 12 IU/L (ref 0–32)
AST: 20 IU/L (ref 0–40)
Albumin/Globulin Ratio: 1.7 (ref 1.2–2.2)
Albumin: 4.5 g/dL (ref 3.8–4.8)
Alkaline Phosphatase: 82 IU/L (ref 44–121)
BUN/Creatinine Ratio: 25 — ABNORMAL HIGH (ref 9–23)
BUN: 15 mg/dL (ref 6–24)
Bilirubin Total: <0.2 mg/dL (ref 0.0–1.2)
CO2: 25 mmol/L (ref 20–29)
Calcium: 9.3 mg/dL (ref 8.7–10.2)
Chloride: 104 mmol/L (ref 96–106)
Creatinine, Ser: 0.59 mg/dL (ref 0.57–1.00)
Globulin, Total: 2.6 g/dL (ref 1.5–4.5)
Glucose: 92 mg/dL (ref 70–99)
Potassium: 4.3 mmol/L (ref 3.5–5.2)
Sodium: 142 mmol/L (ref 134–144)
Total Protein: 7.1 g/dL (ref 6.0–8.5)
eGFR: 115 mL/min/{1.73_m2} (ref >59)

## 2021-12-13 LAB — CBC WITH DIFFERENTIAL/PLATELET
Basophils Absolute: 0.1 10*3/uL (ref 0.0–0.2)
Basos: 1 %
EOS (ABSOLUTE): 0.1 10*3/uL (ref 0.0–0.4)
Eos: 1 %
Hematocrit: 33.4 % — ABNORMAL LOW (ref 34.0–46.6)
Hemoglobin: 11.2 g/dL (ref 11.1–15.9)
Immature Grans (Abs): 0 10*3/uL (ref 0.0–0.1)
Immature Granulocytes: 0 %
Lymphocytes Absolute: 2.5 10*3/uL (ref 0.7–3.1)
Lymphs: 37 %
MCH: 30 pg (ref 26.6–33.0)
MCHC: 33.5 g/dL (ref 31.5–35.7)
MCV: 90 fL (ref 79–97)
Monocytes Absolute: 0.6 10*3/uL (ref 0.1–0.9)
Monocytes: 8 %
Neutrophils Absolute: 3.6 10*3/uL (ref 1.4–7.0)
Neutrophils: 53 %
Platelets: 213 10*3/uL (ref 150–450)
RBC: 3.73 x10E6/uL — ABNORMAL LOW (ref 3.77–5.28)
RDW: 14.5 % (ref 11.7–15.4)
WBC: 6.9 10*3/uL (ref 3.4–10.8)

## 2021-12-13 LAB — LIPID PANEL
Chol/HDL Ratio: 3.3 ratio (ref 0.0–4.4)
Cholesterol, Total: 164 mg/dL (ref 100–199)
HDL: 49 mg/dL (ref >39)
LDL Chol Calc (NIH): 96 mg/dL (ref 0–99)
Triglycerides: 107 mg/dL (ref 0–149)
VLDL Cholesterol Cal: 19 mg/dL (ref 5–40)

## 2021-12-13 LAB — VITAMIN D 25 HYDROXY (VIT D DEFICIENCY, FRACTURES): Vit D, 25-Hydroxy: 20.3 ng/mL — ABNORMAL LOW (ref 30.0–100.0)

## 2021-12-16 MED ORDER — VITAMIN D (CHOLECALCIFEROL) 10 MCG (400 UNIT) PO CAPS: 1.0000 | ORAL_CAPSULE | Freq: Every day | ORAL | 1 refills | Status: DC

## 2021-12-16 NOTE — Addendum Note (Signed)
Addended by: Orvis Brill on: 12/16/2021 08:57 AM   Modules accepted: Orders

## 2021-12-17 ENCOUNTER — Encounter: Payer: Self-pay | Admitting: *Deleted

## 2022-02-18 ENCOUNTER — Encounter: Payer: Self-pay | Admitting: *Deleted

## 2022-05-07 ENCOUNTER — Encounter: Payer: Self-pay | Admitting: Student

## 2022-05-07 ENCOUNTER — Ambulatory Visit (INDEPENDENT_AMBULATORY_CARE_PROVIDER_SITE_OTHER): Payer: Commercial Managed Care - HMO | Admitting: Student

## 2022-05-07 DIAGNOSIS — K644 Residual hemorrhoidal skin tags: Secondary | ICD-10-CM | POA: Diagnosis not present

## 2022-05-07 DIAGNOSIS — R229 Localized swelling, mass and lump, unspecified: Secondary | ICD-10-CM | POA: Diagnosis not present

## 2022-05-07 DIAGNOSIS — N946 Dysmenorrhea, unspecified: Secondary | ICD-10-CM | POA: Diagnosis not present

## 2022-05-07 MED ORDER — NAPROXEN 500 MG PO TABS: 500.0000 mg | ORAL_TABLET | Freq: Two times a day (BID) | ORAL | 0 refills | Status: DC

## 2022-05-07 MED ORDER — ONDANSETRON 4 MG PO TBDP: 4.0000 mg | ORAL_TABLET | Freq: Three times a day (TID) | ORAL | 0 refills | Status: DC | PRN

## 2022-05-07 MED ORDER — NORETHINDRONE 0.35 MG PO TABS: 1.0000 | ORAL_TABLET | Freq: Every day | ORAL | 11 refills | Status: DC

## 2022-05-07 MED ORDER — PHENYLEPHRINE-MINERAL OIL-PET 0.25-14-74.9 % RE OINT: 1.0000 | TOPICAL_OINTMENT | Freq: Two times a day (BID) | RECTAL | 0 refills | Status: DC | PRN

## 2022-05-07 MED ORDER — POLYETHYLENE GLYCOL 3350 17 GM/SCOOP PO POWD: 17.0000 g | Freq: Every day | ORAL | 0 refills | Status: DC

## 2022-05-07 NOTE — Progress Notes (Unsigned)
    SUBJECTIVE:   CHIEF COMPLAINT / HPI:   LMP: 9/25  Gets periods 1x a month. Have always been painful and bad. Gets very heavy x 1 day, lasts 7 days. Tubal ligation in 2016.  Takes ibuprofen. Has awful nausea, vomiting.  Hemorrhoids Bleeding Pain w/ BM Constipated  PERTINENT  PMH / PSH: ***  OBJECTIVE:   There were no vitals taken for this visit. ***   ASSESSMENT/PLAN:   No problem-specific Assessment & Plan notes found for this encounter.     Orvis Brill, [place]    {    This will disappear when note is signed, click to select method of visit    :1}

## 2022-05-07 NOTE — Patient Instructions (Addendum)
Great to see you today!  As we discussed, For your periods, we will try the oral hormonal contraceptive pill to try and diminish severity of symptoms. Consider the IUD as a way to relieve your periods as well. I sent in Zofran for nausea/vomiting- take this sparingly only as needed for severe nausea/vomiting Take NSAIDS ( I sent in Naproxen) twice daily during your periods.  2. For your hemorrhoids/constipation- take 1 capful (17g) of Miralax mixed in water/juice/etc daily. And, use preparation H ointment on your rectum before and after a bowel movement for pain and to help reduce size of hemorrhoid.  If your bump on your neck gets larger/drains pus/or you develop fever, please call us.  Dr. Keturah Barre

## 2022-05-08 DIAGNOSIS — R229 Localized swelling, mass and lump, unspecified: Secondary | ICD-10-CM | POA: Insufficient documentation

## 2022-05-08 DIAGNOSIS — N946 Dysmenorrhea, unspecified: Secondary | ICD-10-CM | POA: Insufficient documentation

## 2022-05-08 HISTORY — DX: Localized swelling, mass and lump, unspecified: R22.9

## 2022-05-08 NOTE — Assessment & Plan Note (Signed)
Patient is not yet perimenopausal. For her dysmenorrhea, we will trial progestin only pills.  Estrogen is contraindicated given that she is current smoker. Discussed IUD as other alternative which would help to potentially reduce bleeding and or completely rid of menses.  She will think about this. Encouraged to continue naproxen twice daily during menses, and plenty of fluid intake. No concern for structural abnormality at this time. If continued, can consider pelvic ultrasound.

## 2022-05-08 NOTE — Assessment & Plan Note (Signed)
Persistent external hemorrhoids with constipation Rectal exam showing multiple external hemorrhoids, but no bleeding.  Mildly tender to palpation. We will rerefer to general surgery Discussed bowel regimen including MiraLAX 1 capful daily, titrate up as needed.  Can also use senna. Prescribed Preparation H to be used before and after bowel movement for pain, and to help decrease size of hemorrhoids

## 2022-05-08 NOTE — Assessment & Plan Note (Signed)
2 cm x 1 cm mobile fleshy nodule over anterior neck. No concern for infection, lack of erythema, warmth, tenderness. Encouraged her to leave it alone, use warm compresses, if increase in size or develops any of the above symptoms +/- irritation in the area and/or chills, to please call us or return to clinic for antibiotics. Believe this is likely a small lipoma versus cyst.  She can also be seen in our Derm clinic for this.

## 2022-05-09 NOTE — Progress Notes (Signed)
Patient scheduled for 05/29/22

## 2022-05-28 ENCOUNTER — Encounter: Payer: Self-pay | Admitting: *Deleted

## 2022-05-29 ENCOUNTER — Ambulatory Visit (INDEPENDENT_AMBULATORY_CARE_PROVIDER_SITE_OTHER): Payer: Commercial Managed Care - HMO | Admitting: Student

## 2022-05-29 VITALS — BP 115/80 | HR 92 | Wt 188.8 lb

## 2022-05-29 DIAGNOSIS — L723 Sebaceous cyst: Secondary | ICD-10-CM | POA: Insufficient documentation

## 2022-05-29 HISTORY — DX: Sebaceous cyst: L72.3

## 2022-05-29 NOTE — Assessment & Plan Note (Signed)
Patient had 1 month of nodule on her neck. Punch excision most likely inclusion/sebaceous cyst on exam. Advised may come back if whole cyst wall not excised.  -discussed return/infection precautions -monitor at follow up

## 2022-05-29 NOTE — Progress Notes (Signed)
    SUBJECTIVE:   CHIEF COMPLAINT / HPI: Neck mass  Mass on the front of her neck for the past month. She feels that it has been bothering her. Has not expressed it herself and no pus coming out of it. No where else on body that she has this. No fevers or vomiting. She is not on any blood thinners or immunologics.  PERTINENT  PMH / PSH: noncontributory  OBJECTIVE:   BP 115/80   Pulse 92   Wt 188 lb 12.8 oz (85.6 kg)   LMP 04/07/2022 (Exact Date)   SpO2 100%   BMI 33.44 kg/m    Gen: NAD, nontoxic appearing Skin: 1 cm mobile mass, TTP, no erythema, no drainage, rubbery feeling, no fluctuance, some indurated areas      Cyst Removal  Discussed risks/benefits of procedure, and patient gave consent for excision of cyst.  Prepped skin in usual sterile fashion.  Used 4 cc of 1% lidocaine with epinephrine for local anesthesia, 3 mm punch tool used with purulent material expressed, and scissors and forceps to tease away cyst sac. Covered wound with sterile bandage and dressing. Patient tolerated procedure well.  Discussed wound care and provided wound care handout. Performed by Dr. Salvadore Oxford and myself with attending Dr. Erin Hearing supervising.   ASSESSMENT/PLAN:   Sebaceous cyst Patient had 1 month of nodule on her neck. Punch excision most likely inclusion/sebaceous cyst on exam. Advised may come back if whole cyst wall not excised.  -discussed return/infection precautions -monitor at follow up   Gerrit Heck, [place]

## 2022-05-29 NOTE — Patient Instructions (Signed)
It was great to see you! Thank you for allowing me to participate in your care!   Our plans for today:  - We excised the area on your neck and got rid of most of the cyst sac - Please look at the document attached for after care  Take care and seek immediate care sooner if you develop any concerns.  Gerrit Heck, MD

## 2022-06-04 NOTE — Telephone Encounter (Signed)
Patient lm inquiring about her referrals and her insurance updates.  I left patient a message with the contact information for both [facility] and [facility].  She needs to update them on her new medicaid plan and the start dates.  Jobe Mutch,CMA

## 2022-06-27 ENCOUNTER — Encounter: Payer: Self-pay | Admitting: Family Medicine

## 2022-06-27 ENCOUNTER — Ambulatory Visit (INDEPENDENT_AMBULATORY_CARE_PROVIDER_SITE_OTHER): Payer: Commercial Managed Care - HMO | Admitting: Family Medicine

## 2022-06-27 VITALS — BP 133/82 | HR 82 | Wt 193.2 lb

## 2022-06-27 DIAGNOSIS — M25562 Pain in left knee: Secondary | ICD-10-CM | POA: Diagnosis not present

## 2022-06-27 MED ORDER — NAPROXEN 500 MG PO TABS
500.0000 mg | ORAL_TABLET | Freq: Two times a day (BID) | ORAL | 0 refills | Status: DC
Start: 2022-06-27 — End: 2022-09-08

## 2022-06-27 NOTE — Progress Notes (Signed)
    SUBJECTIVE:   CHIEF COMPLAINT / HPI:  Chief Complaint  Patient presents with   Leg Pain    Left     Patient reports she has had left posterior knee pain and swelling that started about 1 week ago.  She has had chronic issues with her left knee from time to time.  She is not physically active and has not had any known injuries.  Pain is worse with extension of the knee and prolonged standing.  Denies history of blood clots, recent travel.  At the very end of the visit, patient was asking for referral for carpal tunnel syndrome.  She reports that she has had symptoms in both of her upper extremities for some time.  She used to have a wrist brace but does not know where it is.  PERTINENT  PMH / PSH:   Patient Care Team: Orvis Brill, DO as PCP - General (Family Medicine)   OBJECTIVE:   BP 133/82   Pulse 82   Wt 193 lb 4 oz (87.7 kg)   LMP 06/09/2022   SpO2 100%   BMI 34.23 kg/m   Physical Exam Constitutional:      General: She is not in acute distress. Cardiovascular:     Rate and Rhythm: Normal rate and regular rhythm.     Pulses:          Dorsalis pedis pulses are 2+ on the right side and 2+ on the left side.  Pulmonary:     Effort: Pulmonary effort is normal. No respiratory distress.     Breath sounds: Normal breath sounds.  Musculoskeletal:     Cervical back: Neck supple.     Comments: No obvious swelling seen on exam.  She has tenderness to palpation along the posterior aspect of the left knee.  Pain elicited with full extension and with flexion of the knee.  Varus/valgus stressing negative.  Negative posterior drawer and negative Lachman.  Negative McMurray.  Neurological:     Mental Status: She is alert.     Comments: Full strength with knee flexion and extension bilaterally.         06/27/2022    3:48 PM  Depression screen PHQ 2/9  Decreased Interest 2  Down, Depressed, Hopeless 0  PHQ - 2 Score 2  Altered sleeping 2  Tired, decreased energy 2   Change in appetite 2  Feeling bad or failure about yourself  0  Trouble concentrating 0  Moving slowly or fidgety/restless 0  Suicidal thoughts 0  PHQ-9 Score 8     {Show previous vital signs (optional):23777}    ASSESSMENT/PLAN:   Acute pain of left knee  Atraumatic posterior left knee pain ongoing for 1 week.  Patient reports swelling but there is no significant swelling noted on exam.  I think she may have a small Baker's cyst or bursitis possibly.   - naproxen 500 mg BID x 7d - conservatives measures, ice  Regarding possible carpal tunnel syndrome, I advised that she try a wrist splint on both wrists at night and I gave her the contact information for sports medicine.  Return in about 6 weeks (around 08/08/2022) for f/u knee pain.   Zola Button, MD [place]

## 2022-06-27 NOTE — Patient Instructions (Addendum)
It was nice seeing you today!  Take your naproxen twice a day for 7 days.  Do not take any ibuprofen with this.  You can use ice as needed for swelling.  Follow-up in 4 to 6 weeks for your knee pain or sooner if not improving or worsening.  Stay well, Zola Button, MD [place] [phone]  --  Make sure to check out at the front desk before you leave today.  Please arrive at least 15 minutes prior to your scheduled appointments.  If you had blood work today, I will send you a MyChart message or a letter if results are normal. Otherwise, I will give you a call.  If you had a referral placed, they will call you to set up an appointment. Please give [place] a call if you don't hear back in the next 2 weeks.  If you need additional refills before your next appointment, please call your pharmacy first.

## 2022-07-15 ENCOUNTER — Ambulatory Visit (HOSPITAL_COMMUNITY)
Admission: EM | Admit: 2022-07-15 | Discharge: 2022-07-15 | Discharge status: Against Medical Advice | Payer: Medicaid Other

## 2022-07-15 DIAGNOSIS — R6889 Other general symptoms and signs: Secondary | ICD-10-CM | POA: Diagnosis not present

## 2022-07-15 DIAGNOSIS — R030 Elevated blood-pressure reading, without diagnosis of hypertension: Secondary | ICD-10-CM | POA: Diagnosis not present

## 2022-07-15 DIAGNOSIS — U071 COVID-19: Secondary | ICD-10-CM | POA: Diagnosis not present

## 2022-07-15 DIAGNOSIS — J029 Acute pharyngitis, unspecified: Secondary | ICD-10-CM | POA: Diagnosis not present

## 2022-07-15 NOTE — ED Notes (Signed)
Called from waiting area without response.

## 2022-07-15 NOTE — ED Notes (Signed)
Pt called from waiting area without response.

## 2022-07-16 ENCOUNTER — Telehealth: Payer: Self-pay

## 2022-07-16 MED ORDER — ONDANSETRON HCL 4 MG PO TABS: 4.0000 mg | ORAL_TABLET | Freq: Three times a day (TID) | ORAL | 0 refills | Status: DC | PRN

## 2022-07-16 NOTE — Telephone Encounter (Signed)
Called and spoke to pt.Verified name and DOB. Informed pt of the information from Dr. Owens Shark. Ottis Stain, CMA

## 2022-07-16 NOTE — Telephone Encounter (Signed)
Patient calls nurse line regarding positive COVID test. She was seen yesterday at [facility] and tested positive. She was also started on Paxlovid. She reports fatigue, sore throat, nausea.   She is requesting anti nausea medication. If appropriate, [facility] send [facility] [address].   She is also asking about time frame for receiving flu and COVID vaccine.   Please advise.   Talbot Grumbling, RN

## 2022-08-07 ENCOUNTER — Other Ambulatory Visit: Payer: Self-pay | Admitting: Family Medicine

## 2022-08-07 ENCOUNTER — Other Ambulatory Visit: Payer: Self-pay | Admitting: Student

## 2022-09-03 ENCOUNTER — Telehealth: Payer: Self-pay | Admitting: Gastroenterology

## 2022-09-03 NOTE — Telephone Encounter (Signed)
Hi Dr. Havery Moros,  Supervising Provider: 09/03/22-PM   We received a referral for patient to be evaluated for rectal bleeding. She odes have GI history over at [facility] and all her records were obtained for you to review and advise on scheduling.    Thanks

## 2022-09-05 NOTE — Telephone Encounter (Signed)
Patient called to get update on transfer request. Please advise.

## 2022-09-08 ENCOUNTER — Other Ambulatory Visit: Payer: Self-pay

## 2022-09-08 ENCOUNTER — Ambulatory Visit (INDEPENDENT_AMBULATORY_CARE_PROVIDER_SITE_OTHER): Payer: Medicaid Other | Admitting: Family Medicine

## 2022-09-08 VITALS — BP 149/102 | HR 84 | Ht 63.0 in | Wt 197.2 lb

## 2022-09-08 DIAGNOSIS — J069 Acute upper respiratory infection, unspecified: Secondary | ICD-10-CM

## 2022-09-08 DIAGNOSIS — N946 Dysmenorrhea, unspecified: Secondary | ICD-10-CM | POA: Diagnosis not present

## 2022-09-08 DIAGNOSIS — Z23 Encounter for immunization: Secondary | ICD-10-CM | POA: Diagnosis not present

## 2022-09-08 MED ORDER — BENZONATATE 100 MG PO CAPS: 200.0000 mg | ORAL_CAPSULE | Freq: Three times a day (TID) | ORAL | 0 refills | Status: DC | PRN

## 2022-09-08 MED ORDER — NAPROXEN 500 MG PO TABS: 500.0000 mg | ORAL_TABLET | Freq: Two times a day (BID) | ORAL | 0 refills | Status: DC | PRN

## 2022-09-08 MED ORDER — ONDANSETRON 4 MG PO TBDP: 4.0000 mg | ORAL_TABLET | Freq: Three times a day (TID) | ORAL | 0 refills | Status: DC | PRN

## 2022-09-08 NOTE — Patient Instructions (Addendum)
It was wonderful to see you today.  Please bring ALL of your medications with you to every visit.   Today we talked about:  This is most likely a viral infection. This will take time to get over. The treatment for this is supportive care. You can alternate Tylenol and Ibuprofen (dont take if taking Naproxen) for pain or fever every 3 hours (there should be 6 hours in between each dose of Tylenol, and 6 hours in between doses of Ibuprofen). You can take a teaspoon of honey by itself or mixed with water to help cough. Steam baths, Vicks vapor rub, a humidifier and nasal saline spray can help with congestion.   Mucinex may help with congestion.  I have sent in nausea pills that you can take very 8 hours as needed. It is important to stay hydrated!  It is important to keep them hydrated throughout this time!  Frequent hand washing to prevent recurrent illnesses is important.   Please bring them back for recurrent symptoms that are not improving in 1-2 weeks, unable to keep fluids down, or any concerning symptoms to you.    Thank you for coming to your visit as scheduled. We have had a large "no-show" problem lately, and this significantly limits our ability to see and care for patients. As a friendly reminder- if you cannot make your appointment please call to cancel. We do have a no show policy for those who do not cancel within 24 hours. Our policy is that if you miss or fail to cancel an appointment within 24 hours, 3 times in a 71-monthperiod, you may be dismissed from our clinic.   Thank you for choosing [facility]   Please call [phone] any questions about today's appointment.  Please be sure to schedule follow up at the front  desk before you leave today.   ASharion Settler DO PGY-3 Family Medicine

## 2022-09-08 NOTE — Progress Notes (Signed)
    SUBJECTIVE:   CHIEF COMPLAINT / HPI:   [name] is a 42 y.o. female who presents to the [facility] clinic today to discuss the following concerns:   Sick Symptoms Reports N/V starting yesterday. She had about 7-8 episodes yesterday, has had 2 episodes today. She feels low energy. She has been having congestion, yellow productive cough, losing voice, sore throat and these symptoms started last Wednesday. Her children have also been sick with similar symptoms and was diagnosed with a virus last week- they are recovered now. No fever. She continues to feel nauseous, has not attempted to eat today nor has she had any fluids. She states that she has been laying down all morning.   She has taken DayQuil for her symptoms. She reports she had COVID a month ago.  She has not had her flu vaccine yet but is interested in this.  PERTINENT  PMH / PSH: N/A  OBJECTIVE:   BP (!) 158/91   Pulse 84   Ht 5' 3"$  (1.6 m)   Wt 197 lb 3.2 oz (89.4 kg)   SpO2 94%   BMI 34.93 kg/m   Vitals:   09/08/22 1411 09/08/22 1535  BP: (!) 158/91 (!) 149/102  Pulse: 84   SpO2: 94%    General: Pleasant, sitting in chair, no apparent distress but does appear ill  HEENT: Normocephalic, MMM, nares patent without rhinorrhea or congestion, TM clear b/l Neck: Supple, no cervical LAD Cardiac: RRR, no murmurs. Respiratory: CTAB, normal effort, No wheezes, rales or rhonchi Abdomen: Bowel sounds present, nontender, nondistended, no R/G Extremities: no edema Skin: warm and dry Psych: Normal affect and mood  ASSESSMENT/PLAN:   1. Viral URI with cough Suspect viral etiology as her 2 children had similar symptoms and have improved with conservative care. Deferring COVID/flu testing since symptoms ongoing >5 days. Examination benign. Has had recurrent N/V but abdominal exam benign and appears well hydrated.  - Continue conservative care, symptomatic relief  - ondansetron (ZOFRAN-ODT) 4 MG disintegrating tablet; Take 1  tablet (4 mg total) by mouth every 8 (eight) hours as needed for nausea or vomiting.  Dispense: 20 tablet; Refill: 0 - benzonatate (TESSALON PERLES) 100 MG capsule; Take 2 capsules (200 mg total) by mouth 3 (three) times daily as needed for cough.  Dispense: 30 capsule; Refill: 0  2. Menstrual cramps Reports she has run out of her Naproxen she normally takes for menstrual cramps, period is coming up. She requests refill. CMP last checked 12/2021 and normal renal function.   - naproxen (NAPROSYN) 500 MG tablet; Take 1 tablet (500 mg total) by mouth 2 (two) times daily as needed (menstrual cramps).  Dispense: 30 tablet; Refill: 0  3. Need for immunization against influenza - Flu Vaccine QUAD 66moIM (Fluarix, Fluzone & Alfiuria Quad PF)   ASharion Settler DAdair

## 2022-09-10 ENCOUNTER — Telehealth: Payer: Self-pay

## 2022-09-10 ENCOUNTER — Encounter: Payer: Self-pay | Admitting: Family Medicine

## 2022-09-10 NOTE — Telephone Encounter (Signed)
Note sent via [place].

## 2022-09-10 NOTE — Telephone Encounter (Signed)
Patient calls nurse line requesting extension on work letter. She reports that she continues to have nausea and vomiting. Original return to work date was written for 2/29. She is requesting to remain out of work tomorrow as well. She works Monday through Thursday so would need letter to indicate return on Monday, 3/4.  Please advise.   Talbot Grumbling, RN

## 2022-09-16 ENCOUNTER — Encounter: Payer: Self-pay | Admitting: Gastroenterology

## 2022-10-17 DIAGNOSIS — J0191 Acute recurrent sinusitis, unspecified: Secondary | ICD-10-CM | POA: Diagnosis not present

## 2022-10-17 DIAGNOSIS — J3489 Other specified disorders of nose and nasal sinuses: Secondary | ICD-10-CM | POA: Diagnosis not present

## 2022-11-03 ENCOUNTER — Ambulatory Visit: Payer: Medicaid Other | Admitting: Gastroenterology

## 2023-01-12 DIAGNOSIS — Z8619 Personal history of other infectious and parasitic diseases: Secondary | ICD-10-CM

## 2023-01-12 HISTORY — DX: Personal history of other infectious and parasitic diseases: Z86.19

## 2023-02-03 ENCOUNTER — Other Ambulatory Visit (INDEPENDENT_AMBULATORY_CARE_PROVIDER_SITE_OTHER): Payer: Medicaid Other

## 2023-02-03 ENCOUNTER — Encounter: Payer: Self-pay | Admitting: Gastroenterology

## 2023-02-03 ENCOUNTER — Ambulatory Visit (INDEPENDENT_AMBULATORY_CARE_PROVIDER_SITE_OTHER): Payer: Medicaid Other | Admitting: Gastroenterology

## 2023-02-03 VITALS — BP 150/100 | HR 76 | Ht 63.0 in | Wt 187.0 lb

## 2023-02-03 DIAGNOSIS — K625 Hemorrhage of anus and rectum: Secondary | ICD-10-CM

## 2023-02-03 DIAGNOSIS — K5900 Constipation, unspecified: Secondary | ICD-10-CM

## 2023-02-03 DIAGNOSIS — D1803 Hemangioma of intra-abdominal structures: Secondary | ICD-10-CM

## 2023-02-03 DIAGNOSIS — K642 Third degree hemorrhoids: Secondary | ICD-10-CM | POA: Diagnosis not present

## 2023-02-03 DIAGNOSIS — D500 Iron deficiency anemia secondary to blood loss (chronic): Secondary | ICD-10-CM

## 2023-02-03 DIAGNOSIS — K5909 Other constipation: Secondary | ICD-10-CM

## 2023-02-03 LAB — CBC WITH DIFFERENTIAL/PLATELET
Basophils Absolute: 0.1 10*3/uL (ref 0.0–0.1)
Basophils Relative: 0.9 % (ref 0.0–3.0)
Eosinophils Absolute: 0.1 10*3/uL (ref 0.0–0.7)
Eosinophils Relative: 1.5 % (ref 0.0–5.0)
HCT: 30.8 % — ABNORMAL LOW (ref 36.0–46.0)
Hemoglobin: 9.9 g/dL — ABNORMAL LOW (ref 12.0–15.0)
Lymphocytes Relative: 31.7 % (ref 12.0–46.0)
Lymphs Abs: 2.2 10*3/uL (ref 0.7–4.0)
MCHC: 32.3 g/dL (ref 30.0–36.0)
MCV: 88.2 fl (ref 78.0–100.0)
Monocytes Absolute: 0.5 10*3/uL (ref 0.1–1.0)
Monocytes Relative: 7.8 % (ref 3.0–12.0)
Neutro Abs: 4 10*3/uL (ref 1.4–7.7)
Neutrophils Relative %: 58.1 % (ref 43.0–77.0)
Platelets: 182 10*3/uL (ref 150.0–400.0)
RBC: 3.49 Mil/uL — ABNORMAL LOW (ref 3.87–5.11)
RDW: 17.1 % — ABNORMAL HIGH (ref 11.5–15.5)
WBC: 6.9 10*3/uL (ref 4.0–10.5)

## 2023-02-03 LAB — IBC + FERRITIN
Ferritin: 3.4 ng/mL — ABNORMAL LOW (ref 10.0–291.0)
Iron: 23 ug/dL — ABNORMAL LOW (ref 42–145)
Saturation Ratios: 5.6 % — ABNORMAL LOW (ref 20.0–50.0)
TIBC: 407.4 ug/dL (ref 250.0–450.0)
Transferrin: 291 mg/dL (ref 212.0–360.0)

## 2023-02-03 MED ORDER — CALMOL-4 76-10 % RE SUPP: RECTAL | Status: DC

## 2023-02-03 MED ORDER — NA SULFATE-K SULFATE-MG SULF 17.5-3.13-1.6 GM/177ML PO SOLN: 1.0000 | Freq: Once | ORAL | 0 refills | Status: AC

## 2023-02-03 NOTE — Patient Instructions (Addendum)
You have been scheduled for a colonoscopy. Please follow written instructions given to you at your visit today.  Please pick up your prep supplies at the pharmacy within the next 1-3 days.  If you use inhalers (even only as needed), please bring them with you on the day of your procedure.  DO NOT TAKE 7 DAYS PRIOR TO TEST- Trulicity (dulaglutide) Ozempic, Wegovy (semaglutide) Mounjaro (tirzepatide) Bydureon Bcise (exanatide extended release)  DO NOT TAKE 1 DAY PRIOR TO YOUR TEST Rybelsus (semaglutide) Adlyxin (lixisenatide) Victoza (liraglutide) Byetta (exanatide) ___________________________________________________________________________  Bonita Quin will be contacted by [facility] Scheduling in the next 2 days to arrange a liver ultrasound.  The number on your caller ID will be [phone], please answer when they call.  If you have not heard from them in 2 days please call [phone] to schedule.    You have been scheduled for an abdominal ultrasound at [facility] (1st floor of hospital) on _______________ at ____________. Please arrive 30 minutes prior to your appointment for registration. Make certain not to have anything to eat or drink 6 hours prior to your appointment. Should you need to reschedule your appointment, please contact radiology at [phone]. This test typically takes about 30 minutes to perform.  Please go to the lab in the basement of our building to have lab work done as you leave today. Hit "B" for basement when you get on the elevator.  When the doors open the lab is on your left.  We will call you with the results. Thank you.  We have given you samples of the following medication to take: Calmol 4 suppositions: Use as directed  Please purchase the following medications over the counter and take as directed: Miralax: Take once daily  Please go to the lab in the basement of our building to have lab work done as you leave today. Hit "B" for  basement when you get on the elevator.  When the doors open the lab is on your left.  We will call you with the results. Thank you.   Thank you for entrusting me with your care and for choosing [facility], Dr. Ileene Patrick

## 2023-02-03 NOTE — Progress Notes (Signed)
HPI :  42 year old female with a history of internal hemorrhoids, hepatic hemeangioma, constipation, referred by Darral Dash DO for management of hemorrhoids and rectal bleeding.  She reports she has had longstanding symptoms of hemorrhoids for years.  She was seen by Dr. Maisie Fus of general surgery who offered her hemorrhoidectomy but stated she needed a colonoscopy prior to proceeding with that.  The patient states she had just started a new job and could not take time off to recover from surgery so she never had it done, never have the colonoscopy done.  She states she has significant blood in her stool as well as blood on the toilet paper when she wipes herself.  This occurs about with 50% of her bowel movements.  She has some baseline constipation.  She was told to take MiraLAX which has helped her, she takes it once or twice per week and most days is able to have a stool that is not hard, does not strain.  No family history of colon cancer.  She has never had anesthesia complications.  Of note on review of her chart she had a CT scan back in 2017 which showed suspected liver masses.  She had a subsequent MRI which showed large hepatic hemangiomas, the largest 7.1 cm in size.  She does not recall being told she had this.  She denies any pains in her right upper quadrant.  Last CBC was in June  2023  Lab Results  Component Value Date   WBC 6.9 12/12/2021   HGB 11.2 12/12/2021   HCT 33.4 (L) 12/12/2021   MCV 90 12/12/2021   PLT 213 12/12/2021     CT abdomen / pelvis 01/24/2016 -  IMPRESSION: 1. Small midline hernia just above the umbilicus containing only fat. 2. Multiple low-attenuation structures throughout the right and left lobes of liver, most of which appear to have peripheral enhancement and most likely represent hepatic hemangiomas. Consider ultrasound or MRI to assess these areas further if warranted. 3. The appendix and terminal ileum are unremarkable.  MRCP  02/11/2016: IMPRESSION: Multiple hepatic masses with signal characteristics compatible with hepatic hemangiomas, largest of which measures up to 7.1 cm within the right hepatic lobe. Small fat containing ventral abdominal wall hernia. Findings suggestive of pancreatic divisum.    Past Medical History:  Diagnosis Date   Allergy    Anxiety    Cough 03/21/2016   no fever, states coughing up "snot colored cold stuff"-  has had this x 1.5 weeks and is getting much better- taking tylenol cold   Depression    never been treated , "probably got some "   Gall stones    GERD without esophagitis 05/16/2014   Headache(784.0)    Hemorrhoids    S/P cesarean section 11/03/2014   Screening examination for STD (sexually transmitted disease) 12/11/2016   Tooth ache    right upper molar partially broke off     Past Surgical History:  Procedure Laterality Date   CESAREAN SECTION     CESAREAN SECTION WITH BILATERAL TUBAL LIGATION Bilateral 11/03/2014   Procedure: CESAREAN SECTION WITH BILATERAL TUBAL LIGATION;  Surgeon: Brock Bad, MD;  Location: [facility];  Service: Obstetrics;  Laterality: Bilateral;   CHOLECYSTECTOMY     CHOLECYSTECTOMY, LAPAROSCOPIC     DILATION AND CURETTAGE OF UTERUS     INCISIONAL HERNIA REPAIR N/A 03/27/2016   Procedure: LAPAROSCOPIC ASSISTED REPAIR OF INCARCERATED  INCISIONAL HERNIA;  Surgeon: Gaynelle Adu, MD;  Location: [facility];  Service:  General;  Laterality: N/A;   INSERTION OF MESH N/A 03/27/2016   Procedure: INSERTION OF MESH;  Surgeon: Gaynelle Adu, MD;  Location: [facility];  Service: General;  Laterality: N/A;   THERAPEUTIC ABORTION     TUBAL LIGATION     Family History  Problem Relation Age of Onset   Diabetes Father    Hypertension Father    Hyperlipidemia Father    Hypertension Paternal Grandmother    Stroke Paternal Grandmother    Cancer Maternal Aunt        breast   Stomach cancer Neg Hx    Esophageal cancer Neg Hx    Colon cancer Neg Hx    Social  History   Tobacco Use   Smoking status: Former    Current packs/day: 0.00    Types: Cigarettes    Quit date: 07/15/2015    Years since quitting: 7.5   Smokeless tobacco: Never   Tobacco comments:    prior to preg  Vaping Use   Vaping status: Never Used  Substance Use Topics   Alcohol use: No    Alcohol/week: 0.0 standard drinks of alcohol   Drug use: Yes    Types: Marijuana   Current Outpatient Medications  Medication Sig Dispense Refill   fluticasone (FLONASE) 50 MCG/ACT nasal spray Place 1 spray into both nostrils daily. 18.2 mL 2   naproxen (NAPROSYN) 500 MG tablet Take 1 tablet (500 mg total) by mouth 2 (two) times daily as needed (menstrual cramps). 30 tablet 0   phenylephrine-shark liver oil-mineral oil-petrolatum (PREPARATION H) 0.25-14-74.9 % rectal ointment Place 1 Application rectally 2 (two) times daily as needed for hemorrhoids. 56 g 0   polyethylene glycol powder (GLYCOLAX/MIRALAX) 17 GM/SCOOP powder Take 17 g by mouth daily. 500 g 0   benzonatate (TESSALON PERLES) 100 MG capsule Take 2 capsules (200 mg total) by mouth 3 (three) times daily as needed for cough. (Patient not taking: Reported on 02/03/2023) 30 capsule 0   diclofenac Sodium (VOLTAREN) 1 % GEL Apply 2 g topically 4 (four) times daily. (Patient not taking: Reported on 02/03/2023) 4 g 2   levocetirizine (XYZAL) 5 MG tablet Take 1 tablet (5 mg total) by mouth every evening. (Patient not taking: Reported on 02/03/2023) 30 tablet 0   Lidocaine, Anorectal, 50 MG SUPP Place 50 mg rectally every 6 (six) hours. (Patient not taking: Reported on 02/03/2023) 14 suppository 0   norethindrone (ORTHO MICRONOR) 0.35 MG tablet Take 1 tablet (0.35 mg total) by mouth daily. (Patient not taking: Reported on 02/03/2023) 28 tablet 11   ondansetron (ZOFRAN-ODT) 4 MG disintegrating tablet Take 1 tablet (4 mg total) by mouth every 8 (eight) hours as needed for nausea or vomiting. (Patient not taking: Reported on 02/03/2023) 20 tablet 0    Vitamin D, Cholecalciferol, 10 MCG (400 UNIT) CAPS Take 1 capsule by mouth daily. (Patient not taking: Reported on 02/03/2023) 90 capsule 1   No current facility-administered medications for this visit.   Allergies  Allergen Reactions   Levofloxacin Itching     Review of Systems: All systems reviewed and negative except where noted in HPI.   Lab Results  Component Value Date   WBC 6.9 12/12/2021   HGB 11.2 12/12/2021   HCT 33.4 (L) 12/12/2021   MCV 90 12/12/2021   PLT 213 12/12/2021    Lab Results  Component Value Date   NA 142 12/12/2021   CL 104 12/12/2021   K 4.3 12/12/2021   CO2 25 12/12/2021  BUN 15 12/12/2021   CREATININE 0.59 12/12/2021   EGFR 115 12/12/2021   CALCIUM 9.3 12/12/2021   ALBUMIN 4.5 12/12/2021   GLUCOSE 92 12/12/2021    Lab Results  Component Value Date   ALT 12 12/12/2021   AST 20 12/12/2021   ALKPHOS 82 12/12/2021   BILITOT <0.2 12/12/2021     Physical Exam: BP (!) 160/100   Pulse 76   Ht 5\' 3"  (1.6 m)   Wt 187 lb (84.8 kg)   BMI 33.13 kg/m  Constitutional: Pleasant,well-developed, female in no acute distress. HEENT: Normocephalic and atraumatic. Conjunctivae are normal. No scleral icterus. Neck supple.  Cardiovascular: Normal rate, regular rhythm.  Pulmonary/chest: Effort normal and breath sounds normal. No wheezing, rales or rhonchi. Abdominal: Soft, nondistended, nontender. There are no masses palpable. No hepatomegaly. Anoscopy - CMA Lucius Conn standby - grade III hemorrhoids, most inflamed in RA area, skin tags noted Extremities: no edema Lymphadenopathy: No cervical adenopathy noted. Neurological: Alert and oriented to person place and time. Skin: Skin is warm and dry. No rashes noted. Psychiatric: Normal mood and affect. Behavior is normal.   ASSESSMENT: 42 y.o. female here for assessment of the following  1. Grade III hemorrhoids   2. Rectal bleeding   3. Constipation, unspecified constipation type   4. Hepatic  hemangioma    Chronic constipation with fairly frequent rectal bleeding over the past few years.  This is very likely due to internal hemorrhoids which were noted on exam today, however given her age and frequency of bleeding I agree that colonoscopy should be undertaken to exclude other causes prior to undergoing intervention on the hemorrhoids.  We discussed colonoscopy, risks and benefits of the exam and anesthesia and she wants to proceed and understands.  In the interim I recommend she take MiraLAX every day to keep her stools soft and this will help facilitate her bowel prep as well for the exam.  Moving forward, I was able to reduce her hemorrhoids today and she may have grade 3 disease which may be amenable to hemorrhoid banding.  We will await her course and colonoscopy findings, and discuss if she wants to proceed with surgical hemorrhoidectomy versus hemorrhoid banding.  Given frequency of symptoms I think she needs some sort of intervention outside for medical therapy.  I will have her go to the lab for CBC and iron studies today given the frequency of her bleeding, ensure no iron deficiency.  Will also give her some Calmol 4 suppositories to use as needed in the interim to help relieve the irritation from hemorrhoids.  Otherwise reviewed prior imaging with her in regards to the hepatic hemangiomas, which are large.  She has not had imaging in 7 years.  I think a follow-up ultrasound to assess for interval growth is reasonable given the size of hemangiomas on the last exam.  She agrees and wants to proceed.  Further recommendations pending results  PLAN: - take Miralax daily - trial of Calmol4 suppositories PRN - samples given - schedule [facility] - lab today - CBC and TIBC / ferritin panel given frequency of bleeding - RUQ [place] to reassess hepatic hemangiomas (size > 5cm in 2017) - following colonoscopy, anticipate either trial of hemorrhoid banding versus referral back to  colorectal surgery for hemorrhoidectomy  Harlin Rain, MD [facility]  CC: Darral Dash, DO

## 2023-02-05 ENCOUNTER — Encounter: Payer: Self-pay | Admitting: Gastroenterology

## 2023-02-05 ENCOUNTER — Telehealth: Payer: Self-pay | Admitting: Pharmacy Technician

## 2023-02-05 DIAGNOSIS — D509 Iron deficiency anemia, unspecified: Secondary | ICD-10-CM | POA: Insufficient documentation

## 2023-02-05 NOTE — Addendum Note (Signed)
Addended by: Missy Sabins on: 02/05/2023 08:21 AM   Modules accepted: Orders

## 2023-02-05 NOTE — Telephone Encounter (Addendum)
[place], [place] note: Patient will be scheduled as soon as possible.  Auth Submission: NO AUTH NEEDED Site of care: Site of care: [facility] Payer: [facility] [employer] Medication & CPT/J Code(s) submitted: B7169 FERAHEME Route of submission (phone, fax, portal):  Phone # Fax # Auth type: Buy/Bill Units/visits requested: 2 DOSES Reference number:  Approval from: 02/05/23 to 06/08/23

## 2023-02-06 ENCOUNTER — Other Ambulatory Visit (HOSPITAL_COMMUNITY)
Admission: RE | Admit: 2023-02-06 | Discharge: 2023-02-06 | Disposition: A | Discharge status: Home / Self Care | Payer: Medicaid Other | Source: Ambulatory Visit | Attending: Family Medicine | Admitting: Family Medicine

## 2023-02-06 ENCOUNTER — Telehealth: Payer: Self-pay

## 2023-02-06 ENCOUNTER — Telehealth: Payer: Self-pay | Admitting: Family Medicine

## 2023-02-06 ENCOUNTER — Ambulatory Visit (INDEPENDENT_AMBULATORY_CARE_PROVIDER_SITE_OTHER): Payer: Medicaid Other | Admitting: Family Medicine

## 2023-02-06 ENCOUNTER — Encounter: Payer: Self-pay | Admitting: Family Medicine

## 2023-02-06 VITALS — BP 147/94 | HR 82 | Ht 63.0 in | Wt 193.0 lb

## 2023-02-06 DIAGNOSIS — I10 Essential (primary) hypertension: Secondary | ICD-10-CM | POA: Diagnosis not present

## 2023-02-06 DIAGNOSIS — N946 Dysmenorrhea, unspecified: Secondary | ICD-10-CM | POA: Diagnosis not present

## 2023-02-06 DIAGNOSIS — F439 Reaction to severe stress, unspecified: Secondary | ICD-10-CM | POA: Diagnosis not present

## 2023-02-06 DIAGNOSIS — N898 Other specified noninflammatory disorders of vagina: Secondary | ICD-10-CM | POA: Insufficient documentation

## 2023-02-06 DIAGNOSIS — A599 Trichomoniasis, unspecified: Secondary | ICD-10-CM

## 2023-02-06 DIAGNOSIS — A5901 Trichomonal vulvovaginitis: Secondary | ICD-10-CM | POA: Diagnosis not present

## 2023-02-06 LAB — POCT WET PREP (WET MOUNT): Clue Cells Wet Prep Whiff POC: NEGATIVE

## 2023-02-06 MED ORDER — AMLODIPINE BESYLATE 5 MG PO TABS
5.0000 mg | ORAL_TABLET | Freq: Every day | ORAL | 3 refills | Status: DC
Start: 2023-02-06 — End: 2023-03-02

## 2023-02-06 MED ORDER — METRONIDAZOLE 500 MG PO TABS
500.0000 mg | ORAL_TABLET | Freq: Two times a day (BID) | ORAL | 0 refills | Status: AC
Start: 2023-02-06 — End: 2023-02-13

## 2023-02-06 MED ORDER — NAPROXEN 500 MG PO TABS
500.0000 mg | ORAL_TABLET | Freq: Two times a day (BID) | ORAL | 0 refills | Status: DC | PRN
Start: 2023-02-06 — End: 2023-03-25

## 2023-02-06 NOTE — Patient Instructions (Addendum)
Good to see you today - Thank you for coming in  Things we discussed today:  1) For your vaginal itching, we checked some labs today. - If there are any abnormal results, I will give you a call - If the results are normal, then I will send a MyChart message  2) For your stress and high blood pressure, - start taking Amlodipine 5mg  once a day.  3) I have refilled your Naproxen. If you are needing to take this for 7 days in a row, then take a break for 7 days, then you can use it again for another 7 days if needed. Try to avoid using it too much as it can injure your stomach and kidneys  Please always bring your medication bottles  Come back to see me in 1-2 weeks to recheck your blood pressure and follow-up on your stress.

## 2023-02-06 NOTE — Progress Notes (Unsigned)
    SUBJECTIVE:   CHIEF COMPLAINT / HPI:   [name] is a 42yo F w/ hx of IDA that p/w vaginal itching.  Vaginal Discharge - At beginning of the week, pt started to have vaginal itching.  - Wants to get STI testing - Declines blood draw today. - Denies dysuria or fevers  Period Cramps - needs naproxen refill for period cramps. Only takes this when she's on her period.  Elevated BP  Stress - Had high BP at [place] appointment. Reports a lot of life stressors recently and feels like this is causing her BP. Denies drinking any caffeine drinks prior to this visit.  OBJECTIVE:   BP (!) 147/94   Pulse 82   Ht 5\' 3"  (1.6 m)   Wt 193 lb (87.5 kg)   SpO2 100%   BMI 34.19 kg/m   General: Alert, pleasant woman. NAD. HEENT: NCAT. MMM. CV: RRR, no murmurs. Resp: CTAB, no wheezing or crackles. Normal WOB on RA.  Abm: Soft, nontender, nondistended. Ext: Moves all ext spontaneously Skin: Warm, well perfused  GU: Thin, clear discharge in vaginal vault on speculum exam  ASSESSMENT/PLAN:   Trichomonas vaginitis ~1wk hx of vaginal itching. Vaginal swab and wet prep positive for Trichomonas infxn. - Flagyl 500BID x7days - Advised to avoid intercourse while finishing treatment - Consider HIV and RPR at f/u  Dysmenorrhea - Naproxen refill provided. Discussed avoiding prolonged use.  Hypertension On chart review, pt has had had consistently elevated BP's this year documented. Was high on repeat today. Pt agreeable to starting a medication. - Start amlodipine 5mg  daily - f/u in 2 weeks  Stress Reports a lot of life stressors recently, thinks this may be contributuing to her elevated BP (see below).    F/u Recs - f/u BP - check HIV, RPR - GAD7, PHQ9  Lincoln Brigham, MD [facility]

## 2023-02-06 NOTE — Telephone Encounter (Signed)
-----   Message from Desma Mcgregor sent at 02/06/2023 12:13 PM EDT ----- Will do, thank you ----- Message ----- From: Missy Sabins, RN Sent: 02/06/2023  12:03 PM EDT To: Kandra Nicolas, RN; Amalia Greenhouse, CPhT; #  That will be fine, if covered by patient's insurance. Thank you! ----- Message ----- From: Desma Mcgregor, [facility] Sent: 02/06/2023  11:36 AM EDT To: Kandra Nicolas, RN; Amalia Greenhouse, CPhT; #  Hi Team,   We have see some increased incidence of infusion reactions with ferrlecit, would you be amenable to change to venofer? If agreeable, we can update the orders to venofer 200mg  qweek x5.  Thanks, [facility]

## 2023-02-06 NOTE — Telephone Encounter (Signed)
Call pt to inform that she was positive for Trich. Sent Flagyl 500mg  BID for 7 days.

## 2023-02-08 DIAGNOSIS — A5901 Trichomonal vulvovaginitis: Secondary | ICD-10-CM | POA: Insufficient documentation

## 2023-02-08 DIAGNOSIS — I10 Essential (primary) hypertension: Secondary | ICD-10-CM | POA: Insufficient documentation

## 2023-02-08 DIAGNOSIS — F439 Reaction to severe stress, unspecified: Secondary | ICD-10-CM

## 2023-02-08 HISTORY — DX: Reaction to severe stress, unspecified: F43.9

## 2023-02-08 HISTORY — DX: Trichomonal vulvovaginitis: A59.01

## 2023-02-08 NOTE — Assessment & Plan Note (Addendum)
Reports a lot of life stressors recently, thinks this may be contributuing to her elevated BP (see above) and causing her anxiety. - Repeat PHQ-9 and GAD7 at f/u. This may be adjustment disorder given recent life stressors. Consider therapy vs starting SSRI.

## 2023-02-08 NOTE — Assessment & Plan Note (Signed)
-   Naproxen refill provided. Discussed avoiding prolonged use.

## 2023-02-08 NOTE — Assessment & Plan Note (Signed)
On chart review, pt has had had consistently elevated BP's this year documented. Was high on repeat today. Pt agreeable to starting a medication. - Start amlodipine 5mg  daily - f/u in 2 weeks

## 2023-02-08 NOTE — Assessment & Plan Note (Signed)
~  1wk hx of vaginal itching. Vaginal swab and wet prep positive for Trichomonas infxn. - Flagyl 500BID x7days - Advised to avoid intercourse while finishing treatment - Consider HIV and RPR at f/u

## 2023-02-13 ENCOUNTER — Ambulatory Visit (INDEPENDENT_AMBULATORY_CARE_PROVIDER_SITE_OTHER): Payer: Medicaid Other

## 2023-02-13 VITALS — BP 119/70 | HR 84 | Temp 99.2°F | Resp 20 | Ht 68.0 in | Wt 187.0 lb

## 2023-02-13 DIAGNOSIS — K625 Hemorrhage of anus and rectum: Secondary | ICD-10-CM | POA: Diagnosis not present

## 2023-02-13 DIAGNOSIS — D500 Iron deficiency anemia secondary to blood loss (chronic): Secondary | ICD-10-CM | POA: Diagnosis not present

## 2023-02-13 MED ORDER — DIPHENHYDRAMINE HCL 25 MG PO CAPS
25.0000 mg | ORAL_CAPSULE | Freq: Once | ORAL | Status: AC
  Administered 2023-02-13: 25 mg via ORAL
  Filled 2023-02-13: qty 1

## 2023-02-13 MED ORDER — ACETAMINOPHEN 325 MG PO TABS
650.0000 mg | ORAL_TABLET | Freq: Once | ORAL | Status: AC
  Administered 2023-02-13: 650 mg via ORAL
  Filled 2023-02-13: qty 2

## 2023-02-13 MED ORDER — SODIUM CHLORIDE 0.9 % IV SOLN
510.0000 mg | Freq: Once | INTRAVENOUS | Status: AC
  Administered 2023-02-13: 510 mg via INTRAVENOUS
  Filled 2023-02-13: qty 17

## 2023-02-13 NOTE — Progress Notes (Signed)
Diagnosis: Iron Deficiency Anemia  Provider:  Chilton Greathouse MD  Procedure: IV Infusion  IV Type: Peripheral, IV Location: R Antecubital  Feraheme (Ferumoxytol), Dose: 510 mg  Infusion Start Time: 1444  Infusion Stop Time: 1459  Post Infusion IV Care: Observation period completed. PIV removed  Discharge: Condition: Good, Destination: Home . AVS Provided  Performed by:  Loney Hering, LPN

## 2023-02-20 ENCOUNTER — Ambulatory Visit: Payer: Medicaid Other | Admitting: Student

## 2023-02-20 ENCOUNTER — Ambulatory Visit (INDEPENDENT_AMBULATORY_CARE_PROVIDER_SITE_OTHER): Payer: Medicaid Other | Admitting: *Deleted

## 2023-02-20 VITALS — BP 133/78 | HR 88 | Temp 98.5°F | Resp 18 | Ht 63.0 in | Wt 187.2 lb

## 2023-02-20 DIAGNOSIS — K625 Hemorrhage of anus and rectum: Secondary | ICD-10-CM

## 2023-02-20 DIAGNOSIS — D500 Iron deficiency anemia secondary to blood loss (chronic): Secondary | ICD-10-CM

## 2023-02-20 MED ORDER — ACETAMINOPHEN 325 MG PO TABS: 650.0000 mg | ORAL_TABLET | Freq: Once | ORAL | Status: DC

## 2023-02-20 MED ORDER — SODIUM CHLORIDE 0.9 % IV SOLN
510.0000 mg | Freq: Once | INTRAVENOUS | Status: AC
  Administered 2023-02-20: 510 mg via INTRAVENOUS
  Filled 2023-02-20: qty 17

## 2023-02-20 MED ORDER — DIPHENHYDRAMINE HCL 25 MG PO CAPS: 25.0000 mg | ORAL_CAPSULE | Freq: Once | ORAL | Status: DC

## 2023-02-20 NOTE — Addendum Note (Signed)
Addended by: Governor Specking A on: 02/20/2023 01:43 PM   Modules accepted: Orders

## 2023-02-20 NOTE — Progress Notes (Deleted)
    SUBJECTIVE:   CHIEF COMPLAINT / HPI:   ***  PERTINENT  PMH / PSH: ***  OBJECTIVE:   There were no vitals taken for this visit. ***   ASSESSMENT/PLAN:   No problem-specific Assessment & Plan notes found for this encounter.     [name], [name]    {    This will disappear when note is signed, click to select method of visit    :1}

## 2023-02-20 NOTE — Progress Notes (Signed)
Diagnosis: Iron Deficiency Anemia  Provider:  Chilton Greathouse MD  Procedure: IV Infusion  IV Type: Peripheral, IV Location: R Antecubital  Feraheme (Ferumoxytol), Dose: 510 mg  Infusion Start Time: 1318 pm  Infusion Stop Time: 1335 pm  Post Infusion IV Care: Observation period completed and Peripheral IV Discontinued  Discharge: Condition: Good, Destination: Home . AVS Provided  Performed by:  Forrest Moron, RN

## 2023-03-02 ENCOUNTER — Encounter: Payer: Self-pay | Admitting: Student

## 2023-03-02 ENCOUNTER — Ambulatory Visit (INDEPENDENT_AMBULATORY_CARE_PROVIDER_SITE_OTHER): Payer: Medicaid Other | Admitting: Student

## 2023-03-02 VITALS — BP 142/82 | HR 74 | Ht 63.0 in | Wt 187.0 lb

## 2023-03-02 DIAGNOSIS — F439 Reaction to severe stress, unspecified: Secondary | ICD-10-CM

## 2023-03-02 DIAGNOSIS — A5901 Trichomonal vulvovaginitis: Secondary | ICD-10-CM | POA: Diagnosis not present

## 2023-03-02 DIAGNOSIS — I10 Essential (primary) hypertension: Secondary | ICD-10-CM | POA: Diagnosis present

## 2023-03-02 DIAGNOSIS — Z113 Encounter for screening for infections with a predominantly sexual mode of transmission: Secondary | ICD-10-CM

## 2023-03-02 MED ORDER — AMLODIPINE BESYLATE 10 MG PO TABS: 10.0000 mg | ORAL_TABLET | Freq: Every day | ORAL | 3 refills | Status: DC

## 2023-03-02 MED ORDER — MELATONIN 3 MG PO TABS: 3.0000 mg | ORAL_TABLET | Freq: Every day | ORAL | 0 refills | Status: DC

## 2023-03-02 NOTE — Progress Notes (Signed)
    SUBJECTIVE:   CHIEF COMPLAINT / HPI:   [name] is a 42 year old female here to follow-up on blood pressure.  STI screening Diagnosed with trichomonas at visit on 02/06/2023.  Completed her treatment course with Flagyl. Vaginal itching since improved.  Hypertension Taking amlodipine 5 mg daily. Thinks a lot of her elevated blood pressure was contributed to life stressors (financial issues, relationship issues- breakup). 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  PERTINENT  PMH / PSH:   OBJECTIVE:   BP (!) 142/82   Pulse 74   Ht 5\' 3"  (1.6 m)   Wt 187 lb (84.8 kg)   LMP 02/16/2023   SpO2 98%   BMI 33.13 kg/m      ASSESSMENT/PLAN:   No problem-specific Assessment & Plan notes found for this encounter.     Darral Dash, DO [facility]    {    This will disappear when note is signed, click to select method of visit    :1}

## 2023-03-02 NOTE — Patient Instructions (Addendum)
It was great seeing you today.  As we discussed, - You can try taking "Doxylamine" (Unisom) at night to help with sleeping. Also, I sent in Melatonin to your pharmacy as well - Therapy is very helpful thing for helping cope with stress- see the resources below. - Start taking Amlodipine 10 mg daily. You may finish your current supply with 2 tablets daily until you run out. Then, take 1 tablet of Amlodipine 10 mg daily.   If you have any questions or concerns, please feel free to call the clinic.   Have a wonderful day,  Dr. Darral Dash [facility] [phone]     Psychiatry Resource List (Adults and Children) Most of these providers will take Medicaid. please consult your insurance for a complete and updated list of available providers. When calling to make an appointment have your insurance information available to confirm you are covered.   [facility]Psychiatry and Counseling [address] [place], [place] [address] [phone]  [facility]  [address] [place], [place] [phone] Crisis [phone]   [facility]:   [facility]: [address]     [phone]   [facility]: [address] [place]. [place],        [phone] [place]: [phone],    [phone] [place]: [phone] [address],                   [phone] Children: [facility] [address]         [phone]  [facility] (virtual only) [phone]   [facility]  (Psychiatry only; Adults /children 12 and over, will take Medicaid)  [address] Laurell Josephs [address], [place], [place] [address]       [phone]   [facility] (Psychiatry & counseling ; adults & children ; will take Medicaid [address]  [place] [address]  Go on-line to complete referral (  [email or web address]://[email or web address].org/en/make-[email or web address]-referral [phone]    (Spanish speaking therapists)  [facility]  Psychiatry & counseling; Adults and children;  Call Registration prior to scheduling an appointment [phone] [address]    [place], [place] [address]    [phone]  [facility] (Psychiatry & counseling; adults & children; Medicare no Medicaid)  [address]   [place], [place]  [address]      [phone]    [facility] (up to age 44)  Psychiatry & counseling ,will take Medicaid, must do counseling to receive psychiatry services  [address] [place] [address]        [phone]  [facility] (Psychiatry & counseling; adults & children; will take Medicaid) Will need a referral from provider [address],  [place], [place]  [phone]   [facility] --- Walk-In Mon-Friday 8am-3pm ( will take Medicaid, Psychiatry, Adults & children,  [address] [place], [place]   [phone]   [facility], Walk-in M-F 8am-12pm and 1pm -3pm   (Counseling, Psychiatry, will take Medicaid, adults & children)  [address], [place], [place]  [phone]

## 2023-03-03 ENCOUNTER — Telehealth: Payer: Self-pay | Admitting: Gastroenterology

## 2023-03-03 DIAGNOSIS — Z113 Encounter for screening for infections with a predominantly sexual mode of transmission: Secondary | ICD-10-CM | POA: Insufficient documentation

## 2023-03-03 HISTORY — DX: Encounter for screening for infections with a predominantly sexual mode of transmission: Z11.3

## 2023-03-03 LAB — RPR: RPR Ser Ql: NONREACTIVE

## 2023-03-03 LAB — HIV ANTIBODY (ROUTINE TESTING W REFLEX): HIV Screen 4th Generation wRfx: NONREACTIVE

## 2023-03-03 NOTE — Assessment & Plan Note (Signed)
Collected HIV and RPR screening today given she had trichomonal infection

## 2023-03-03 NOTE — Telephone Encounter (Signed)
Returned call to patient. Her phone goes straight to vm. I left patient a detailed vm with 02/03/23 lab results and recommendations. Pt already received IV iron and has been advised that we will recheck her blood counts about 3 weeks after that. Pt did call to reschedule Endo/colon to a more convenient time for her. I informed patient that she reviewed MyChart message with recommendations and I did not have any additional recommendations at this time. Pt advised to call back if she has any questions or concerns.

## 2023-03-03 NOTE — Assessment & Plan Note (Signed)
Unfortunately, hypertension still with blood pressure out of goal range.  Initially 130s/70s, repeat 142/82 Will increase amlodipine to 10 mg daily.  She may take 2 tablets 5 mg daily until she runs out of current supply and then start 10 mg tablet once daily. Follow-up in 1 month Goal blood pressure 130/90 or less Discussed tobacco cessation as well

## 2023-03-03 NOTE — Assessment & Plan Note (Signed)
Continues to have major life stressors.  No SI or HI. Provided with therapy resources and discussed benefit of talk therapy Follow-up in 1 month

## 2023-03-03 NOTE — Telephone Encounter (Signed)
Patient is returning your call.  

## 2023-03-13 ENCOUNTER — Telehealth: Payer: Self-pay

## 2023-03-13 DIAGNOSIS — D500 Iron deficiency anemia secondary to blood loss (chronic): Secondary | ICD-10-CM

## 2023-03-13 NOTE — Telephone Encounter (Signed)
-----   Message from Nurse [place] P sent at 02/12/2023 10:13 AM EDT ----- Regarding: Labs due CBC due - need to order

## 2023-03-13 NOTE — Telephone Encounter (Signed)
Called and left patient a detailed vm with lab reminder. I advised of lab location and hours, no appt needed. I informed pt that I will send this information via MyChart. Pt was advised to call or send message if she has any questions or concerns.   Lab order in epic. MyChart message sent to patient.

## 2023-03-17 NOTE — Telephone Encounter (Signed)
Letter mailed to patient with lab reminder.

## 2023-03-23 ENCOUNTER — Other Ambulatory Visit: Payer: Self-pay | Admitting: Family Medicine

## 2023-03-23 DIAGNOSIS — N946 Dysmenorrhea, unspecified: Secondary | ICD-10-CM

## 2023-03-25 ENCOUNTER — Telehealth: Payer: Self-pay

## 2023-03-25 NOTE — Telephone Encounter (Signed)
Patient LVM on nurse line requesting birth control change.   I attempted to call patient back to gather more information.  However, no answer. VM left to return my call.

## 2023-03-27 ENCOUNTER — Encounter: Payer: Medicaid Other | Admitting: Gastroenterology

## 2023-03-30 ENCOUNTER — Encounter: Payer: Self-pay | Admitting: Gastroenterology

## 2023-03-30 ENCOUNTER — Telehealth: Payer: Self-pay

## 2023-03-30 ENCOUNTER — Other Ambulatory Visit (INDEPENDENT_AMBULATORY_CARE_PROVIDER_SITE_OTHER): Payer: Medicaid Other

## 2023-03-30 ENCOUNTER — Ambulatory Visit (AMBULATORY_SURGERY_CENTER): Payer: Medicaid Other | Admitting: Gastroenterology

## 2023-03-30 VITALS — BP 154/98 | HR 53 | Temp 98.6°F | Resp 16 | Ht 63.0 in | Wt 187.0 lb

## 2023-03-30 DIAGNOSIS — K2950 Unspecified chronic gastritis without bleeding: Secondary | ICD-10-CM | POA: Diagnosis not present

## 2023-03-30 DIAGNOSIS — D500 Iron deficiency anemia secondary to blood loss (chronic): Secondary | ICD-10-CM

## 2023-03-30 DIAGNOSIS — K625 Hemorrhage of anus and rectum: Secondary | ICD-10-CM | POA: Diagnosis not present

## 2023-03-30 DIAGNOSIS — D509 Iron deficiency anemia, unspecified: Secondary | ICD-10-CM

## 2023-03-30 DIAGNOSIS — K449 Diaphragmatic hernia without obstruction or gangrene: Secondary | ICD-10-CM

## 2023-03-30 DIAGNOSIS — K2970 Gastritis, unspecified, without bleeding: Secondary | ICD-10-CM

## 2023-03-30 DIAGNOSIS — I10 Essential (primary) hypertension: Secondary | ICD-10-CM | POA: Diagnosis not present

## 2023-03-30 DIAGNOSIS — F419 Anxiety disorder, unspecified: Secondary | ICD-10-CM | POA: Diagnosis not present

## 2023-03-30 HISTORY — PX: COLONOSCOPY WITH ESOPHAGOGASTRODUODENOSCOPY (EGD): SHX5779

## 2023-03-30 LAB — CBC WITH DIFFERENTIAL/PLATELET
Basophils Absolute: 0.1 10*3/uL (ref 0.0–0.1)
Basophils Relative: 1.4 % (ref 0.0–3.0)
Eosinophils Absolute: 0.1 10*3/uL (ref 0.0–0.7)
Eosinophils Relative: 1.4 % (ref 0.0–5.0)
HCT: 37.1 % (ref 36.0–46.0)
Hemoglobin: 12.1 g/dL (ref 12.0–15.0)
Lymphocytes Relative: 32.9 % (ref 12.0–46.0)
Lymphs Abs: 2.1 10*3/uL (ref 0.7–4.0)
MCHC: 32.5 g/dL (ref 30.0–36.0)
MCV: 94 fl (ref 78.0–100.0)
Monocytes Absolute: 0.4 10*3/uL (ref 0.1–1.0)
Monocytes Relative: 7 % (ref 3.0–12.0)
Neutro Abs: 3.7 10*3/uL (ref 1.4–7.7)
Neutrophils Relative %: 57.3 % (ref 43.0–77.0)
Platelets: 233 10*3/uL (ref 150.0–400.0)
RBC: 3.95 Mil/uL (ref 3.87–5.11)
RDW: 20.4 % — ABNORMAL HIGH (ref 11.5–15.5)
WBC: 6.4 10*3/uL (ref 4.0–10.5)

## 2023-03-30 MED ORDER — OMEPRAZOLE 20 MG PO CPDR: 20.0000 mg | DELAYED_RELEASE_CAPSULE | Freq: Every day | ORAL | 3 refills | Status: DC

## 2023-03-30 MED ORDER — SODIUM CHLORIDE 0.9 % IV SOLN: 500.0000 mL | Freq: Once | INTRAVENOUS | Status: DC

## 2023-03-30 NOTE — Op Note (Signed)
[facility] Patient Name: [name] Procedure Date: 03/30/2023 3:15 PM MRN: [id number] Endoscopist: Viviann Spare P. Adela Lank , MD, [id number] Age: 42 Referring MD:  Date of Birth: May 24, 1981 Gender: Female Account #: [id number] Procedure:                Upper GI endoscopy Indications:              Iron deficiency anemia Medicines:                Monitored Anesthesia Care Procedure:                Pre-Anesthesia Assessment:                           - Prior to the procedure, a History and Physical                            was performed, and patient medications and                            allergies were reviewed. The patient's tolerance of                            previous anesthesia was also reviewed. The risks                            and benefits of the procedure and the sedation                            options and risks were discussed with the patient.                            All questions were answered, and informed consent                            was obtained. Prior Anticoagulants: The patient has                            taken no anticoagulant or antiplatelet agents. ASA                            Grade Assessment: II - A patient with mild systemic                            disease. After reviewing the risks and benefits,                            the patient was deemed in satisfactory condition to                            undergo the procedure.                           After obtaining informed consent, the endoscope was  passed under direct vision. Throughout the                            procedure, the patient's blood pressure, pulse, and                            oxygen saturations were monitored continuously. The                            Olympus Scope [id number] was introduced through the                            mouth, and advanced to the second part of duodenum.                            The upper GI endoscopy was  accomplished without                            difficulty. The patient tolerated the procedure                            well. Scope In: Scope Out: Findings:                 Esophagogastric landmarks were identified: the                            Z-line was found at 39 cm, the gastroesophageal                            junction was found at 39 cm and the upper extent of                            the gastric folds was found at 40 cm from the                            incisors.                           A 1 cm hiatal hernia was present.                           The exam of the esophagus was otherwise normal.                           Patchy erythematous mucosa / gastritis was found in                            the gastric body and in the gastric antrum.                            Biopsies were taken with a cold forceps for  Helicobacter pylori testing.                           The exam of the stomach was otherwise normal.                           The examined duodenum was normal. Complications:            No immediate complications. Estimated blood loss:                            Minimal. Estimated Blood Loss:     Estimated blood loss was minimal. Impression:               - Esophagogastric landmarks identified.                           - 1 cm hiatal hernia.                           - Normal esophagus otherwise.                           - Erythematous mucosa / gastritis in the gastric                            body and antrum. Biopsied.                           - Normal stomach otherwise.                           - Normal examined duodenum. Recommendation:           - Patient has a contact number available for                            emergencies. The signs and symptoms of potential                            delayed complications were discussed with the                            patient. Return to normal activities tomorrow.                             Written discharge instructions were provided to the                            patient.                           - Resume previous diet.                           - Continue present medications.                           -  Recommend omeprazole 20mg  / day for 30 days to                            treat gastritis                           - Minimize NSAID use.                           - Await pathology results.                           - Go to the lab for CBC to make sure blood counts                            stable post IV iron infusion Mickell Birdwell P. Chenee Munns, MD 03/30/2023 3:56:28 PM This report has been signed electronically.

## 2023-03-30 NOTE — Progress Notes (Signed)
[facility] History and Physical   Primary Care Physician:  Darral Dash, DO   Reason for Procedure:   Iron deficiency anemia, rectal bleeding  Plan:    EGD and colonoscopy   HPI: [name] is a 42 y.o. female  here for EGD and colonoscopy to evaluate issues as above. She has had longstanding hemorrhoids, with rectal bleeding, likely etiology for her symptoms but colonoscopy to exclude other causes given her age. Associated with rectal bleeding has been IDA, recently referred for IV iron. EGD added to clear her upper tract in light of age and IDA. Marland Kitchen  Otherwise feels well without any cardiopulmonary symptoms.   I have discussed risks / benefits of anesthesia and endoscopic procedure with [name] and they wish to proceed with the exams as outlined today.    Past Medical History:  Diagnosis Date   Allergy    Anxiety    Cough 03/21/2016   no fever, states coughing up "snot colored cold stuff"-  has had this x 1.5 weeks and is getting much better- taking tylenol cold   Depression    never been treated , "probably got some "   Gall stones    GERD without esophagitis 05/16/2014   Headache(784.0)    Hemorrhoids    S/P cesarean section 11/03/2014   Screening examination for STD (sexually transmitted disease) 12/11/2016   Tooth ache    right upper molar partially broke off    Past Surgical History:  Procedure Laterality Date   CESAREAN SECTION     CESAREAN SECTION WITH BILATERAL TUBAL LIGATION Bilateral 11/03/2014   Procedure: CESAREAN SECTION WITH BILATERAL TUBAL LIGATION;  Surgeon: Brock Bad, MD;  Location: [facility];  Service: Obstetrics;  Laterality: Bilateral;   CHOLECYSTECTOMY     CHOLECYSTECTOMY, LAPAROSCOPIC     DILATION AND CURETTAGE OF UTERUS     INCISIONAL HERNIA REPAIR N/A 03/27/2016   Procedure: LAPAROSCOPIC ASSISTED REPAIR OF INCARCERATED  INCISIONAL HERNIA;  Surgeon: Gaynelle Adu, MD;  Location: [facility];  Service: General;  Laterality: N/A;    INSERTION OF MESH N/A 03/27/2016   Procedure: INSERTION OF MESH;  Surgeon: Gaynelle Adu, MD;  Location: [facility];  Service: General;  Laterality: N/A;   THERAPEUTIC ABORTION     TUBAL LIGATION      Prior to Admission medications   Medication Sig Start Date End Date Taking? Authorizing Provider  amLODipine (NORVASC) 10 MG tablet Take 1 tablet (10 mg total) by mouth at bedtime. 03/02/23  Yes Dameron, Nolberto Hanlon, DO  naproxen (NAPROSYN) 500 MG tablet TAKE 1 TABLET (500 MG TOTAL) BY MOUTH 2 (TWO) TIMES DAILY AS NEEDED (MENSTRUAL CRAMPS). 03/25/23  Yes Dameron, Nolberto Hanlon, DO  fluticasone (FLONASE) 50 MCG/ACT nasal spray Place 1 spray into both nostrils daily. 09/11/21   Mardella Layman, MD  melatonin 3 MG TABS tablet Take 1 tablet (3 mg total) by mouth at bedtime. 03/02/23   Dameron, Nolberto Hanlon, DO  phenylephrine-shark liver oil-mineral oil-petrolatum (PREPARATION H) 0.25-14-74.9 % rectal ointment Place 1 Application rectally 2 (two) times daily as needed for hemorrhoids. 05/07/22   Dameron, Nolberto Hanlon, DO  polyethylene glycol powder (GLYCOLAX/MIRALAX) 17 GM/SCOOP powder Take 17 g by mouth daily. 05/07/22   Dameron, Nolberto Hanlon, DO  Rectal Protectant-Emollient (CALMOL-4) 76-10 % SUPP Use as directed once to twice daily 02/03/23   Twala Collings, Willaim Rayas, MD    Current Outpatient Medications  Medication Sig Dispense Refill   amLODipine (NORVASC) 10 MG tablet Take 1 tablet (10 mg total) by mouth  at bedtime. 90 tablet 3   naproxen (NAPROSYN) 500 MG tablet TAKE 1 TABLET (500 MG TOTAL) BY MOUTH 2 (TWO) TIMES DAILY AS NEEDED (MENSTRUAL CRAMPS). 30 tablet 0   fluticasone (FLONASE) 50 MCG/ACT nasal spray Place 1 spray into both nostrils daily. 18.2 mL 2   melatonin 3 MG TABS tablet Take 1 tablet (3 mg total) by mouth at bedtime. 90 tablet 0   phenylephrine-shark liver oil-mineral oil-petrolatum (PREPARATION H) 0.25-14-74.9 % rectal ointment Place 1 Application rectally 2 (two) times daily as needed for hemorrhoids. 56 g 0   polyethylene  glycol powder (GLYCOLAX/MIRALAX) 17 GM/SCOOP powder Take 17 g by mouth daily. 500 g 0   Rectal Protectant-Emollient (CALMOL-4) 76-10 % SUPP Use as directed once to twice daily     Current Facility-Administered Medications  Medication Dose Route Frequency Provider Last Rate Last Admin   0.9 %  sodium chloride infusion  500 mL Intravenous Once Aarthi Uyeno, Willaim Rayas, MD        Allergies as of 03/30/2023 - Review Complete 03/30/2023  Allergen Reaction Noted   Levofloxacin Itching     Family History  Problem Relation Age of Onset   Diabetes Father    Hypertension Father    Hyperlipidemia Father    Cancer Maternal Aunt        breast   Hypertension Paternal Grandmother    Stroke Paternal Grandmother    Stomach cancer Neg Hx    Esophageal cancer Neg Hx    Colon cancer Neg Hx    Rectal cancer Neg Hx     Social History   Socioeconomic History   Marital status: Single    Spouse name: Not on file   Number of children: Not on file   Years of education: Not on file   Highest education level: Not on file  Occupational History   Not on file  Tobacco Use   Smoking status: Former    Current packs/day: 0.00    Types: Cigarettes    Quit date: 07/15/2015    Years since quitting: 7.7   Smokeless tobacco: Never   Tobacco comments:    prior to preg  Vaping Use   Vaping status: Never Used  Substance and Sexual Activity   Alcohol use: No    Alcohol/week: 0.0 standard drinks of alcohol   Drug use: Yes    Types: Marijuana   Sexual activity: Yes    Birth control/protection: Surgical    Comment: BTL  Other Topics Concern   Not on file  Social History Narrative   Not on file   Social Determinants of Health   Financial Resource Strain: Not on file  Food Insecurity: Not on file  Transportation Needs: Not on file  Physical Activity: Not on file  Stress: Not on file  Social Connections: Not on file  Intimate Partner Violence: Not on file    Review of Systems: All other review of  systems negative except as mentioned in the HPI.  Physical Exam: Vital signs BP 126/75   Pulse 73   Temp 98.6 F (37 C)   Ht 5\' 3"  (1.6 m)   Wt 187 lb (84.8 kg)   LMP 03/30/2023   SpO2 100%   BMI 33.13 kg/m   General:   Alert,  Well-developed, pleasant and cooperative in NAD Lungs:  Clear throughout to auscultation.   Heart:  Regular rate and rhythm Abdomen:  Soft, nontender and nondistended.   Neuro/Psych:  Alert and cooperative. Normal mood and affect. A and  O x 3  Harlin Rain, MD [facility]

## 2023-03-30 NOTE — Op Note (Signed)
[facility] Patient Name: [name] Procedure Date: 03/30/2023 3:14 PM MRN: [id number] Endoscopist: Viviann Spare P. Adela Lank , MD, [id number] Age: 42 Referring MD:  Date of Birth: 30-Jul-1980 Gender: Female Account #: [id number] Procedure:                Colonoscopy Indications:              Rectal bleeding, Iron deficiency anemia, recently                            given IV iron infusions Medicines:                Monitored Anesthesia Care Procedure:                Pre-Anesthesia Assessment:                           - Prior to the procedure, a History and Physical                            was performed, and patient medications and                            allergies were reviewed. The patient's tolerance of                            previous anesthesia was also reviewed. The risks                            and benefits of the procedure and the sedation                            options and risks were discussed with the patient.                            All questions were answered, and informed consent                            was obtained. Prior Anticoagulants: The patient has                            taken no anticoagulant or antiplatelet agents. ASA                            Grade Assessment: II - A patient with mild systemic                            disease. After reviewing the risks and benefits,                            the patient was deemed in satisfactory condition to                            undergo the procedure.  After obtaining informed consent, the colonoscope                            was passed under direct vision. Throughout the                            procedure, the patient's blood pressure, pulse, and                            oxygen saturations were monitored continuously. The                            Olympus [id number]-[id number] (#[id number]) Colonoscope was                            introduced through the anus and  advanced to the the                            terminal ileum, with identification of the                            appendiceal orifice and IC valve. The colonoscopy                            was performed without difficulty. The patient                            tolerated the procedure well. The quality of the                            bowel preparation was good. The terminal ileum,                            ileocecal valve, appendiceal orifice, and rectum                            were photographed. Scope In: 3:29:18 PM Scope Out: 3:46:57 PM Scope Withdrawal Time: 0 hours 14 minutes 19 seconds  Total Procedure Duration: 0 hours 17 minutes 39 seconds  Findings:                 Hemorrhoids were found on perianal exam.                           The terminal ileum appeared normal.                           Internal hemorrhoids were found.                           The exam was otherwise without abnormality.                            Residual liquid stool noted throughout the colon  which was lavaged with adequate views. Complications:            No immediate complications. Estimated blood loss:                            None. Estimated Blood Loss:     Estimated blood loss: none. Impression:               - Hemorrhoids found on perianal exam.                           - The examined portion of the ileum was normal.                           - Internal hemorrhoids.                           - The examination was otherwise normal.                           Hemorrhoids are the cause of rectal bleeding. Recommendation:           - Patient has a contact number available for                            emergencies. The signs and symptoms of potential                            delayed complications were discussed with the                            patient. Return to normal activities tomorrow.                            Written discharge instructions were  provided to the                            patient.                           - Resume previous diet.                           - Continue present medications.                           - Repeat colonoscopy in 10 years for screening                            purposes.                           - Consideration for hemorrhoid banding vs. surgical                            evaluation to treat hemorrhoids. I do think she is  a candidate for hemorrhoid banding based on exam                            today (grade III hemorrhoids). Will discuss with                            the patient                           - Go to the lab for blood draw to make sure blood                            counts are stable post IV iron infusion Neldon Shepard P. Charnetta Wulff, MD 03/30/2023 3:52:34 PM This report has been signed electronically.

## 2023-03-30 NOTE — Telephone Encounter (Signed)
-----   Message from Benancio Deeds sent at 03/30/2023  4:03 PM EDT ----- Regarding: another hemorrhoid banding Can you also book this patient for next hemorrhoid banding slot and help coordinate with her? Thanks

## 2023-03-30 NOTE — Patient Instructions (Addendum)
Resume previous diet and medications. Awaiting pathology results. Handout provided on Hemorrhoid banding. Start Omeprazole 20 mg daily for 30 days to treat Gastritis. Minimize NSAID use. GO to the lab for CBC to make sure blood counts are stable post IV iron infusion.   YOU HAD AN ENDOSCOPIC PROCEDURE TODAY AT THE [facility]:   Refer to the procedure report that was given to you for any specific questions about what was found during the examination.  If the procedure report does not answer your questions, please call your gastroenterologist to clarify.  If you requested that your care partner not be given the details of your procedure findings, then the procedure report has been included in a sealed envelope for you to review at your convenience later.  YOU SHOULD EXPECT: Some feelings of bloating in the abdomen. Passage of more gas than usual.  Walking can help get rid of the air that was put into your GI tract during the procedure and reduce the bloating. If you had a lower endoscopy (such as a colonoscopy or flexible sigmoidoscopy) you may notice spotting of blood in your stool or on the toilet paper. If you underwent a bowel prep for your procedure, you may not have a normal bowel movement for a few days.  Please Note:  You might notice some irritation and congestion in your nose or some drainage.  This is from the oxygen used during your procedure.  There is no need for concern and it should clear up in a day or so.  SYMPTOMS TO REPORT IMMEDIATELY:  Following lower endoscopy (colonoscopy or flexible sigmoidoscopy):  Excessive amounts of blood in the stool  Significant tenderness or worsening of abdominal pains  Swelling of the abdomen that is new, acute  Fever of 100F or higher  Following upper endoscopy (EGD)  Vomiting of blood or coffee ground material  New chest pain or pain under the shoulder blades  Painful or persistently difficult swallowing  New shortness of  breath  Fever of 100F or higher  Black, tarry-looking stools  For urgent or emergent issues, a gastroenterologist can be reached at any hour by calling [phone]. Do not use MyChart messaging for urgent concerns.    DIET:  We do recommend a small meal at first, but then you may proceed to your regular diet.  Drink plenty of fluids but you should avoid alcoholic beverages for 24 hours.  ACTIVITY:  You should plan to take it easy for the rest of today and you should NOT DRIVE or use heavy machinery until tomorrow (because of the sedation medicines used during the test).    FOLLOW UP: Our staff will call the number listed on your records the next business day following your procedure.  We will call around 7:15- 8:00 am to check on you and address any questions or concerns that you may have regarding the information given to you following your procedure. If we do not reach you, we will leave a message.     If any biopsies were taken you will be contacted by phone or by letter within the next 1-3 weeks.  Please call us at [phone] if you have not heard about the biopsies in 3 weeks.    SIGNATURES/CONFIDENTIALITY: You and/or your care partner have signed paperwork which will be entered into your electronic medical record.  These signatures attest to the fact that that the information above on your After Visit Summary has been reviewed and is understood.  Full responsibility of the confidentiality of this discharge information lies with you and/or your care-partner.

## 2023-03-30 NOTE — Progress Notes (Signed)
Called to room to assist during endoscopic procedure.  Patient ID and intended procedure confirmed with present staff. Received instructions for my participation in the procedure from the performing physician.  

## 2023-03-30 NOTE — Telephone Encounter (Signed)
Patient has been scheduled for first available hem band slot on 07/21/23 at 3:40 pm with Dr. Adela Lank. Appt information mailed and sent to pt via MyChart.

## 2023-03-30 NOTE — Progress Notes (Signed)
Pt resting comfortably. VSS. Airway intact. SBAR complete to RN. All questions answered.   

## 2023-03-31 ENCOUNTER — Telehealth: Payer: Self-pay

## 2023-03-31 NOTE — Telephone Encounter (Signed)
Follow up call to pt; lm for pt to call if having any difficulty with normal activities or eating and drinking.  RN addressed patient after hours call, encouraging patient to drink small sips of water to rehydrate and walk as much as she can. RN instructed patient that if she has not had improvement, she can either call our office or if the symptoms are severe, go to ED.

## 2023-03-31 NOTE — Telephone Encounter (Signed)
Inbound call from patient, states she is not feeling well, throat is sore and she has been unable to eat. She wishes to speak to a nurse and also get a work note for today.

## 2023-04-01 ENCOUNTER — Encounter: Payer: Self-pay | Admitting: Pharmacist

## 2023-04-03 LAB — SURGICAL PATHOLOGY

## 2023-04-08 ENCOUNTER — Ambulatory Visit (INDEPENDENT_AMBULATORY_CARE_PROVIDER_SITE_OTHER): Payer: Medicaid Other | Admitting: Student

## 2023-04-08 ENCOUNTER — Encounter: Payer: Self-pay | Admitting: Student

## 2023-04-08 ENCOUNTER — Other Ambulatory Visit: Payer: Self-pay

## 2023-04-08 VITALS — BP 152/91 | HR 73 | Ht 64.0 in | Wt 190.4 lb

## 2023-04-08 DIAGNOSIS — Z23 Encounter for immunization: Secondary | ICD-10-CM

## 2023-04-08 DIAGNOSIS — M25559 Pain in unspecified hip: Secondary | ICD-10-CM | POA: Insufficient documentation

## 2023-04-08 DIAGNOSIS — N946 Dysmenorrhea, unspecified: Secondary | ICD-10-CM

## 2023-04-08 MED ORDER — METHYLPREDNISOLONE ACETATE 40 MG/ML IJ SUSP
40.0000 mg | Freq: Once | INTRAMUSCULAR | Status: AC
Start: 2023-04-08 — End: 2023-04-08
  Administered 2023-04-08: 40 mg via INTRAMUSCULAR

## 2023-04-08 NOTE — Assessment & Plan Note (Addendum)
Symptoms most consistent with bursitis/greater trochanteric pain syndrome. Tolerated left greater trochanteric bursa injection well today. Obtained informed consent, and she did not have any contraindications including overlying cellulitis, hypersensitivity or allergy to corticosteroid/lidocaine.  She is not a diabetic. Aftercare instructions provided. She should return to care if pain persists or becomes more severe, fever, signs of infection develop.

## 2023-04-08 NOTE — Patient Instructions (Addendum)
It was great seeing you today.  We gave you a steroid injection for hip bursitis.  What is recovery like after a bursa injection? If the injection had an anesthetic, you should get some pain relief that lasts for a couple of hours. After that, it can take a few days for the steroid to work and the swelling to go down  Avoid activity that puts stress on the treatment area for at least 24 hours. Place an ice pack wrapped in a towel on the treatment area. Take over-the-counter pain relievers or nonsteroidal anti-inflammatory drugs (NSAIDs) to ease pain and swelling. Avoid submersion -- like in a bath or pool -- for two days after the injection. After that, however, you can take a shower. How long do bursa injections provide symptom relief? Symptom relief varies depending on the affected joint. Bursa injections can reduce pain and inflammation for several months or up to one year.  Some people get permanent symptom relief with one treatment. If you continue activities that irritate the joint, bursitis symptoms may return faster.  How often can I get bursa injections? You typically need to wait at least three months between injections according to the treatment condition. This is because too many injections can damage the bursa, joint and connective tissue.  Additionally, I sent a referral to OB/GYN.  They will call you to schedule in the next 2 weeks.  If you do not hear from them in that timeframe, please let me know.  If you have any questions or concerns, please feel free to call the clinic.   Have a wonderful day,  Dr. Darral Dash [facility] [phone]

## 2023-04-08 NOTE — Progress Notes (Signed)
    SUBJECTIVE:   CHIEF COMPLAINT / HPI:   [name] is a pleasant 42 year old female here to discuss hip pain and dysfunctional uterine bleeding.  Dysfunctional uterine bleeding LMP: lasted 2 weeks for the past 2 cycles Reports needing to use pads and tampons simultaneously Taking naproxen every single day without much relief Was on combined OCP (last weekend) and not taking it anymore which she discontinued this past weekend Interested in exploring other birth control options, would desire 1 that has fewer side effects and could reduce her bleeding  Hip pain Worse throughout the day especially with walking Awakens her at night she is a side sleeper. No inciting injury or fall She noticed that in both hips on the lateral aspect Feels like a dull ache, quite bothersome to her She works a job in which she is on her feet for about 10 hours/day 5 days/week  PERTINENT  PMH / PSH: Reviewed  OBJECTIVE:   BP (!) 152/91   Pulse 73   Ht 5\' 4"  (1.626 m)   Wt 190 lb 6.4 oz (86.4 kg)   LMP 03/30/2023   SpO2 99%   BMI 32.68 kg/m   General: Pleasant well-appearing, no distress Cardiac: Regular rate and rhythm Respiratory: Breathing on room air MSK, hips: Inspection yields no erythema, bruising, or bony abnormalities.  She is quite tender to palpation of the lateral aspect of the hip on both sides near the greater trochanter.  Negative FABER test. Negative logroll.  ASSESSMENT/PLAN:   PROCEDURE: INJECTION: Patient was given informed consent, signed copy in the chart. Appropriate time out was taken. Area prepped and draped in usual sterile fashion. Ethyl chloride was  used for local anesthesia. A 21 gauge 1 1/2 inch needle was used.. 1 cc of methylprednisolone 40 mg/ml plus  4 cc of 1% lidocaine without epinephrine was injected into the left greater trochanteric bursa hip using a(n) perpindicular/point of maximal tenderness approach.   The patient tolerated the procedure well. There  were no complications. Post procedure instructions were given.   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 Tolerated left greater trochanteric bursa injection well today. Obtained informed consent, and she did not have any contraindications including overlying cellulitis, hypersensitivity or allergy to corticosteroid/lidocaine.  She is not a diabetic. Aftercare instructions provided. She should return to care if pain persists or becomes more severe, fever, signs of infection develop.     Darral Dash, DO [facility]

## 2023-04-08 NOTE — Assessment & Plan Note (Signed)
Still having dysfunctional uterine bleeding and now worsened by withdrawal bleeding Not having much relief from Naproxen Discussed IUD as a great option for her as this is highly effective against

## 2023-04-09 ENCOUNTER — Encounter: Payer: Self-pay | Admitting: Gastroenterology

## 2023-04-14 ENCOUNTER — Ambulatory Visit: Payer: Self-pay | Admitting: Student

## 2023-04-16 ENCOUNTER — Other Ambulatory Visit: Payer: Self-pay

## 2023-04-16 ENCOUNTER — Ambulatory Visit: Payer: Medicaid Other | Admitting: Student

## 2023-04-16 ENCOUNTER — Encounter: Payer: Self-pay | Admitting: Student

## 2023-04-16 VITALS — BP 136/86 | HR 81 | Ht 63.0 in | Wt 191.2 lb

## 2023-04-16 DIAGNOSIS — M719 Bursopathy, unspecified: Secondary | ICD-10-CM

## 2023-04-16 MED ORDER — METHYLPREDNISOLONE ACETATE 80 MG/ML IJ SUSP
80.0000 mg | Freq: Once | INTRAMUSCULAR | Status: AC
Start: 2023-04-16 — End: 2023-04-16
  Administered 2023-04-16: 80 mg via INTRAMUSCULAR

## 2023-04-16 NOTE — Progress Notes (Unsigned)
    SUBJECTIVE:   CHIEF COMPLAINT / HPI:   F/U Left Hip Injection:  Hip injection performed by Dr. Melissa Noon on 04/08/23 Reports today that she wants another injection in the right hip  She was instructed to return for this  Notes pain is similar to previous visit   Located in lateral hip  Was worse on the left but seems that the injection was helpful  Ongoing for quite some time   PERTINENT  PMH / PSH: ***  OBJECTIVE:   LMP 03/30/2023   General: Alert and oriented in no apparent distress Heart: Regular rate and rhythm with no murmurs appreciated Lungs: CTA bilaterally, no wheezing Abdomen: Bowel sounds present, no abdominal pain Skin: Warm and dry Extremities: No lower extremity edema   ASSESSMENT/PLAN:   No problem-specific Assessment & Plan notes found for this encounter.     Alfredo Martinez, MD [facility]

## 2023-04-16 NOTE — Patient Instructions (Addendum)
It was great to see you today! Thank you for choosing [facility] for your primary care.  Today we addressed: We gave a hip injection today   What is recovery like after a bursa injection? If the injection had an anesthetic, you should get some pain relief that lasts for a couple of hours. After that, it can take a few days for the steroid to work and the swelling to go down   Avoid activity that puts stress on the treatment area for at least 24 hours. Place an ice pack wrapped in a towel on the treatment area. Take over-the-counter pain relievers or nonsteroidal anti-inflammatory drugs (NSAIDs) to ease pain and swelling. Avoid submersion -- like in a bath or pool -- for two days after the injection. After that, however, you can take a shower. How long do bursa injections provide symptom relief? Symptom relief varies depending on the affected joint. Bursa injections can reduce pain and inflammation for several months or up to one year.   Some people get permanent symptom relief with one treatment. If you continue activities that irritate the joint, bursitis symptoms may return faster.   How often can I get bursa injections? You typically need to wait at least three months between injections according to the treatment condition. This is because too many injections can damage the bursa, joint and connective tissue.    If you haven't already, sign up for My Chart to have easy access to your labs results, and communication with your primary care physician. I recommend that you always bring your medications to each appointment as this makes it easy to ensure you are on the correct medications and helps [place] not miss refills when you need them. Call the clinic at [phone] if your symptoms worsen or you have any concerns. Return in about 4 weeks (around 05/14/2023). Please arrive 15 minutes before your appointment to ensure smooth check in process.  We appreciate your efforts in making  this happen.  Thank you for allowing me to participate in your care, Alfredo Martinez, MD 04/16/2023, 4:43 PM PGY-3, [facility]

## 2023-05-11 ENCOUNTER — Ambulatory Visit (INDEPENDENT_AMBULATORY_CARE_PROVIDER_SITE_OTHER): Payer: Medicaid Other | Admitting: Student

## 2023-05-11 VITALS — BP 121/60 | HR 68 | Temp 98.3°F | Ht 64.0 in | Wt 191.4 lb

## 2023-05-11 DIAGNOSIS — M25559 Pain in unspecified hip: Secondary | ICD-10-CM

## 2023-05-11 NOTE — Progress Notes (Signed)
    SUBJECTIVE:   CHIEF COMPLAINT / HPI:   [name] is a 42 year-old female here to discuss ongoing hip pain, L pain worse than R. Sleeps on her side. Works 5 days a week 10 hour shifts. Has had no fall or injury Wants to know when she can get injections again  Felt that the greater trochanter corticosteroid injection helped a lot for her, lasted about 4-6 weeks of relief. Open to trying physical therapy.  PERTINENT  PMH / PSH: Greater trochanteric pain syndrome, hypertension  OBJECTIVE:   BP 121/60   Pulse 68   Temp 98.3 F (36.8 C)   Ht 5\' 4"  (1.626 m)   Wt 191 lb 6.4 oz (86.8 kg)   SpO2 99%   BMI 32.85 kg/m   General: Pleasant, Well-appearing MSK, hip: Normal ROM in bilateral hips, normal gait.  S/p tenderness to palpation over greater trochanter left worse than right.  ASSESSMENT/PLAN:   Greater trochanteric pain syndrome Cannot get another corticosteroid injection until December 2024. Referral to physical therapy today Provided with home exercises for bursitis Discussed that she may use naproxen as needed for pain, but she should not use this daily given GI bleed risk and hypertension     Darral Dash, DO [facility]

## 2023-05-11 NOTE — Patient Instructions (Addendum)
It was great seeing you today.  As we discussed, - I sent a referral to the physical therapists -You may continue taking Naproxen as needed for pain, do not take this every day. Make sure to take it with food. - Come back and see me in 6 weeks  If you have any questions or concerns, please feel free to call the clinic.   Have a wonderful day,  Dr. Darral Dash [facility] [phone]

## 2023-05-13 NOTE — Assessment & Plan Note (Signed)
Cannot get another corticosteroid injection until December 2024. Referral to physical therapy today Provided with home exercises for bursitis Discussed that she may use naproxen as needed for pain, but she should not use this daily given GI bleed risk and hypertension

## 2023-05-18 ENCOUNTER — Other Ambulatory Visit: Payer: Self-pay | Admitting: Student

## 2023-05-18 DIAGNOSIS — N946 Dysmenorrhea, unspecified: Secondary | ICD-10-CM

## 2023-06-08 ENCOUNTER — Encounter: Payer: Medicaid Other | Admitting: Obstetrics and Gynecology

## 2023-06-15 ENCOUNTER — Ambulatory Visit (INDEPENDENT_AMBULATORY_CARE_PROVIDER_SITE_OTHER): Payer: Medicaid Other | Admitting: Student

## 2023-06-15 ENCOUNTER — Other Ambulatory Visit (HOSPITAL_COMMUNITY)
Admission: RE | Admit: 2023-06-15 | Discharge: 2023-06-15 | Disposition: A | Discharge status: Home / Self Care | Payer: Medicaid Other | Source: Ambulatory Visit | Attending: Family Medicine | Admitting: Family Medicine

## 2023-06-15 VITALS — BP 138/79 | Wt 194.8 lb

## 2023-06-15 DIAGNOSIS — Z113 Encounter for screening for infections with a predominantly sexual mode of transmission: Secondary | ICD-10-CM | POA: Diagnosis present

## 2023-06-15 DIAGNOSIS — B9689 Other specified bacterial agents as the cause of diseases classified elsewhere: Secondary | ICD-10-CM | POA: Diagnosis not present

## 2023-06-15 DIAGNOSIS — N760 Acute vaginitis: Secondary | ICD-10-CM

## 2023-06-15 NOTE — Progress Notes (Signed)
    SUBJECTIVE:   CHIEF COMPLAINT / HPI:   [name] is a 42 year old female here with complaint of continued hip bursitis and desire for repeat corticosteroid injection, as well as vaginal irritation and discharge. We decided to address the genitourinary complaints today, and schedule follow-up visit for injections.  Vaginal irritation She says she has been having a lot of irritation around her labia, and vagina.  Denies any changes in her discharge. She is sexually active and does not use condoms or barrier methods. When bathing, she uses Dove soap and warm water.  She feels frustrated that she continues to have this vaginal irritation.  PERTINENT  PMH / PSH: Hypertension External hemorrhoids Iron deficiency anemia Bilateral hip bursitis  OBJECTIVE:   BP 138/79   Wt 194 lb 12.8 oz (88.4 kg)   SpO2 98%   BMI 33.44 kg/m   General: Pleasant, well-appearing, well-nourished Respiratory: Normal work of breathing on room air.  Speaks in full sentences GU: Daycia, CMA present as chaperone.  Genital wart on left labia majora.  Otherwise, no rashes or lesions on external examination.  Significant amount of thin, watery pinkish discharge pooling in the posterior fornix.  No cervical motion tenderness.   ASSESSMENT/PLAN:   Bacterial vaginosis Cervicovaginal swab negative for STI: gonorrhea, chlamydia, trichomonas negative. + for bacterial vaginosis. Plan to treat with metronidazole 500 mg twice daily for 7 days. Encourage patient to use condoms or other barrier methods    Follow-up visit scheduled to return for corticosteroid injections  Darral Dash, DO [facility]

## 2023-06-15 NOTE — Patient Instructions (Signed)
It was great seeing you today.  We completed testing today.  I will call you if anything is abnormal or needs treatment.   If you have any questions or concerns, please feel free to call the clinic.   Have a wonderful day,  Dr. Darral Dash [facility] [phone]

## 2023-06-17 ENCOUNTER — Encounter: Payer: Self-pay | Admitting: Student

## 2023-06-17 LAB — CERVICOVAGINAL ANCILLARY ONLY
Bacterial Vaginitis (gardnerella): POSITIVE — AB
Candida Glabrata: NEGATIVE
Candida Vaginitis: NEGATIVE
Chlamydia: NEGATIVE
Comment: NEGATIVE
Comment: NEGATIVE
Comment: NEGATIVE
Comment: NEGATIVE
Comment: NEGATIVE
Comment: NORMAL
Neisseria Gonorrhea: NEGATIVE
Trichomonas: NEGATIVE

## 2023-06-18 ENCOUNTER — Other Ambulatory Visit: Payer: Self-pay | Admitting: Student

## 2023-06-18 ENCOUNTER — Encounter: Payer: Self-pay | Admitting: Family Medicine

## 2023-06-18 ENCOUNTER — Telehealth: Payer: Self-pay

## 2023-06-18 ENCOUNTER — Telehealth: Payer: Self-pay | Admitting: Family Medicine

## 2023-06-18 DIAGNOSIS — B9689 Other specified bacterial agents as the cause of diseases classified elsewhere: Secondary | ICD-10-CM

## 2023-06-18 DIAGNOSIS — N760 Acute vaginitis: Secondary | ICD-10-CM | POA: Insufficient documentation

## 2023-06-18 HISTORY — DX: Other specified bacterial agents as the cause of diseases classified elsewhere: B96.89

## 2023-06-18 MED ORDER — METRONIDAZOLE 500 MG PO TABS: 500.0000 mg | ORAL_TABLET | Freq: Two times a day (BID) | ORAL | 0 refills | Status: DC

## 2023-06-18 NOTE — Telephone Encounter (Signed)
HIPAA compliant callback message left and I also sent her a MyChart message regarding hip injection appointment.  PCP inadvertently placed her on my schedule for Hip injection which I don't do. I have reached out to PCP to try to contact this patient again to reschedule her appointment.  PCP agreed to follow up with this.

## 2023-06-18 NOTE — Telephone Encounter (Signed)
Called patient and left a generic VM to contact the office to make an appointment with Dr. Melissa Noon for her Bursitis injection   If patient calls back please schedule an appointment for her injection per Dr. Tomi Bamberger request.    Thank you,  Drusilla Kanner, CMA

## 2023-06-18 NOTE — Assessment & Plan Note (Addendum)
Cervicovaginal swab negative for STI: gonorrhea, chlamydia, trichomonas negative. + for bacterial vaginosis. Plan to treat with metronidazole 500 mg twice daily for 7 days. Encourage patient to use condoms or other barrier methods

## 2023-06-18 NOTE — Telephone Encounter (Signed)
-----   Message from Darral Dash sent at 06/18/2023  9:48 AM EST ----- Regarding: Reschedule Hi green team,  Can you please call to reschedule this patient for her bursitis injection? I am happy to do it if you can find an open slot in my schedule. Dr. Lum Babe does not do these injections, which I was unaware of.  Nolberto Hanlon

## 2023-06-19 ENCOUNTER — Ambulatory Visit: Payer: Medicaid Other | Admitting: Family Medicine

## 2023-06-19 ENCOUNTER — Ambulatory Visit: Payer: Medicaid Other

## 2023-06-22 ENCOUNTER — Encounter: Payer: Self-pay | Admitting: Student

## 2023-06-22 ENCOUNTER — Ambulatory Visit (INDEPENDENT_AMBULATORY_CARE_PROVIDER_SITE_OTHER): Payer: Medicaid Other | Admitting: Student

## 2023-06-22 VITALS — BP 128/80 | HR 84 | Temp 98.3°F | Ht 64.0 in | Wt 194.0 lb

## 2023-06-22 DIAGNOSIS — M719 Bursopathy, unspecified: Secondary | ICD-10-CM

## 2023-06-22 MED ORDER — MELOXICAM 15 MG PO TABS: 15.0000 mg | ORAL_TABLET | Freq: Every day | ORAL | 0 refills | Status: AC

## 2023-06-22 NOTE — Patient Instructions (Signed)
Pleasure to meet you today.  Unfortunately we are unable to do your hip injections today because you are below the 22-month mark for your neck steroid injection.  However I have sent in prescription for meloxicam, which is an anti-inflammatory medication you take it once daily for 14 days until you get your steroid injection.  You can continue to do over-the-counter Voltaren gel over the hip area to help with the pain.  While taking meloxicam you should avoid taking the naproxen.  You can do Tylenol instead.

## 2023-06-22 NOTE — Progress Notes (Signed)
    SUBJECTIVE:   CHIEF COMPLAINT / HPI:    42 year old female with history of chronic hip pain Presenting today for bilateral hip injection And reports she has had lateral hip pain Previously had steroid injection on 9/25 on the left, and 10/8 on the right hip. Injection provided significant relief Patient will like to get second injection today.   PERTINENT  PMH / PSH: Reviewed  OBJECTIVE:   BP 128/80   Pulse 84   Temp 98.3 F (36.8 C)   Ht 5\' 4"  (1.626 m)   Wt 194 lb (88 kg)   SpO2 99%   BMI 33.30 kg/m    Physical Exam General: Alert, well appearing, NAD, Oriented x4 Cardiovascular: RRR, No Murmurs, Normal S2/S2 Respiratory: CTAB, No wheezing or Rales Extremities: No edema on extremities   Hip: Tenderness with TPP on the bilateral hip over the greater trochanter area.  No swelling or deformity noted.  ASSESSMENT/PLAN:   Bilateral hip pain Suspected to be due to bursitis.  Has responded well to steroid injection in the past.  Fortunately too elevated for patient to receive her second steroid injection today.  Informed patient steroid injections are to be given minimum 3 months apart and she is currently not due for injections today.   - Informed patient I will send in prescription for meloxicam which she will take once daily for 14 days until steroid injections are given.   - Scheduled follow-up appointment with patient on 12/27 for bilateral hip injections with me.   - Advised patient to avoid taking naproxen while taking meloxicam due to risk of GI bleed.  Patient verbalized understanding and agreeable to plan.  Jerre Simon, MD [facility]

## 2023-06-26 ENCOUNTER — Ambulatory Visit: Payer: Medicaid Other | Admitting: Physical Therapy

## 2023-06-30 ENCOUNTER — Telehealth: Payer: Self-pay

## 2023-06-30 NOTE — Telephone Encounter (Signed)
Left message for patient to return my call.

## 2023-06-30 NOTE — Telephone Encounter (Signed)
-----   Message from [facility] Marchelle Folks M sent at 03/31/2023  9:14 AM EDT ----- Patient needs 3 month f/u CBC, [place] patient

## 2023-07-01 NOTE — Telephone Encounter (Signed)
Left message for patient to return my call.

## 2023-07-02 NOTE — Telephone Encounter (Signed)
Left message for patient to return my call. Will send MyChart message to patient.

## 2023-07-03 ENCOUNTER — Ambulatory Visit: Payer: Medicaid Other | Attending: Family Medicine | Admitting: Physical Therapy

## 2023-07-03 NOTE — Therapy (Deleted)
OUTPATIENT PHYSICAL THERAPY LOWER EXTREMITY EVALUATION   Patient Name: FORREST TELLADO MRN: [id number] DOB:Sep 22, 1980, 42 y.o., female Today's Date: 07/03/2023  END OF SESSION:   Past Medical History:  Diagnosis Date   Allergy    Anxiety    Arthralgia of left upper arm 08/07/2019   Cough 03/21/2016   no fever, states coughing up "snot colored cold stuff"-  has had this x 1.5 weeks and is getting much better- taking tylenol cold   Depression    never been treated , "probably got some "   Encounter for medical examination to establish care 12/11/2016   Gall stones    GERD without esophagitis 05/16/2014   Headache(784.0)    Hemorrhoids    S/P cesarean section 11/03/2014   Screening examination for STD (sexually transmitted disease) 12/11/2016   Sebaceous cyst 05/29/2022   Skin mole 12/11/2016   Skin nodule 05/08/2022   Stress 02/08/2023   Tooth ache    right upper molar partially broke off   Trichomonas vaginitis 02/08/2023   Wrist pain 01/02/2021   Past Surgical History:  Procedure Laterality Date   CESAREAN SECTION     CESAREAN SECTION WITH BILATERAL TUBAL LIGATION Bilateral 11/03/2014   Procedure: CESAREAN SECTION WITH BILATERAL TUBAL LIGATION;  Surgeon: Brock Bad, MD;  Location: [facility];  Service: Obstetrics;  Laterality: Bilateral;   CHOLECYSTECTOMY     CHOLECYSTECTOMY, LAPAROSCOPIC     DILATION AND CURETTAGE OF UTERUS     INCISIONAL HERNIA REPAIR N/A 03/27/2016   Procedure: LAPAROSCOPIC ASSISTED REPAIR OF INCARCERATED  INCISIONAL HERNIA;  Surgeon: Gaynelle Adu, MD;  Location: [facility];  Service: General;  Laterality: N/A;   INSERTION OF MESH N/A 03/27/2016   Procedure: INSERTION OF MESH;  Surgeon: Gaynelle Adu, MD;  Location: [facility];  Service: General;  Laterality: N/A;   THERAPEUTIC ABORTION     TUBAL LIGATION     Patient Active Problem List   Diagnosis Date Noted   Bacterial vaginosis 06/18/2023   Greater trochanteric pain syndrome 04/08/2023   Screening  examination for STI 03/03/2023   Hypertension 02/08/2023   Iron deficiency anemia 02/05/2023   Dysmenorrhea 05/08/2022   Vaginal discharge 01/02/2021   External hemorrhoid 01/02/2021   Vitamin D deficiency 11/13/2016   Knee pain, bilateral 09/24/2016    PCP: Darral Dash, DO  REFERRING PROVIDER: Carney Living, MD  REFERRING DIAG: M25.559 (ICD-10-CM) - Greater trochanteric pain syndrome  THERAPY DIAG:  No diagnosis found.  Rationale for Evaluation and Treatment: Rehabilitation  ONSET DATE: ***  SUBJECTIVE:   SUBJECTIVE STATEMENT: ***  PERTINENT HISTORY: *** PAIN:  Are you having pain? {OPRCPAIN:27236}  PRECAUTIONS: {Therapy precautions:24002}  RED FLAGS: {PT Red Flags:29287}   WEIGHT BEARING RESTRICTIONS: {Yes ***/No:24003}  FALLS:  Has patient fallen in last 6 months? {fallsyesno:27318}  LIVING ENVIRONMENT: Lives with: {OPRC lives with:25569::"lives with their family"} Lives in: {Lives in:25570} Stairs: {opstairs:27293} Has following equipment at home: {Assistive devices:23999}  OCCUPATION: ***  PLOF: {PLOF:24004}  PATIENT GOALS: ***  NEXT MD VISIT: ***  OBJECTIVE:  Note: Objective measures were completed at Evaluation unless otherwise noted.  DIAGNOSTIC FINDINGS: ***  PATIENT SURVEYS:  {rehab surveys:24030}  COGNITION: Overall cognitive status: {cognition:24006}     SENSATION: {sensation:27233}  EDEMA:  {edema:24020}  MUSCLE LENGTH: Hamstrings: Right *** deg; Left *** deg Maisie Fus test: Right *** deg; Left *** deg  POSTURE: {posture:25561}  PALPATION: ***  LOWER EXTREMITY ROM:  {AROM/PROM:27142} ROM Right eval Left eval  Hip flexion    Hip extension  Hip abduction    Hip adduction    Hip internal rotation    Hip external rotation    Knee flexion    Knee extension    Ankle dorsiflexion    Ankle plantarflexion    Ankle inversion    Ankle eversion     (Blank rows = not tested)  LOWER EXTREMITY MMT:  MMT  Right eval Left eval  Hip flexion    Hip extension    Hip abduction    Hip adduction    Hip internal rotation    Hip external rotation    Knee flexion    Knee extension    Ankle dorsiflexion    Ankle plantarflexion    Ankle inversion    Ankle eversion     (Blank rows = not tested)  LOWER EXTREMITY SPECIAL TESTS:  {LEspecialtests:26242}  FUNCTIONAL TESTS:  {Functional tests:24029}  GAIT: Distance walked: *** Assistive device utilized: {Assistive devices:23999} Level of assistance: {Levels of assistance:24026} Comments: ***                                                                                                                                TREATMENT DATE: ***    PATIENT EDUCATION:  Education details: *** Person educated: {Person educated:25204} Education method: {Education Method:25205} Education comprehension: {Education Comprehension:25206}  HOME EXERCISE PROGRAM: ***  ASSESSMENT:  CLINICAL IMPRESSION: Patient is a *** y.o. *** who was seen today for physical therapy evaluation and treatment for ***.   OBJECTIVE IMPAIRMENTS: {opptimpairments:25111}.   ACTIVITY LIMITATIONS: {activitylimitations:27494}  PARTICIPATION LIMITATIONS: {participationrestrictions:25113}  PERSONAL FACTORS: {Personal factors:25162} are also affecting patient's functional outcome.   REHAB POTENTIAL: {rehabpotential:25112}  CLINICAL DECISION MAKING: {clinical decision making:25114}  EVALUATION COMPLEXITY: {Evaluation complexity:25115}   GOALS: Goals reviewed with patient? {yes/no:20286}  SHORT TERM GOALS: Target date: *** *** Baseline: Goal status: INITIAL  2.  *** Baseline:  Goal status: INITIAL  3.  *** Baseline:  Goal status: INITIAL  4.  *** Baseline:  Goal status: INITIAL  5.  *** Baseline:  Goal status: INITIAL  6.  *** Baseline:  Goal status: INITIAL  LONG TERM GOALS: Target date: ***  *** Baseline:  Goal status: INITIAL  2.   *** Baseline:  Goal status: INITIAL  3.  *** Baseline:  Goal status: INITIAL  4.  *** Baseline:  Goal status: INITIAL  5.  *** Baseline:  Goal status: INITIAL  6.  *** Baseline:  Goal status: INITIAL   PLAN:  PT FREQUENCY: {rehab frequency:25116}  PT DURATION: {rehab duration:25117}  PLANNED INTERVENTIONS: {rehab planned interventions:25118::"97110-Therapeutic exercises","97530- Therapeutic [phone]- Neuromuscular re-education","97535- Self AOZH","08657- Manual therapy"}  PLAN FOR NEXT SESSION: ***   Emara Lichter April Ma L Noga Fogg, PT 07/03/2023, 11:26 AM

## 2023-07-10 ENCOUNTER — Ambulatory Visit: Payer: Medicaid Other | Admitting: Student

## 2023-07-10 ENCOUNTER — Ambulatory Visit (INDEPENDENT_AMBULATORY_CARE_PROVIDER_SITE_OTHER): Payer: Medicaid Other | Admitting: Student

## 2023-07-10 VITALS — BP 110/59 | HR 92 | Ht 64.0 in | Wt 190.4 lb

## 2023-07-10 DIAGNOSIS — M25559 Pain in unspecified hip: Secondary | ICD-10-CM

## 2023-07-10 MED ORDER — METHYLPREDNISOLONE ACETATE 40 MG/ML IJ SUSP
40.0000 mg | Freq: Once | INTRAMUSCULAR | Status: AC
  Administered 2023-07-10: 40 mg via INTRAMUSCULAR

## 2023-07-10 NOTE — Assessment & Plan Note (Addendum)
  PROCEDURE: INJECTION: Patient was given informed consent, signed copy in the chart. Appropriate time out was taken. Area prepped and draped in usual sterile fashion.  A 25 gauge 1 1/2 inch needle was used.. 1 cc of methylprednisolone 40 mg/ml plus  4 cc of 1% lidocaine without epinephrine was injected into the left greater trochanteric bursa hip using a(n) perpindicular/point of maximal tenderness approach.   Patient was given informed consent, signed copy in the chart. Appropriate time out was taken. Area prepped and draped in usual sterile fashion.  A 25 gauge 1 1/2 inch needle was used.. 1 cc of methylprednisolone 40 mg/ml plus  4 cc of 1% lidocaine without epinephrine was injected into the right greater trochanteric bursa hip using a(n) perpindicular/point of maximal tenderness approach.   Greater trochanteric pain syndrome Tolerated bilateral hip injections today without difficulty. Obtained informed consent, and she did not have any contraindications including overlying cellulitis, hypersensitivity or allergy to corticosteroid/lidocaine.   Aftercare instructions provided.

## 2023-07-10 NOTE — Patient Instructions (Signed)
It was great seeing you today.   We gave you a steroid injection for hip bursitis. You should also do hip exercises that I gave you at least 3x per week. Often times, weakness in the hip abductor muscles causes the pain you are experiencing.   What is recovery like after a bursa injection? If the injection had an anesthetic, you should get some pain relief that lasts for a couple of hours. After that, it can take a few days for the steroid to work and the swelling to go down   Avoid activity that puts stress on the treatment area for at least 24 hours. Place an ice pack wrapped in a towel on the treatment area. Take over-the-counter pain relievers or nonsteroidal anti-inflammatory drugs (NSAIDs) to ease pain and swelling. Avoid submersion -- like in a bath or pool -- for two days after the injection. After that, however, you can take a shower. How long do bursa injections provide symptom relief? Symptom relief varies depending on the affected joint. Bursa injections can reduce pain and inflammation for several months or up to one year.   Some people get permanent symptom relief with one treatment. If you continue activities that irritate the joint, bursitis symptoms may return faster.   How often can I get bursa injections? You typically need to wait at least three months between injections according to the treatment condition. This is because too many injections can damage the bursa, joint and connective tissue.

## 2023-07-10 NOTE — Progress Notes (Unsigned)
Attending Note: I have seen and examined this patient. I have discussed this patient with the resident and reviewed the assessment and plan as documented above. I agree with the resident's findings and plan. I was present for the entire procedure and supervised the resident. Denny Levy MD, MA [phone] pager

## 2023-07-10 NOTE — Progress Notes (Signed)
    SUBJECTIVE:   CHIEF COMPLAINT / HPI:   [name] is a 42 year-old female here for bilateral hip bursitis (greater trochanteric pain syndrome) injections.  Last injections were 3 months ago, 04/08/2023.  Which she reported good relief from.  She is not currently doing any home hip exercises or physical therapy.  PERTINENT  PMH / PSH:  Greater trochanteric pain syndrome Bilateral knee pain  OBJECTIVE:   BP (!) 110/59   Pulse 92   Ht 5' 4 (1.626 m)   Wt 190 lb 6.4 oz (86.4 kg)   SpO2 100%   BMI 32.68 kg/m   General: NAD, well appearing Cardiac: RRR Neuro: A&O Respiratory: normal WOB on RA. No wheezing or crackles on auscultation, good lung sounds throughout MSK: Bilateral hips: Inspection yields no erythema, bruising or bony abnormalities. Extremities: Moving all 4 extremities equally    ASSESSMENT/PLAN:   Greater trochanteric pain syndrome   PROCEDURE: INJECTION: Patient was given informed consent, signed copy in the chart. Appropriate time out was taken. Area prepped and draped in usual sterile fashion.  A 25 gauge 1 1/2 inch needle was used.. 1 cc of methylprednisolone  40 mg/ml plus  4 cc of 1% lidocaine  without epinephrine  was injected into the left greater trochanteric bursa hip using a(n) perpindicular/point of maximal tenderness approach.   Patient was given informed consent, signed copy in the chart. Appropriate time out was taken. Area prepped and draped in usual sterile fashion.  A 25 gauge 1 1/2 inch needle was used.. 1 cc of methylprednisolone  40 mg/ml plus  4 cc of 1% lidocaine  without epinephrine  was injected into the right greater trochanteric bursa hip using a(n) perpindicular/point of maximal tenderness approach.   Greater trochanteric pain syndrome Tolerated bilateral hip injections today without difficulty. Obtained informed consent, and she did not have any contraindications including overlying cellulitis, hypersensitivity or allergy to  corticosteroid/lidocaine .   Aftercare instructions provided.   Provided with home hip abductor exercises, encouraged to perform them at least 3 times a week. Discussed trying to avoid excessive corticosteroid injections, and that home exercise regimen should be at the forefront of her treatment plan for her bursitis.  Domonick Sittner, DO [facility]

## 2023-07-17 ENCOUNTER — Encounter: Payer: Medicaid Other | Admitting: Obstetrics and Gynecology

## 2023-07-21 ENCOUNTER — Other Ambulatory Visit: Payer: Medicaid Other

## 2023-07-21 ENCOUNTER — Encounter: Payer: Self-pay | Admitting: Gastroenterology

## 2023-07-21 ENCOUNTER — Ambulatory Visit (INDEPENDENT_AMBULATORY_CARE_PROVIDER_SITE_OTHER): Payer: Medicaid Other | Admitting: Gastroenterology

## 2023-07-21 VITALS — BP 124/80 | HR 84 | Ht 64.0 in | Wt 194.0 lb

## 2023-07-21 DIAGNOSIS — D500 Iron deficiency anemia secondary to blood loss (chronic): Secondary | ICD-10-CM

## 2023-07-21 DIAGNOSIS — K642 Third degree hemorrhoids: Secondary | ICD-10-CM

## 2023-07-21 LAB — CBC WITH DIFFERENTIAL/PLATELET
Basophils Absolute: 0.1 10*3/uL (ref 0.0–0.1)
Basophils Relative: 0.8 % (ref 0.0–3.0)
Eosinophils Absolute: 0.1 10*3/uL (ref 0.0–0.7)
Eosinophils Relative: 1.8 % (ref 0.0–5.0)
HCT: 30.3 % — ABNORMAL LOW (ref 36.0–46.0)
Hemoglobin: 9.8 g/dL — ABNORMAL LOW (ref 12.0–15.0)
Lymphocytes Relative: 35.6 % (ref 12.0–46.0)
Lymphs Abs: 2.3 10*3/uL (ref 0.7–4.0)
MCHC: 32.4 g/dL (ref 30.0–36.0)
MCV: 93 fL (ref 78.0–100.0)
Monocytes Absolute: 0.5 10*3/uL (ref 0.1–1.0)
Monocytes Relative: 8 % (ref 3.0–12.0)
Neutro Abs: 3.4 10*3/uL (ref 1.4–7.7)
Neutrophils Relative %: 53.8 % (ref 43.0–77.0)
Platelets: 214 10*3/uL (ref 150.0–400.0)
RBC: 3.26 Mil/uL — ABNORMAL LOW (ref 3.87–5.11)
RDW: 14.5 % (ref 11.5–15.5)
WBC: 6.4 10*3/uL (ref 4.0–10.5)

## 2023-07-21 LAB — IBC + FERRITIN
Ferritin: 3.5 ng/mL — ABNORMAL LOW (ref 10.0–291.0)
Iron: 25 ug/dL — ABNORMAL LOW (ref 42–145)
Saturation Ratios: 5.8 % — ABNORMAL LOW (ref 20.0–50.0)
TIBC: 434 ug/dL (ref 250.0–450.0)
Transferrin: 310 mg/dL (ref 212.0–360.0)

## 2023-07-21 MED ORDER — AMBULATORY NON FORMULARY MEDICATION: 0 refills | Status: DC

## 2023-07-21 NOTE — Patient Instructions (Addendum)
 HEMORRHOID BANDING PROCEDURE    FOLLOW-UP CARE   The procedure you have had should have been relatively painless since the banding of the area involved does not have nerve endings and there is no pain sensation.  The rubber band cuts off the blood supply to the hemorrhoid and the band may fall off as soon as 48 hours after the banding (the band may occasionally be seen in the toilet bowl following a bowel movement). You may notice a temporary feeling of fullness in the rectum which should respond adequately to plain Tylenol  or Motrin .  Following the banding, avoid strenuous exercise that evening and resume full activity the next day.  A sitz bath (soaking in a warm tub) or bidet is soothing, and can be useful for cleansing the area after bowel movements.     To avoid constipation, take two tablespoons of natural wheat bran, natural oat bran, flax, Benefiber or any over the counter fiber supplement and increase your water intake to 7-8 glasses daily.    Unless you have been prescribed anorectal medication, do not put anything inside your rectum for two weeks: No suppositories, enemas, fingers, etc.  Occasionally, you may have more bleeding than usual after the banding procedure.  This is often from the untreated hemorrhoids rather than the treated one.  Don't be concerned if there is a tablespoon or so of blood.  If there is more blood than this, lie flat with your bottom higher than your head and apply an ice pack to the area. If the bleeding does not stop within a half an hour or if you feel faint, call our office at [phone] or go to the emergency room.  Problems are not common; however, if there is a substantial amount of bleeding, severe pain, chills, fever or difficulty passing urine (very rare) or other problems, you should call us  at [phone] or report to the nearest emergency room.  Do not stay seated continuously for more than 2-3 hours for a day or two after the procedure.   Tighten your buttock muscles 10-15 times every two hours and take 10-15 deep breaths every 1-2 hours.  Do not spend more than a few minutes on the toilet if you cannot empty your bowel; instead re-visit the toilet at a later time.  __________________________________________________________  We have sent a prescription for nitroglycerin 0.125% gel to [facility]. You should apply a pea size amount to your rectum three times daily for 2 weeks as needed for discomfort.  [facility]'s information is below: Address: [address] [place], [place] [address]  [phone]  *Please DO NOT go directly from our office to pick up this medication! Give the pharmacy 1 day to process the prescription as this is compounded and takes time to make.   _______________________________________________________ [name] have been scheduled for a hemorrhoid banding appointment on Thursday, 09-17-23 at 11:00 am. Please arrive 10 minutes early for registration. If you need to reschedule or cancel this appointment please call [phone] as soon as possible. Thank you.  Please go to the lab in the basement of our building to have lab work done as you leave today. Hit B for basement when you get on the elevator.  When the doors open the lab is on your left.  We will call you with the results. Thank you.    Thank you for entrusting me with your care and for choosing [facility], Dr. Elspeth Naval

## 2023-07-21 NOTE — Progress Notes (Signed)
 43 year old female here for follow-up visit for hemorrhoid banding.  Recall she was seen in July for grade 3 hemorrhoids, noted to have frequent rectal bleeding from this.  Has had some constipation in the past, told to take MiraLAX .  She was on oral iron supplementation in July and her hemoglobin came back at 9 with severe iron deficiency.  We gave her IV iron and she responded quite well to it.  Lab in September showed hemoglobin 12.1.  Since her last visit she had an EGD and colonoscopy with me in September for iron deficiency.  She had internal hemorrhoids that were large and thought to be the cause of her rectal bleeding and possibly iron deficiency.  She also has heavy menses.  EGD showed no clear cause for her iron deficiency.  H. pylori biopsies negative.  We had discussed performing hemorrhoid banding as well as following up her blood counts 3 months later.  She states her hemorrhoids continue to bother her frequently.  She has irritation, bleeding symptoms frequently.  She states the MiraLAX  helped her constipation she is not having any more constipation that bothers her.  She had tried Calmol 4 suppositories but they did not stay in.  She is interested in pursuing banding today.   PROCEDURE NOTE: CMA June Mc Murray in room during procedure The patient presents with symptomatic grade III  hemorrhoids, requesting rubber band ligation of his/her hemorrhoidal disease.  All risks, benefits and alternative forms of therapy were described and informed consent was obtained.   The anorectum was pre-medicated with 0125% nitroglycerin The decision was made to band the LL internal hemorrhoid, and the [facility] O'Regan System was used to perform band ligation without complication.  Digital anorectal examination was then performed to assure proper positioning of the band, and to adjust the banded tissue as required.  The patient was discharged home without pain or other issues.  Dietary and behavioral  recommendations were given and along with follow-up instructions.     The following adjunctive treatments were recommended: Patient had some sensitivity with DRE - no discomfort post procedure but given baseline sensitivity provided some topical nitroglycerin ointment to use PRN for rectal spasm post banding procedure  The patient will return in 2-4 weeks for  follow-up and possible additional banding as required. No complications were encountered and the patient tolerated the procedure well.  Otherwise, will send patient to the lab for CBC and TIBC ferritin panel to reassess response and see if she has held her iron.   Marcey Naval, MD [facility]

## 2023-07-23 ENCOUNTER — Other Ambulatory Visit (HOSPITAL_COMMUNITY)
Admission: RE | Admit: 2023-07-23 | Discharge: 2023-07-23 | Disposition: A | Discharge status: Home / Self Care | Payer: Medicaid Other | Source: Ambulatory Visit | Attending: Family Medicine | Admitting: Family Medicine

## 2023-07-23 ENCOUNTER — Encounter: Payer: Self-pay | Admitting: Student

## 2023-07-23 ENCOUNTER — Ambulatory Visit (INDEPENDENT_AMBULATORY_CARE_PROVIDER_SITE_OTHER): Payer: Medicaid Other | Admitting: Student

## 2023-07-23 VITALS — BP 136/86 | HR 89 | Ht 64.0 in | Wt 193.4 lb

## 2023-07-23 DIAGNOSIS — N898 Other specified noninflammatory disorders of vagina: Secondary | ICD-10-CM | POA: Diagnosis present

## 2023-07-23 DIAGNOSIS — R300 Dysuria: Secondary | ICD-10-CM | POA: Diagnosis not present

## 2023-07-23 LAB — POCT URINALYSIS DIP (MANUAL ENTRY)
Bilirubin, UA: NEGATIVE
Blood, UA: NEGATIVE
Glucose, UA: NEGATIVE mg/dL
Leukocytes, UA: NEGATIVE
Nitrite, UA: NEGATIVE
Spec Grav, UA: >=1.03 — AB (ref 1.010–1.025)
Urobilinogen, UA: 1 U/dL
pH, UA: 6.5 (ref 5.0–8.0)

## 2023-07-23 NOTE — Assessment & Plan Note (Signed)
 Copious watery, gray discharge present on exam. Collected vaginal swab for BV, yeast, GC/chlamydia, trichomonas. Will treat as appropriate when results return. Encouraged to continue to use condoms to prevent sexually transmitted infections.

## 2023-07-23 NOTE — Patient Instructions (Signed)
 It was great seeing you today.  As we discussed, - We collected testing today. I will call you if anything is abnormal or needs treatment. Otherwise, I will send you a MyChart message.   If you have any questions or concerns, please feel free to call the clinic.   Have a wonderful day,  Dr. Barabara Dama [facility] [phone]

## 2023-07-23 NOTE — Progress Notes (Signed)
    SUBJECTIVE:   CHIEF COMPLAINT / HPI:   [name] is a pleasant 43 year old female here for vaginal irritation and dysuria.  She says about a week ago she had sexual intercourse, that used a condom and the condom was left in the vagina for a day or so until she was able to take it out. Since then, she has had burning with urination, increased vaginal discharge and irritation. Would like to be tested for UTI and bacterial vaginosis or yeast infection.  PERTINENT  PMH / PSH:  External hemorrhoids  OBJECTIVE:   BP 136/86   Pulse 89   Ht 5' 4 (1.626 m)   Wt 193 lb 6 oz (87.7 kg)   LMP 07/01/2023   SpO2 100%   BMI 33.19 kg/m    General: NAD, well appearing Respiratory: Normal effort on room air Neuro: A&O Extremities: Moving all 4 extremities equally GU: Tashira, CMA present as chaperone during exam. External vulva and vagina nonerythematous, left labia majora with wart. External hemorrhoids seen. Normal ruggae of vaginal walls.  Cervix is non erythematous and non-friable. Copious watery, gray discharge present.    ASSESSMENT/PLAN:   Vaginal discharge Copious watery, gray discharge present on exam. Collected vaginal swab for BV, yeast, GC/chlamydia, trichomonas. Will treat as appropriate when results return. Encouraged to continue to use condoms to prevent sexually transmitted infections.    Jillisa Harris, DO [facility]

## 2023-07-24 ENCOUNTER — Telehealth: Payer: Self-pay

## 2023-07-24 ENCOUNTER — Encounter: Payer: Self-pay | Admitting: Gastroenterology

## 2023-07-24 NOTE — Telephone Encounter (Signed)
 Dr. Leigh, patient will be scheduled as soon as possible.  Auth Submission: NO AUTH NEEDED Site of care: Site of care: [facility] Payer: [facility] Medicaid Medication & CPT/J Code(s) submitted: Feraheme (ferumoxytol ) [phone] Route of submission (phone, fax, portal): portal Phone # Fax # Auth type: Buy/Bill PB Units/visits requested: 510mg  x 2 doses Reference number:  Approval from: 07/24/23 to 01/11/24   I confirmed on the [facility] portal that prior auth is not needed for Feraheme.

## 2023-07-24 NOTE — Addendum Note (Signed)
 Addended by: Marisa Sprinkles on: 07/24/2023 10:13 AM   Modules accepted: Orders

## 2023-07-24 NOTE — Telephone Encounter (Signed)
 Great, thank you!

## 2023-07-27 ENCOUNTER — Telehealth: Payer: Self-pay | Admitting: Gastroenterology

## 2023-07-27 LAB — CERVICOVAGINAL ANCILLARY ONLY
Bacterial Vaginitis (gardnerella): NEGATIVE
Candida Glabrata: NEGATIVE
Candida Vaginitis: NEGATIVE
Chlamydia: NEGATIVE
Comment: NEGATIVE
Comment: NEGATIVE
Comment: NEGATIVE
Comment: NEGATIVE
Comment: NEGATIVE
Comment: NORMAL
Neisseria Gonorrhea: NEGATIVE
Trichomonas: NEGATIVE

## 2023-07-27 NOTE — Telephone Encounter (Signed)
 Inbound call from patient, states compound cream medication is not covered by her insurance, she would like to discuss alternative.

## 2023-07-27 NOTE — Telephone Encounter (Signed)
 Patient was sent 0.125 % Nitroglycerin ointment to [facility] to be used prn after her banding.  Called but was unable to speak to patient   LM asking her to call back to discuss  her symptoms

## 2023-07-29 ENCOUNTER — Telehealth: Payer: Self-pay

## 2023-07-31 ENCOUNTER — Ambulatory Visit: Payer: Medicaid Other

## 2023-08-07 ENCOUNTER — Ambulatory Visit: Payer: Medicaid Other

## 2023-08-07 VITALS — BP 148/53 | HR 72 | Temp 98.5°F | Resp 18 | Ht 64.0 in | Wt 194.8 lb

## 2023-08-07 DIAGNOSIS — D509 Iron deficiency anemia, unspecified: Secondary | ICD-10-CM | POA: Diagnosis not present

## 2023-08-07 DIAGNOSIS — D500 Iron deficiency anemia secondary to blood loss (chronic): Secondary | ICD-10-CM

## 2023-08-07 MED ORDER — SODIUM CHLORIDE 0.9 % IV SOLN
510.0000 mg | Freq: Once | INTRAVENOUS | Status: AC
  Administered 2023-08-07: 510 mg via INTRAVENOUS
  Filled 2023-08-07: qty 17

## 2023-08-07 MED ORDER — ACETAMINOPHEN 325 MG PO TABS
650.0000 mg | ORAL_TABLET | Freq: Once | ORAL | Status: AC
  Administered 2023-08-07: 650 mg via ORAL
  Filled 2023-08-07: qty 2

## 2023-08-07 MED ORDER — DIPHENHYDRAMINE HCL 25 MG PO CAPS
25.0000 mg | ORAL_CAPSULE | Freq: Once | ORAL | Status: AC
Start: 2023-08-07 — End: 2023-08-07
  Administered 2023-08-07: 25 mg via ORAL
  Filled 2023-08-07: qty 1

## 2023-08-07 NOTE — Progress Notes (Signed)
Diagnosis: Iron Deficiency Anemia  Provider:  Chilton Greathouse MD  Procedure: IV Infusion  IV Type: Peripheral, IV Location: R Antecubital  Feraheme (Ferumoxytol), Dose: 510 mg  Infusion Start Time: 1201  Infusion Stop Time: 1222  Post Infusion IV Care: Observation period completed and Peripheral IV Discontinued  Discharge: Condition: Good, Destination: Home . AVS Declined  Performed by:  Adriana Mccallum, RN

## 2023-08-07 NOTE — Patient Instructions (Signed)

## 2023-08-14 ENCOUNTER — Ambulatory Visit: Payer: Medicaid Other

## 2023-08-14 VITALS — BP 124/77 | HR 78 | Temp 98.2°F | Resp 16 | Ht 63.0 in | Wt 194.4 lb

## 2023-08-14 DIAGNOSIS — D500 Iron deficiency anemia secondary to blood loss (chronic): Secondary | ICD-10-CM

## 2023-08-14 DIAGNOSIS — D509 Iron deficiency anemia, unspecified: Secondary | ICD-10-CM

## 2023-08-14 MED ORDER — DIPHENHYDRAMINE HCL 25 MG PO CAPS
25.0000 mg | ORAL_CAPSULE | Freq: Once | ORAL | Status: AC
  Administered 2023-08-14: 25 mg via ORAL
  Filled 2023-08-14: qty 1

## 2023-08-14 MED ORDER — SODIUM CHLORIDE 0.9 % IV SOLN
510.0000 mg | Freq: Once | INTRAVENOUS | Status: AC
  Administered 2023-08-14: 510 mg via INTRAVENOUS
  Filled 2023-08-14: qty 17

## 2023-08-14 MED ORDER — ACETAMINOPHEN 325 MG PO TABS
650.0000 mg | ORAL_TABLET | Freq: Once | ORAL | Status: AC
  Administered 2023-08-14: 650 mg via ORAL
  Filled 2023-08-14: qty 2

## 2023-08-14 NOTE — Progress Notes (Signed)
Diagnosis: Iron Deficiency Anemia  Provider:  Chilton Greathouse MD  Procedure: IV Infusion  IV Type: Peripheral, IV Location: L Antecubital  Feraheme (Ferumoxytol), Dose: 510 mg  Infusion Start Time: 1231  Infusion Stop Time: 1250  Post Infusion IV Care: Observation period completed and Peripheral IV Discontinued  Discharge: Condition: Good, Destination: Home . AVS Declined  Performed by:  Loney Hering, LPN

## 2023-08-21 ENCOUNTER — Ambulatory Visit: Payer: Medicaid Other | Attending: Family Medicine

## 2023-08-21 NOTE — Telephone Encounter (Signed)
 Patient Name: [name] MRN: [id number] DOB:1980/11/30, 43 y.o., female Today's Date: 08/21/2023  Patient was called today as she did not show for her physical therapy evaluation scheduled on 08/21/23 at 11 am. Pt was called back and reminded that she missed her appointment and she was asked to give us  call back if she would like to reschedule her appointment.    Raj LOISE Blanch, PT 08/21/2023, 11:23 AM

## 2023-08-26 ENCOUNTER — Encounter: Payer: Medicaid Other | Admitting: Obstetrics and Gynecology

## 2023-09-04 ENCOUNTER — Other Ambulatory Visit: Payer: Self-pay | Admitting: Student

## 2023-09-04 DIAGNOSIS — N946 Dysmenorrhea, unspecified: Secondary | ICD-10-CM

## 2023-09-17 ENCOUNTER — Telehealth: Payer: Self-pay | Admitting: Gastroenterology

## 2023-09-17 ENCOUNTER — Encounter: Payer: Medicaid Other | Admitting: Gastroenterology

## 2023-09-17 NOTE — Progress Notes (Deleted)
 Seen 07/21/23:  43 year old female here for follow-up visit for hemorrhoid banding.  Recall she was seen in July for grade 3 hemorrhoids, noted to have frequent rectal bleeding from this.  Has had some constipation in the past, told to take MiraLAX.  She was on oral iron supplementation in July and her hemoglobin came back at 9 with severe iron deficiency.  We gave her IV iron and she responded quite well to it.  Lab in September showed hemoglobin 12.1.   Since her last visit she had an EGD and colonoscopy with me in September for iron deficiency.  She had internal hemorrhoids that were large and thought to be the cause of her rectal bleeding and possibly iron deficiency.  She also has heavy menses.  EGD showed no clear cause for her iron deficiency.  H. pylori biopsies negative.  We had discussed performing hemorrhoid banding as well as following up her blood counts 3 months later.   She states her hemorrhoids continue to bother her frequently.  She has irritation, bleeding symptoms frequently.  She states the MiraLAX helped her constipation she is not having any more constipation that bothers her.  She had tried Calmol 4 suppositories but they did not "stay in".  She is interested in pursuing banding today.     PROCEDURE NOTE: CMA June Mc Murray in room during procedure The patient presents with symptomatic grade III  hemorrhoids, requesting rubber band ligation of his/her hemorrhoidal disease.  All risks, benefits and alternative forms of therapy were described and informed consent was obtained.     The anorectum was pre-medicated with 0125% nitroglycerin The decision was made to band the LL internal hemorrhoid, and the [facility] O'Regan System was used to perform band ligation without complication.  Digital anorectal examination was then performed to assure proper positioning of the band, and to adjust the banded tissue as required.  The patient was discharged home without pain or other issues.  Dietary  and behavioral recommendations were given and along with follow-up instructions.     The following adjunctive treatments were recommended: Patient had some sensitivity with DRE - no discomfort post procedure but given baseline sensitivity provided some topical nitroglycerin ointment to use PRN for rectal spasm post banding procedure   The patient will return in 2-4 weeks for  follow-up and possible additional banding as required. No complications were encountered and the patient tolerated the procedure well.   Otherwise, will send patient to the lab for CBC and TIBC ferritin panel to reassess response and see if she has held her iron.    Harlin Rain, MD [facility]

## 2023-09-17 NOTE — Telephone Encounter (Signed)
 Good morning Dr. Adela Lank,   Patient called stating that she wanted to cancel her banding with you today at 11:00.    Patient had an appointment for her 3rd banding on 3/27 and wanted to make that her 2nd banding appointment.

## 2023-09-17 NOTE — Telephone Encounter (Signed)
Okay thanks for letting us know.

## 2023-09-23 ENCOUNTER — Encounter: Payer: Self-pay | Admitting: Obstetrics and Gynecology

## 2023-09-23 ENCOUNTER — Ambulatory Visit (INDEPENDENT_AMBULATORY_CARE_PROVIDER_SITE_OTHER): Admitting: Student

## 2023-09-23 ENCOUNTER — Ambulatory Visit (INDEPENDENT_AMBULATORY_CARE_PROVIDER_SITE_OTHER): Payer: Medicaid Other | Admitting: Obstetrics and Gynecology

## 2023-09-23 ENCOUNTER — Encounter: Payer: Self-pay | Admitting: Student

## 2023-09-23 VITALS — BP 146/95 | HR 75 | Ht 63.0 in | Wt 198.8 lb

## 2023-09-23 VITALS — BP 144/78 | HR 75 | Temp 98.5°F | Ht 64.0 in | Wt 198.0 lb

## 2023-09-23 DIAGNOSIS — N939 Abnormal uterine and vaginal bleeding, unspecified: Secondary | ICD-10-CM | POA: Diagnosis not present

## 2023-09-23 DIAGNOSIS — D500 Iron deficiency anemia secondary to blood loss (chronic): Secondary | ICD-10-CM

## 2023-09-23 DIAGNOSIS — E500 Vitamin A deficiency with conjunctival xerosis: Secondary | ICD-10-CM | POA: Diagnosis not present

## 2023-09-23 DIAGNOSIS — M25559 Pain in unspecified hip: Secondary | ICD-10-CM | POA: Diagnosis present

## 2023-09-23 DIAGNOSIS — E559 Vitamin D deficiency, unspecified: Secondary | ICD-10-CM | POA: Diagnosis not present

## 2023-09-23 DIAGNOSIS — N946 Dysmenorrhea, unspecified: Secondary | ICD-10-CM | POA: Diagnosis not present

## 2023-09-23 DIAGNOSIS — I10 Essential (primary) hypertension: Secondary | ICD-10-CM | POA: Diagnosis not present

## 2023-09-23 DIAGNOSIS — R102 Pelvic and perineal pain: Secondary | ICD-10-CM | POA: Diagnosis not present

## 2023-09-23 LAB — PREGNANCY, URINE: Preg Test, Ur: NEGATIVE

## 2023-09-23 MED ORDER — NORETHINDRONE ACETATE 5 MG PO TABS: 5.0000 mg | ORAL_TABLET | Freq: Every day | ORAL | 2 refills | Status: DC

## 2023-09-23 MED ORDER — FLUTICASONE PROPIONATE 50 MCG/ACT NA SUSP: 1.0000 | Freq: Every day | NASAL | 2 refills | Status: DC

## 2023-09-23 MED ORDER — MELOXICAM 15 MG PO TABS: 15.0000 mg | ORAL_TABLET | Freq: Every day | ORAL | 0 refills | Status: DC

## 2023-09-23 NOTE — Progress Notes (Signed)
    SUBJECTIVE:   CHIEF COMPLAINT / HPI:   [name] is a 43 year-old female here to discuss bilateral greater trochanteric pain syndrome.  She reports that the pain is severe enough to disrupt her sleep and affect her work, as she is on her feet all day. The patient has received two steroid injections in the past, which provided temporary relief, but the effects have been short-lived. She has tried home exercises and oral meloxicam, neither of which have provided significant relief. The patient has not yet attended formal physical therapy.   PERTINENT  PMH / PSH:  Hypertension IDA  OBJECTIVE:   BP (!) 146/95   Pulse 75   Ht 5\' 3"  (1.6 m)   Wt 198 lb 12.8 oz (90.2 kg)   LMP 09/06/2023 (Approximate)   SpO2 100%   BMI 35.22 kg/m   General: No distress Cardiac: RRR Respiratory: Normal WOB on room air MSK: Ambulates independently without difficulty. Gait normal.  ASSESSMENT/PLAN:   Greater trochanteric pain syndrome Chronic bilateral hip pain, possibly due to bursitis, consider gluteus muscle tear, or labral tear. Patient very frustrated that she would not receive injection today. Extensively discussed concerns about risks of repeated steroid injections without further evaluation with imaging, formal PT. Advised against further injections until MRI rules out other pathologies. - Order hip MRI to evaluate for potential gluteus muscle tear or labral tear. - Refer to physical therapy for management of hip pain. - Refill meloxicam prescription. - Provide work accommodations if needed.     Darral Dash, DO [facility]

## 2023-09-23 NOTE — Progress Notes (Signed)
 43 y.o. [id number] female status post BTL, 2016, IDA (s/p IV Fe with GI) here for AUB. 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  Pt reports menstrual cycles last about two weeks.  Heavy bleeding and long cycles, progressing over the past couple of years. Back pain. Bleeding through clothing, using super tampons q34min. Goals- wants a regular period. Tried POP x2 months last year, stopped because no improvement in sx. Hgb- 9.8 No recent Gyn care 2/2 lack of insurance 3/27 hemorrhoid procedure scheduled  Patient's last menstrual period was 09/06/2023 (approximate). Period Duration (Days): 14 Period Pattern: Regular Menstrual Flow: Heavy (at the beginning) Menstrual Control: Maxi pad, Tampon Dysmenorrhea: (!) Severe Dysmenorrhea Symptoms: Nausea  Birth control: BTL  GYN HISTORY: Prior CD  OB History  Gravida Para Term Preterm AB Living  8 3 3  0 4 3  SAB IAB Ectopic Multiple Live Births  1 3  0 2    # Outcome Date GA Lbr Len/2nd Weight Sex Type Anes PTL Lv  8 Gravida           7 Term 11/03/14 [redacted]w[redacted]d  7 lb 15 oz (3.6 kg) F CS-LTranv Spinal  LIV  6 IAB           5 IAB           4 IAB           3 SAB           2 Term     F CS-LTranv  N      Birth Comments: repeat; AICU after del for BP  1 Term    10 lb 13 oz (4.905 kg) M CS-LTranv  N LIV    Past Medical History:  Diagnosis Date   Allergy    Anxiety    Arthralgia of left upper arm 08/07/2019   Cough 03/21/2016   no fever, states coughing up "snot colored cold stuff"-  has had this x 1.5 weeks and is getting much better- taking tylenol cold   Depression    never been treated , "probably got some "   Encounter for medical examination to establish care 12/11/2016   Gall stones    GERD without esophagitis 05/16/2014   Headache(784.0)    Hemorrhoids    S/P cesarean section 11/03/2014   Screening examination for STD (sexually transmitted disease) 12/11/2016   Sebaceous cyst 05/29/2022   Skin mole 12/11/2016   Skin nodule  05/08/2022   Stress 02/08/2023   Tooth ache    right upper molar partially broke off   Trichomonas vaginitis 02/08/2023   Wrist pain 01/02/2021    Past Surgical History:  Procedure Laterality Date   CESAREAN SECTION     CESAREAN SECTION WITH BILATERAL TUBAL LIGATION Bilateral 11/03/2014   Procedure: CESAREAN SECTION WITH BILATERAL TUBAL LIGATION;  Surgeon: Brock Bad, MD;  Location: [facility];  Service: Obstetrics;  Laterality: Bilateral;   CHOLECYSTECTOMY     CHOLECYSTECTOMY, LAPAROSCOPIC     DILATION AND CURETTAGE OF UTERUS     INCISIONAL HERNIA REPAIR N/A 03/27/2016   Procedure: LAPAROSCOPIC ASSISTED REPAIR OF INCARCERATED  INCISIONAL HERNIA;  Surgeon: Gaynelle Adu, MD;  Location: [facility];  Service: General;  Laterality: N/A;   INSERTION OF MESH N/A 03/27/2016   Procedure: INSERTION OF MESH;  Surgeon: Gaynelle Adu, MD;  Location: [facility];  Service: General;  Laterality: N/A;   THERAPEUTIC ABORTION     TUBAL LIGATION      Current Outpatient Medications on File Prior to  Visit  Medication Sig Dispense Refill   amLODipine (NORVASC) 10 MG tablet Take 1 tablet (10 mg total) by mouth at bedtime. 90 tablet 3   Azelastine HCl 137 MCG/SPRAY SOLN Place into the nose.     omeprazole (PRILOSEC) 20 MG capsule Take 1 capsule (20 mg total) by mouth daily. 90 capsule 3   phenylephrine-shark liver oil-mineral oil-petrolatum (PREPARATION H) 0.25-14-74.9 % rectal ointment Place 1 Application rectally 2 (two) times daily as needed for hemorrhoids. 56 g 0   No current facility-administered medications on file prior to visit.    Allergies  Allergen Reactions   Levofloxacin Itching      PE Today's Vitals   09/23/23 1456  BP: (!) 144/78  Pulse: 75  Temp: 98.5 F (36.9 C)  TempSrc: Oral  SpO2: 97%  Weight: 198 lb (89.8 kg)  Height: 5\' 4"  (1.626 m)   Body mass index is 33.99 kg/m.  Physical Exam Vitals reviewed.  Constitutional:      General: She is not in acute distress.     Appearance: Normal appearance.  HENT:     Head: Normocephalic and atraumatic.     Nose: Nose normal.  Eyes:     Extraocular Movements: Extraocular movements intact.     Conjunctiva/sclera: Conjunctivae normal.  Pulmonary:     Effort: Pulmonary effort is normal.  Musculoskeletal:        General: Normal range of motion.     Cervical back: Normal range of motion.  Neurological:     General: No focal deficit present.     Mental Status: She is alert.  Psychiatric:        Mood and Affect: Mood normal.        Behavior: Behavior normal.       Assessment and Plan:        Abnormal uterine bleeding (AUB) Assessment & Plan: Will complete hormonal work-up, [place] and EMB if >45yo or risk factors for endometrial hyperplasia or malignancy. First line therapies reviewed, including hormonal therapy and tranexamic acid. Also discussed endometrial ablation, uterine artery embolization and hysterectomy. Patient is interested in aygestin until ablation. Pamphlets provided. RTO for [place] +/- EMB. Will discuss further management at that time.   Orders: -     Norethindrone Acetate; Take 1 tablet (5 mg total) by mouth daily.  Dispense: 30 tablet; Refill: 2 -     Thyroid Panel With TSH -     FSH/LH -     Prolactin -     Pregnancy, urine -     US PELVIS TRANSVAGINAL NON-OB (TV ONLY); Future -     Endometrial biopsy; Future  Iron deficiency anemia due to chronic blood loss   Continue IV Fe with GI  Rosalyn Gess, MD

## 2023-09-23 NOTE — Patient Instructions (Signed)
 It was great seeing you today.  As we discussed, - Physical therapy referral placed- you will be called to schedule - Meloxicam refilled, take with food - MRIs of both hips, you will be called to schedule - Flonase for allergies sent to pharmacy   If you have any questions or concerns, please feel free to call the clinic.   Have a wonderful day,  Dr. Darral Dash [facility] [phone]

## 2023-09-23 NOTE — Assessment & Plan Note (Signed)
 Will complete hormonal work-up, [place] and EMB if >45yo or risk factors for endometrial hyperplasia or malignancy. First line therapies reviewed, including hormonal therapy and tranexamic acid. Also discussed endometrial ablation, uterine artery embolization and hysterectomy. Patient is interested in aygestin until ablation. Pamphlets provided. RTO for [place] +/- EMB. Will discuss further management at that time.

## 2023-09-24 ENCOUNTER — Encounter: Payer: Self-pay | Admitting: Obstetrics and Gynecology

## 2023-09-24 DIAGNOSIS — N939 Abnormal uterine and vaginal bleeding, unspecified: Secondary | ICD-10-CM

## 2023-09-24 LAB — THYROID PANEL WITH TSH
Free Thyroxine Index: 1.9 (ref 1.4–3.8)
T3 Uptake: 26 % (ref 22–35)
T4, Total: 7.2 ug/dL (ref 5.1–11.9)
TSH: 0.67 m[IU]/L

## 2023-09-24 LAB — PROLACTIN: Prolactin: 5.1 ng/mL

## 2023-09-24 LAB — FSH/LH
FSH: 17.5 m[IU]/mL
LH: 11.5 m[IU]/mL

## 2023-09-27 ENCOUNTER — Other Ambulatory Visit: Payer: Self-pay | Admitting: Student

## 2023-09-27 ENCOUNTER — Encounter: Payer: Self-pay | Admitting: Student

## 2023-09-27 NOTE — Assessment & Plan Note (Signed)
 Chronic bilateral hip pain, possibly due to bursitis, consider gluteus muscle tear, or labral tear. Patient very frustrated that she would not receive injection today. Extensively discussed concerns about risks of repeated steroid injections without further evaluation with imaging, formal PT. Advised against further injections until MRI rules out other pathologies. - Order hip MRI to evaluate for potential gluteus muscle tear or labral tear. - Refer to physical therapy for management of hip pain. - Refill meloxicam prescription. - Provide work accommodations if needed.

## 2023-10-03 ENCOUNTER — Ambulatory Visit (HOSPITAL_COMMUNITY)
Admission: RE | Admit: 2023-10-03 | Discharge: 2023-10-03 | Disposition: A | Discharge status: Home / Self Care | Source: Ambulatory Visit | Attending: Family Medicine | Admitting: Family Medicine

## 2023-10-03 DIAGNOSIS — M25559 Pain in unspecified hip: Secondary | ICD-10-CM

## 2023-10-03 DIAGNOSIS — M25552 Pain in left hip: Secondary | ICD-10-CM | POA: Diagnosis not present

## 2023-10-03 DIAGNOSIS — M7601 Gluteal tendinitis, right hip: Secondary | ICD-10-CM | POA: Diagnosis not present

## 2023-10-03 DIAGNOSIS — M7602 Gluteal tendinitis, left hip: Secondary | ICD-10-CM | POA: Diagnosis not present

## 2023-10-03 DIAGNOSIS — D259 Leiomyoma of uterus, unspecified: Secondary | ICD-10-CM | POA: Diagnosis not present

## 2023-10-05 ENCOUNTER — Ambulatory Visit: Attending: Family Medicine

## 2023-10-08 ENCOUNTER — Ambulatory Visit (INDEPENDENT_AMBULATORY_CARE_PROVIDER_SITE_OTHER): Payer: Medicaid Other | Admitting: Gastroenterology

## 2023-10-08 ENCOUNTER — Other Ambulatory Visit (INDEPENDENT_AMBULATORY_CARE_PROVIDER_SITE_OTHER)

## 2023-10-08 ENCOUNTER — Encounter: Payer: Self-pay | Admitting: Student

## 2023-10-08 ENCOUNTER — Encounter: Payer: Self-pay | Admitting: Gastroenterology

## 2023-10-08 VITALS — BP 122/70 | HR 68 | Ht 64.0 in | Wt 199.0 lb

## 2023-10-08 DIAGNOSIS — K642 Third degree hemorrhoids: Secondary | ICD-10-CM

## 2023-10-08 DIAGNOSIS — D509 Iron deficiency anemia, unspecified: Secondary | ICD-10-CM

## 2023-10-08 LAB — CBC WITH DIFFERENTIAL/PLATELET
Basophils Absolute: 0.1 10*3/uL (ref 0.0–0.1)
Basophils Relative: 0.9 % (ref 0.0–3.0)
Eosinophils Absolute: 0.1 10*3/uL (ref 0.0–0.7)
Eosinophils Relative: 0.8 % (ref 0.0–5.0)
HCT: 37.2 % (ref 36.0–46.0)
Hemoglobin: 12.7 g/dL (ref 12.0–15.0)
Lymphocytes Relative: 28.4 % (ref 12.0–46.0)
Lymphs Abs: 2.3 10*3/uL (ref 0.7–4.0)
MCHC: 34.2 g/dL (ref 30.0–36.0)
MCV: 93.4 fl (ref 78.0–100.0)
Monocytes Absolute: 0.5 10*3/uL (ref 0.1–1.0)
Monocytes Relative: 5.8 % (ref 3.0–12.0)
Neutro Abs: 5.3 10*3/uL (ref 1.4–7.7)
Neutrophils Relative %: 64.1 % (ref 43.0–77.0)
Platelets: 215 10*3/uL (ref 150.0–400.0)
RBC: 3.98 Mil/uL (ref 3.87–5.11)
RDW: 19.8 % — ABNORMAL HIGH (ref 11.5–15.5)
WBC: 8.2 10*3/uL (ref 4.0–10.5)

## 2023-10-08 NOTE — Patient Instructions (Signed)
 Please go to the lab in the basement of our building to have lab work done as you leave today. Hit "B" for basement when you get on the elevator.  When the doors open the lab is on your left.  We will call you with the results. Thank you.  You have been scheduled for your 3rd banding appointment on Wednesday, 6-25- at 4:00 pm. Please arrive 10 minutes early for registration. If you need to reschedule or cancel this appointment please call [phone] as soon as possible. Thank you.   HEMORRHOID BANDING PROCEDURE    FOLLOW-UP CARE   The procedure you have had should have been relatively painless since the banding of the area involved does not have nerve endings and there is no pain sensation.  The rubber band cuts off the blood supply to the hemorrhoid and the band may fall off as soon as 48 hours after the banding (the band may occasionally be seen in the toilet bowl following a bowel movement). You may notice a temporary feeling of fullness in the rectum which should respond adequately to plain Tylenol or Motrin.  Following the banding, avoid strenuous exercise that evening and resume full activity the next day.  A sitz bath (soaking in a warm tub) or bidet is soothing, and can be useful for cleansing the area after bowel movements.     To avoid constipation, take two tablespoons of natural wheat bran, natural oat bran, flax, Benefiber or any over the counter fiber supplement and increase your water intake to 7-8 glasses daily.    Unless you have been prescribed anorectal medication, do not put anything inside your rectum for two weeks: No suppositories, enemas, fingers, etc.  Occasionally, you may have more bleeding than usual after the banding procedure.  This is often from the untreated hemorrhoids rather than the treated one.  Don't be concerned if there is a tablespoon or so of blood.  If there is more blood than this, lie flat with your bottom higher than your head and apply an ice pack  to the area. If the bleeding does not stop within a half an hour or if you feel faint, call our office at [phone] or go to the emergency room.  Problems are not common; however, if there is a substantial amount of bleeding, severe pain, chills, fever or difficulty passing urine (very rare) or other problems, you should call us at [phone] or report to the nearest emergency room.  Do not stay seated continuously for more than 2-3 hours for a day or two after the procedure.  Tighten your buttock muscles 10-15 times every two hours and take 10-15 deep breaths every 1-2 hours.  Do not spend more than a few minutes on the toilet if you cannot empty your bowel; instead re-visit the toilet at a later time.  Thank you for entrusting me with your care and for choosing [facility], [place], Dr. Ileene Patrick    If your blood pressure at your visit was 140/90 or greater, please contact your primary care physician to follow up on this. ______________________________________________________  If you are age 7 or older, your body mass index should be between 23-30. Your Body mass index is 34.16 kg/m. If this is out of the aforementioned range listed, please consider follow up with your Primary Care Provider.  If you are age 3 or younger, your body mass index should be between 19-25. Your Body mass index is 34.16 kg/m. If this is  out of the aformentioned range listed, please consider follow up with your Primary Care Provider.  ________________________________________________________  The [place] [facility] providers would like to encourage you to use [facility] to communicate with providers for non-urgent requests or questions.  Due to long hold times on the telephone, sending your provider a message by [facility] may be a faster and more efficient way to get a response.  Please allow 48 business hours for a response.  Please remember that this is for non-urgent requests.   _______________________________________________________  Due to recent changes in healthcare laws, you may see the results of your imaging and laboratory studies on MyChart before your provider has had a chance to review them.  We understand that in some cases there may be results that are confusing or concerning to you. Not all laboratory results come back in the same time frame and the provider may be waiting for multiple results in order to interpret others.  Please give [place] 48 hours in order for your provider to thoroughly review all the results before contacting the office for clarification of your results.

## 2023-10-08 NOTE — Progress Notes (Signed)
  43 year old female here for follow-up visit for hemorrhoid banding.  Recall she was seen in July 2024 for grade 3 hemorrhoids, noted to have frequent rectal bleeding from this.  Has had some constipation in the past, told to take MiraLAX.  She has had iron deficiency associated with this. EGD and colonoscopy with me in September for iron deficiency.  She had internal hemorrhoids that were large and thought to be the cause of her rectal bleeding and possibly iron deficiency.  She also has heavy menses.  EGD showed no clear cause for her iron deficiency.  H. pylori biopsies negative.   She has received IV iron and following with gynecology for her heavy menses, now on norethindrone which she staves is really helping her menstrual bleeding.  She inquires about having blood work today to make sure counts are stable.  She has not had IV iron in several months.  Otherwise had left lateral hemorrhoid banded in January.  She states she thinks the band fell off about 24 hours later, sooner than she had anticipated.  She still has some bleeding from hemorrhoids that bothers her.  She denies constipation currently, takes MiraLAX as needed.  She wishes to proceed with additional banding today      PROCEDURE NOTE: CMA Lucius Conn in procedure room today for procedure The patient presents with symptomatic grade III  hemorrhoids, requesting rubber band ligation of his/her hemorrhoidal disease.  All risks, benefits and alternative forms of therapy were described and informed consent was obtained.     The anorectum was pre-medicated with 0125% nitroglycerin The decision was made to band the RP internal hemorrhoid, and the [facility] O'Regan System was used to perform band ligation without complication.  Digital anorectal examination was then performed to assure proper positioning of the band, and to adjust the banded tissue as required.  The patient was discharged home without pain or other issues.  Dietary and behavioral  recommendations were given and along with follow-up instructions.      The patient will returnfor  follow-up and possible additional banding as required. No complications were encountered and the patient tolerated the procedure well.   Otherwise, will send patient to the lab for CBC and TIBC ferritin panel to reassess response and see if she has held her iron.    Harlin Rain, MD [facility]

## 2023-10-09 ENCOUNTER — Encounter: Payer: Self-pay | Admitting: Gastroenterology

## 2023-10-09 LAB — IBC + FERRITIN
Ferritin: 13.3 ng/mL (ref 10.0–291.0)
Iron: 84 ug/dL (ref 42–145)
Saturation Ratios: 23.8 % (ref 20.0–50.0)
TIBC: 352.8 ug/dL (ref 250.0–450.0)
Transferrin: 252 mg/dL (ref 212.0–360.0)

## 2023-10-15 ENCOUNTER — Other Ambulatory Visit

## 2023-10-15 ENCOUNTER — Other Ambulatory Visit: Admitting: Obstetrics and Gynecology

## 2023-10-19 ENCOUNTER — Other Ambulatory Visit: Payer: Self-pay | Admitting: Student

## 2023-10-19 DIAGNOSIS — M160 Bilateral primary osteoarthritis of hip: Secondary | ICD-10-CM

## 2023-10-19 NOTE — Progress Notes (Signed)
 Referral to sports medicine placed for bilateral hip osteoarthritis- discuss intra-articular injection.

## 2023-10-19 NOTE — Telephone Encounter (Signed)
 Pt also LVM in triage line regarding this same subject. Additionally, stated that she would like a medication that will take away her bleeding completely/permanently.

## 2023-10-20 ENCOUNTER — Encounter: Payer: Self-pay | Admitting: Gastroenterology

## 2023-10-20 ENCOUNTER — Ambulatory Visit (INDEPENDENT_AMBULATORY_CARE_PROVIDER_SITE_OTHER): Admitting: Gastroenterology

## 2023-10-20 VITALS — BP 140/76 | HR 92 | Ht 64.0 in | Wt 197.2 lb

## 2023-10-20 DIAGNOSIS — K649 Unspecified hemorrhoids: Secondary | ICD-10-CM

## 2023-10-20 DIAGNOSIS — K625 Hemorrhage of anus and rectum: Secondary | ICD-10-CM | POA: Diagnosis not present

## 2023-10-20 DIAGNOSIS — K602 Anal fissure, unspecified: Secondary | ICD-10-CM

## 2023-10-20 MED ORDER — AMBULATORY NON FORMULARY MEDICATION: 1 refills | Status: DC

## 2023-10-20 MED ORDER — BENEFIBER PO POWD: ORAL | Status: DC

## 2023-10-20 NOTE — Patient Instructions (Signed)
 Please purchase the following medications over the counter and take as directed: Fiber supplement: Use daily  We have sent a prescription for Diltiazem gel with 2% lidocaine to [facility]. You should apply a pea size amount to your rectum three times daily or until healed  [facility] information is below: Address: [address], [place], [place] [address]  [phone]  *Please DO NOT go directly from our office to pick up this medication! Give the pharmacy 1 day to process the prescription as this is compounded and takes time to make.   Thank you for entrusting me with your care and for choosing [facility], Dr. Ileene Patrick    If your blood pressure at your visit was 140/90 or greater, please contact your primary care physician to follow up on this. ______________________________________________________  If you are age 60 or older, your body mass index should be between 23-30. Your Body mass index is 33.86 kg/m. If this is out of the aforementioned range listed, please consider follow up with your Primary Care Provider.  If you are age 50 or younger, your body mass index should be between 19-25. Your Body mass index is 33.86 kg/m. If this is out of the aformentioned range listed, please consider follow up with your Primary Care Provider.  ________________________________________________________  The [place] [facility] providers would like to encourage you to use [facility] to communicate with providers for non-urgent requests or questions.  Due to long hold times on the telephone, sending your provider a message by [facility] may be a faster and more efficient way to get a response.  Please allow 48 business hours for a response.  Please remember that this is for non-urgent requests.  _______________________________________________________  Due to recent changes in healthcare laws, you may see the results of your imaging and laboratory studies on MyChart  before your provider has had a chance to review them.  We understand that in some cases there may be results that are confusing or concerning to you. Not all laboratory results come back in the same time frame and the provider may be waiting for multiple results in order to interpret others.  Please give [place] 48 hours in order for your provider to thoroughly review all the results before contacting the office for clarification of your results.

## 2023-10-20 NOTE — Progress Notes (Signed)
 HPI :  43 year old female here for follow-up visit for rectal bleeding / rectal pain.  Recall she was seen in July 2024 for grade 3 hemorrhoids, noted to have frequent rectal bleeding from this.  Has had some constipation in the past, told to take MiraLAX.  She has had iron deficiency associated with this. EGD and colonoscopy with me in September 2024 for iron deficiency.  She had internal hemorrhoids that were large and thought to be the cause of her rectal bleeding and possibly iron deficiency.  She also has heavy menses.  EGD showed no clear cause for her iron deficiency.  H. pylori biopsies negative.    She has received IV iron and following with gynecology for her heavy menses, now on norethindrone which she staves is really helping her menstrual bleeding.    Recall she had the left lateral hemorrhoid banded in January, and then had the RP hemorrhoid banded on March 27.  She states she has not found much of any benefit since the banding so far..  She has been experiencing sharp pains in her rectum with bowel movements in recent days.  She is trying not to strain, not currently taking anything for her bowels.  She feels after 2 banding she has not had improvement yet and continues to have bleeding.   Past Medical History:  Diagnosis Date   Allergy    Anxiety    Arthralgia of left upper arm 08/07/2019   Bacterial vaginosis 06/18/2023   Cough 03/21/2016   no fever, states coughing up "snot colored cold stuff"-  has had this x 1.5 weeks and is getting much better- taking tylenol cold   Depression    never been treated , "probably got some "   Encounter for medical examination to establish care 12/11/2016   Gall stones    GERD without esophagitis 05/16/2014   Headache(784.0)    Hemorrhoids    S/P cesarean section 11/03/2014   Screening examination for STD (sexually transmitted disease) 12/11/2016   Screening examination for STI 03/03/2023   Sebaceous cyst 05/29/2022   Skin mole  12/11/2016   Skin nodule 05/08/2022   Stress 02/08/2023   Tooth ache    right upper molar partially broke off   Trichomonas vaginitis 02/08/2023   Vaginal discharge 01/02/2021   Vitamin D deficiency 11/13/2016   Wrist pain 01/02/2021     Past Surgical History:  Procedure Laterality Date   CESAREAN SECTION     CESAREAN SECTION WITH BILATERAL TUBAL LIGATION Bilateral 11/03/2014   Procedure: CESAREAN SECTION WITH BILATERAL TUBAL LIGATION;  Surgeon: Brock Bad, MD;  Location: [facility];  Service: Obstetrics;  Laterality: Bilateral;   CHOLECYSTECTOMY     CHOLECYSTECTOMY, LAPAROSCOPIC     DILATION AND CURETTAGE OF UTERUS     INCISIONAL HERNIA REPAIR N/A 03/27/2016   Procedure: LAPAROSCOPIC ASSISTED REPAIR OF INCARCERATED  INCISIONAL HERNIA;  Surgeon: Gaynelle Adu, MD;  Location: [facility];  Service: General;  Laterality: N/A;   INSERTION OF MESH N/A 03/27/2016   Procedure: INSERTION OF MESH;  Surgeon: Gaynelle Adu, MD;  Location: [facility];  Service: General;  Laterality: N/A;   THERAPEUTIC ABORTION     TUBAL LIGATION     Family History  Problem Relation Age of Onset   Diabetes Father    Hypertension Father    Hyperlipidemia Father    Cancer Maternal Aunt        breast   Hypertension Paternal Grandmother    Stroke Paternal Grandmother  Stomach cancer Neg Hx    Esophageal cancer Neg Hx    Colon cancer Neg Hx    Rectal cancer Neg Hx    Social History   Tobacco Use   Smoking status: Former    Current packs/day: 0.00    Types: Cigarettes    Quit date: 07/15/2015    Years since quitting: 8.2   Smokeless tobacco: Never   Tobacco comments:    prior to preg  Vaping Use   Vaping status: Never Used  Substance Use Topics   Alcohol use: No    Alcohol/week: 0.0 standard drinks of alcohol   Drug use: Yes    Types: Marijuana   Current Outpatient Medications  Medication Sig Dispense Refill   AMBULATORY NON FORMULARY MEDICATION Diltiazem gel with 2% lidocaine  Apply a pea sized amount  into your rectum three times daily until healed Dispense 30 GM zero refill 30 g 1   amLODipine (NORVASC) 10 MG tablet Take 1 tablet (10 mg total) by mouth at bedtime. 90 tablet 3   Azelastine HCl 137 MCG/SPRAY SOLN Place into the nose.     fluticasone (FLONASE) 50 MCG/ACT nasal spray Place 1 spray into both nostrils daily. 18.2 mL 2   meloxicam (MOBIC) 15 MG tablet Take 1 tablet (15 mg total) by mouth daily. 30 tablet 0   norethindrone (AYGESTIN) 5 MG tablet Take 1 tablet (5 mg total) by mouth daily. 30 tablet 2   omeprazole (PRILOSEC) 20 MG capsule Take 1 capsule (20 mg total) by mouth daily. 90 capsule 3   phenylephrine-shark liver oil-mineral oil-petrolatum (PREPARATION H) 0.25-14-74.9 % rectal ointment Place 1 Application rectally 2 (two) times daily as needed for hemorrhoids. 56 g 0   Wheat Dextrin (BENEFIBER) POWD Use as directed, daily     No current facility-administered medications for this visit.   Allergies  Allergen Reactions   Levofloxacin Itching     Review of Systems: All systems reviewed and negative except where noted in HPI.      Physical Exam: BP (!) 140/76   Pulse 92   Ht 5\' 4"  (1.626 m)   Wt 197 lb 4 oz (89.5 kg)   LMP 09/06/2023 (Approximate)   BMI 33.86 kg/m  Constitutional: Pleasant,well-developed, female in no acute distress. DRE - Teena Irani PA as standby - posterior midline anal fissure noted on perianal exam Psychiatric: Normal mood and affect. Behavior is normal.   ASSESSMENT: 43 y.o. female here for assessment of the following  1. Anal fissure   2. Rectal bleeding   3. Hemorrhoids, unspecified hemorrhoid type    Status post 2 hemorrhoid banding's, last was less than 2 weeks ago so hard to say that she has truly failed this yet but she has not really improved and now with some pain and continued bleeding.  On DRE she has a new posterior midline anal fissure.  I think that is likely causing her more recent symptoms.  Discussed options with  her, recommended topical nitroglycerin previously for discomfort she was having and she could not afford it.  We [facility], will try diltiazem/lidocaine gel, apply pea-sized amount 3 times daily for relief until healed.  Hopefully she can afford this, as we do not have great options for treatment for this otherwise in regards to ointments, which can only be obtained at compound pharmacies.  Otherwise recommend a daily fiber supplement to keep stools soft.  No plans for additional hemorrhoid banding at this time especially in the setting  of fissure.  Will see how she responds to management of anal fissure.  If her symptoms persist despite treatment then we will need to consider referral to colorectal surgery.  She agrees, will await her course, she will contact me with additional questions or concerns.  Harlin Rain, MD [facility]

## 2023-10-21 MED ORDER — ONDANSETRON HCL 8 MG PO TABS: 8.0000 mg | ORAL_TABLET | Freq: Three times a day (TID) | ORAL | 0 refills | Status: DC | PRN

## 2023-10-21 NOTE — Telephone Encounter (Signed)
 Spoke with patient. Patient is scheduled for PUS and EMB on 11/10/23. Patient is requesting late afternoon appt, will keep appt as scheduled.   Rx for Zofran was not previously sent, pharmacy confirmed, RX sent.   Patient will need new RX for Aygestin as prescribed tid. Rx pended.   Patient states bleeding has slowed down. Patient will call if any new symptoms develop or if bleeding becomes heavy.

## 2023-10-21 NOTE — Telephone Encounter (Signed)
 Left message to call [facility] Triage at [phone], option 4.

## 2023-10-22 MED ORDER — NORETHINDRONE ACETATE 5 MG PO TABS: 5.0000 mg | ORAL_TABLET | Freq: Three times a day (TID) | ORAL | 2 refills | Status: DC

## 2023-10-23 ENCOUNTER — Other Ambulatory Visit: Payer: Self-pay | Admitting: Radiology

## 2023-10-23 ENCOUNTER — Encounter: Payer: Self-pay | Admitting: Obstetrics and Gynecology

## 2023-10-23 DIAGNOSIS — N939 Abnormal uterine and vaginal bleeding, unspecified: Secondary | ICD-10-CM

## 2023-10-23 MED ORDER — TRANEXAMIC ACID 650 MG PO TABS: 1300.0000 mg | ORAL_TABLET | Freq: Three times a day (TID) | ORAL | 0 refills | Status: AC

## 2023-10-23 NOTE — Telephone Encounter (Signed)
 Stop Aygestin for now. Rx for lysteda sent to pharmacy on file. May take 2tabs up tp 3 times a day. Schedule OV with Dr Kennith Center next week

## 2023-10-23 NOTE — Telephone Encounter (Signed)
 Refer to encounter dated 09/24/2023. Encounter closed.

## 2023-11-02 ENCOUNTER — Encounter: Payer: Self-pay | Admitting: Gastroenterology

## 2023-11-10 ENCOUNTER — Encounter: Payer: Self-pay | Admitting: Obstetrics and Gynecology

## 2023-11-10 ENCOUNTER — Other Ambulatory Visit: Payer: Self-pay | Admitting: Obstetrics and Gynecology

## 2023-11-10 ENCOUNTER — Other Ambulatory Visit (HOSPITAL_COMMUNITY)
Admission: RE | Admit: 2023-11-10 | Discharge: 2023-11-10 | Disposition: A | Discharge status: Home / Self Care | Source: Ambulatory Visit | Attending: Obstetrics and Gynecology | Admitting: Obstetrics and Gynecology

## 2023-11-10 ENCOUNTER — Ambulatory Visit (INDEPENDENT_AMBULATORY_CARE_PROVIDER_SITE_OTHER): Admitting: Obstetrics and Gynecology

## 2023-11-10 ENCOUNTER — Other Ambulatory Visit: Payer: Self-pay | Admitting: Student

## 2023-11-10 ENCOUNTER — Ambulatory Visit (INDEPENDENT_AMBULATORY_CARE_PROVIDER_SITE_OTHER)

## 2023-11-10 ENCOUNTER — Ambulatory Visit: Admitting: Family Medicine

## 2023-11-10 VITALS — BP 118/76 | HR 66 | Resp 14 | Ht 64.0 in | Wt 197.0 lb

## 2023-11-10 DIAGNOSIS — D259 Leiomyoma of uterus, unspecified: Secondary | ICD-10-CM

## 2023-11-10 DIAGNOSIS — N939 Abnormal uterine and vaginal bleeding, unspecified: Secondary | ICD-10-CM | POA: Diagnosis not present

## 2023-11-10 DIAGNOSIS — Z124 Encounter for screening for malignant neoplasm of cervix: Secondary | ICD-10-CM | POA: Insufficient documentation

## 2023-11-10 DIAGNOSIS — Z01812 Encounter for preprocedural laboratory examination: Secondary | ICD-10-CM

## 2023-11-10 DIAGNOSIS — D500 Iron deficiency anemia secondary to blood loss (chronic): Secondary | ICD-10-CM

## 2023-11-10 LAB — PREGNANCY, URINE: Preg Test, Ur: NEGATIVE

## 2023-11-10 MED ORDER — MYFEMBREE 40-1-0.5 MG PO TABS: 1.0000 | ORAL_TABLET | Freq: Every day | ORAL | 11 refills | Status: DC

## 2023-11-10 MED ORDER — LIDOCAINE HCL (PF) 1 % IJ SOLN: 10.0000 mL | Freq: Once | INTRAMUSCULAR | Status: DC

## 2023-11-10 NOTE — Progress Notes (Signed)
 43 y.o. [id number] female status post BTL, 2016, IDA (s/p IV Fe with GI), AUB, HTN here for TVUS and endometrial biopsy. 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  Patient's last menstrual period was 10/15/2023.   At 09/23/23 appt, she noted: menstrual cycles last about two weeks.  Heavy bleeding and long cycles, progressing over the past couple of years. Back pain. Bleeding through clothing, using super tampons q30min. Goals- wants a regular period. Tried POP x2 months last year, stopped because no improvement in sx. Hgb- 9.8 No recent Gyn care 2/2 lack of insurance 3/27 hemorrhoid procedure scheduled  She was started on aygestin , which was titrated over 1 month from 5 mg daily to 15 mg daily, without improvement in her bleeding.  She was then started on lysteda  on 10/23/2023. She restarted aygestin  TID afte.  r that. She has not had bleeding since. Patient having severe premenstrual nausea and dysmenorrhea.  Affecting work and this is a new job for her.  She is a single parent and still on probation. Wants to feel better. No CP or SOB.  09/23/2023 FSH, LH, prolactin, TSH, UPT normal 10/08/2023 hemoglobin 12.7, improved from 9.7 on 07/21/2023  Birth control: BTL  GYN HISTORY: Prior CD  OB History  Gravida Para Term Preterm AB Living  7 3 3  0 4 3  SAB IAB Ectopic Multiple Live Births  1 3 0 0 3    # Outcome Date GA Lbr Len/2nd Weight Sex Type Anes PTL Lv  7 Term 11/03/14 [redacted]w[redacted]d  7 lb 15 oz (3.6 kg) F CS-LTranv Spinal  LIV  6 IAB           5 IAB           4 IAB           3 SAB           2 Term     F CS-LTranv  N      Birth Comments: repeat; AICU after del for BP  1 Term    10 lb 13 oz (4.905 kg) M CS-LTranv  N LIV    Past Medical History:  Diagnosis Date   Allergy    Anxiety    Arthralgia of left upper arm 08/07/2019   Bacterial vaginosis 06/18/2023   Cough 03/21/2016   no fever, states coughing up "snot colored cold stuff"-  has had this x 1.5 weeks and is getting much  better- taking tylenol  cold   Depression    never been treated , "probably got some "   Encounter for medical examination to establish care 12/11/2016   Gall stones    GERD without esophagitis 05/16/2014   Headache(784.0)    Hemorrhoids    S/P cesarean section 11/03/2014   Screening examination for STD (sexually transmitted disease) 12/11/2016   Screening examination for STI 03/03/2023   Sebaceous cyst 05/29/2022   Skin mole 12/11/2016   Skin nodule 05/08/2022   Stress 02/08/2023   Tooth ache    right upper molar partially broke off   Trichomonas vaginitis 02/08/2023   Vaginal discharge 01/02/2021   Vitamin D  deficiency 11/13/2016   Wrist pain 01/02/2021    Past Surgical History:  Procedure Laterality Date   CESAREAN SECTION     CESAREAN SECTION WITH BILATERAL TUBAL LIGATION Bilateral 11/03/2014   Procedure: CESAREAN SECTION WITH BILATERAL TUBAL LIGATION;  Surgeon: Gabrielle Joiner, MD;  Location: [facility];  Service: Obstetrics;  Laterality: Bilateral;   CHOLECYSTECTOMY     CHOLECYSTECTOMY,  LAPAROSCOPIC     DILATION AND CURETTAGE OF UTERUS     INCISIONAL HERNIA REPAIR N/A 03/27/2016   Procedure: LAPAROSCOPIC ASSISTED REPAIR OF INCARCERATED  INCISIONAL HERNIA;  Surgeon: Aldean Hummingbird, MD;  Location: [facility];  Service: General;  Laterality: N/A;   INSERTION OF MESH N/A 03/27/2016   Procedure: INSERTION OF MESH;  Surgeon: Aldean Hummingbird, MD;  Location: [facility];  Service: General;  Laterality: N/A;   THERAPEUTIC ABORTION     TUBAL LIGATION      Current Outpatient Medications on File Prior to Visit  Medication Sig Dispense Refill   AMBULATORY NON FORMULARY MEDICATION Diltiazem gel with 2% lidocaine   Apply a pea sized amount into your rectum three times daily until healed Dispense 30 GM zero refill 30 g 1   amLODipine  (NORVASC ) 10 MG tablet Take 1 tablet (10 mg total) by mouth at bedtime. 90 tablet 3   Azelastine HCl 137 MCG/SPRAY SOLN Place into the nose.     fluticasone  (FLONASE ) 50  MCG/ACT nasal spray Place 1 spray into both nostrils daily. 18.2 mL 2   omeprazole  (PRILOSEC) 20 MG capsule Take 1 capsule (20 mg total) by mouth daily. 90 capsule 3   phenylephrine -shark liver oil-mineral oil-petrolatum (PREPARATION H) 0.25-14-74.9 % rectal ointment Place 1 Application rectally 2 (two) times daily as needed for hemorrhoids. 56 g 0   Wheat Dextrin (BENEFIBER) POWD Use as directed, daily     No current facility-administered medications on file prior to visit.    Allergies  Allergen Reactions   Levofloxacin Itching      PE Today's Vitals   11/10/23 1433  BP: 118/76  Pulse: 66  Resp: 14  Weight: 197 lb (89.4 kg)  Height: 5\' 4"  (1.626 m)    Body mass index is 33.81 kg/m.  Physical Exam Vitals reviewed. Exam conducted with a chaperone present.  Constitutional:      General: She is not in acute distress.    Appearance: Normal appearance.  HENT:     Head: Normocephalic and atraumatic.     Nose: Nose normal.  Eyes:     Extraocular Movements: Extraocular movements intact.     Conjunctiva/sclera: Conjunctivae normal.  Cardiovascular:     Rate and Rhythm: Normal rate and regular rhythm.     Heart sounds: No murmur heard.    No friction rub. No gallop.  Pulmonary:     Effort: Pulmonary effort is normal. No respiratory distress.     Breath sounds: Normal breath sounds. No stridor. No wheezing, rhonchi or rales.  Genitourinary:    General: Normal vulva.     Exam position: Lithotomy position.     Vagina: Normal. No vaginal discharge.     Cervix: Normal. No cervical motion tenderness, discharge or lesion.     Uterus: Normal. Not enlarged and not tender.      Adnexa: Right adnexa normal and left adnexa normal.  Musculoskeletal:        General: Normal range of motion.     Cervical back: Normal range of motion.  Neurological:     General: No focal deficit present.     Mental Status: She is alert.  Psychiatric:        Mood and Affect: Mood normal.         Behavior: Behavior normal.     11/10/23 TVUS: Indications: Abnormal uterine bleeding  Findings:   Uterus: 13.7 x 9.5 x 7.6 cm, anteflexed uterus.  Multiple fibroids noted. Fibroids: 1-3.0 x 1.8 cm, subserosal 2-3.0  x 3.2 cm, submucosal 3-2.7 x 2.0 cm, subserosal 4-4.8 x 4.4 cm, intramural 5-3.9 x 3.4 cm, intramural Endometrial thickness: 5 mm.  Endometrium is fluid-filled.  A 2.1 x 1.3 cm vascular filling defect is noted. Left ovary: 2.1 x 1.4 x 1.3 cm, normal-appearing. Right ovary: 1.9 x 1.1 x 1.4 cm, normal-appearing. No free fluid.  Impression:  Multifibroid uterus.  The largest is 4.8 cm. Endometrial polyp measuring 2.1 cm.  Romaine Closs, MD  Procedure Consent was signed. Timeout was performed. Speculum inserted into the vagina, cervix visualized and was prepped with Betadine.  Cervical block was performed with 10cc 1% lidocaine  (Exp 02/2025). A single-toothed tenaculum was placed on the anterior lip of the cervix to stabilize it.  The 3 mm pipelle was introduced into the endometrial cavity without difficulty to a depth of 10cm, suction initiated and a moderate amount of tissue was obtained and sent to pathology.  The instruments were removed from the patient's vagina.  Minimal bleeding from the cervix was noted.  The patient tolerated the procedure well.     Assessment and Plan:        Abnormal uterine bleeding (AUB) Assessment & Plan: Hormonal work-up normal Reviewed TVUS revealing 14 cm multifibroid uterus with 3 cm submucosal fibroid and 2 cm endometrial polyp. EMB uncomplicated First line therapies reviewed, including hormonal therapy and tranexamic acid . Also discussed endometrial ablation, uterine artery embolization and hysterectomy. Bleeding is currently poorly controlled on 15 mg Aygestin  daily and patient needs improved bleeding control for work. Will transition to Myfembree  until ablation.  Side effects including menopausal symptoms, abnormal uterine  bleeding, mood changes reviewed with patient. Plan for Zofran  as needed.  Plan for hysteroscopic myomectomy, polypectomy, dilation and curettage, NovaSure endometrial ablation.  Patient has BTL for contraception.  Discussed outpatient procedure that provides management of abnormal uterine bleeding for approximately 5 years. Reviewed that  recovery is usually 1-2 days. Risks including infections, bleeding, and damage to surrounding organs reviewed. Recommend NPO prior to midnight and reviewed medication to take on day of surgery. Dicussed use of NSAIDS as needed for pain postoperatively.  Preop checklist: Antibiotics: none DVT ppx: SCDs Postop visit: 1 week Additional clearance: Medical for hypertension   Orders: -     Lidocaine  HCl (PF) -     Surgical pathology -     Ambulatory Referral For Surgery Scheduling -     Endometrial biopsy  Uterine leiomyoma, unspecified location Assessment & Plan: Reviewed ultrasound revealing: Multifibroid uterus with 3 cm submucosal fibroid and 2 cm endometrial polyp.   Symptomatic fibroids: AUB, dysmenorrhea, pelvic pain  Management as noted above Patient aware that hysteroscopic procedure will not treat intramural and subserosal fibroids. Patient would like to proceed in stepwise fashion given new job and probationary period. Will plan for hysteroscopic myomectomy and NovaSure ablation at this time.  Orders: -     Ambulatory Referral For Surgery Scheduling  Pre-procedure lab exam -     Pregnancy, urine  Cervical cancer screening -     Cytology - PAP  Other orders -     Myfembree ; Take 1 tablet by mouth daily.  Dispense: 28 tablet; Refill: 11     Romaine Closs, MD

## 2023-11-11 ENCOUNTER — Ambulatory Visit: Admitting: Family Medicine

## 2023-11-11 ENCOUNTER — Encounter: Payer: Self-pay | Admitting: Family Medicine

## 2023-11-11 VITALS — BP 128/82 | Ht 64.0 in | Wt 197.0 lb

## 2023-11-11 DIAGNOSIS — M25552 Pain in left hip: Secondary | ICD-10-CM | POA: Diagnosis not present

## 2023-11-11 DIAGNOSIS — M25551 Pain in right hip: Secondary | ICD-10-CM | POA: Diagnosis not present

## 2023-11-11 MED ORDER — DICLOFENAC SODIUM 75 MG PO TBEC: 75.0000 mg | DELAYED_RELEASE_TABLET | Freq: Two times a day (BID) | ORAL | 1 refills | Status: DC

## 2023-11-11 MED ORDER — KETOROLAC TROMETHAMINE 60 MG/2ML IM SOLN
60.0000 mg | Freq: Once | INTRAMUSCULAR | Status: AC
  Administered 2023-11-11: 60 mg via INTRAMUSCULAR

## 2023-11-11 MED ORDER — METHYLPREDNISOLONE ACETATE 40 MG/ML IJ SUSP
80.0000 mg | Freq: Once | INTRAMUSCULAR | Status: AC
  Administered 2023-11-11: 80 mg via INTRAMUSCULAR

## 2023-11-11 NOTE — Patient Instructions (Signed)
 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 Avoid painful activities as much as possible. Ice over area of pain 3-4 times a day for 15 minutes at a time Start physical therapy and do home exercises on days you don't go to therapy - this is very important or this will not get better. Stop meloxicam . Start diclofenac  75mg  twice a day with food for pain and inflammation. Follow up with me in 5-6 weeks.

## 2023-11-11 NOTE — Progress Notes (Signed)
 PCP: Dameron, Marisa, DO  Subjective:   HPI: Patient is a 43 y.o. female here for bilateral hip pain.  Patient reports bilateral lateral hip pain dating back to last year. No injury or trauma. Has had injections twice in each hip for greater trochanteric bursitis with relief for about 2 months each. Was given home exercises but felt these made the pain worse. Bothers sleeping on her side and can wake her up. No back pain. No numbness/tingling. No radiation of pain.  Past Medical History:  Diagnosis Date   Allergy    Anxiety    Arthralgia of left upper arm 08/07/2019   Bacterial vaginosis 06/18/2023   Cough 03/21/2016   no fever, states coughing up "snot colored cold stuff"-  has had this x 1.5 weeks and is getting much better- taking tylenol  cold   Depression    never been treated , "probably got some "   Encounter for medical examination to establish care 12/11/2016   Gall stones    GERD without esophagitis 05/16/2014   Headache(784.0)    Hemorrhoids    S/P cesarean section 11/03/2014   Screening examination for STD (sexually transmitted disease) 12/11/2016   Screening examination for STI 03/03/2023   Sebaceous cyst 05/29/2022   Skin mole 12/11/2016   Skin nodule 05/08/2022   Stress 02/08/2023   Tooth ache    right upper molar partially broke off   Trichomonas vaginitis 02/08/2023   Vaginal discharge 01/02/2021   Vitamin D  deficiency 11/13/2016   Wrist pain 01/02/2021    Current Outpatient Medications on File Prior to Visit  Medication Sig Dispense Refill   AMBULATORY NON FORMULARY MEDICATION Diltiazem gel with 2% lidocaine   Apply a pea sized amount into your rectum three times daily until healed Dispense 30 GM zero refill 30 g 1   amLODipine  (NORVASC ) 10 MG tablet Take 1 tablet (10 mg total) by mouth at bedtime. 90 tablet 3   Azelastine HCl 137 MCG/SPRAY SOLN Place into the nose.     fluticasone  (FLONASE ) 50 MCG/ACT nasal spray Place 1 spray into both nostrils  daily. 18.2 mL 2   omeprazole  (PRILOSEC) 20 MG capsule Take 1 capsule (20 mg total) by mouth daily. 90 capsule 3   ondansetron  (ZOFRAN ) 8 MG tablet Take 1 tablet (8 mg total) by mouth every 8 (eight) hours as needed for nausea. 20 tablet 0   phenylephrine -shark liver oil-mineral oil-petrolatum (PREPARATION H) 0.25-14-74.9 % rectal ointment Place 1 Application rectally 2 (two) times daily as needed for hemorrhoids. 56 g 0   Relugolix-Estradiol -Norethind (MYFEMBREE) 40-1-0.5 MG TABS Take 1 tablet by mouth daily. 28 tablet 11   Wheat Dextrin (BENEFIBER) POWD Use as directed, daily     Current Facility-Administered Medications on File Prior to Visit  Medication Dose Route Frequency Provider Last Rate Last Admin   lidocaine  (PF) (XYLOCAINE ) 1 % injection 10 mL  10 mL Infiltration Once         Past Surgical History:  Procedure Laterality Date   CESAREAN SECTION     CESAREAN SECTION WITH BILATERAL TUBAL LIGATION Bilateral 11/03/2014   Procedure: CESAREAN SECTION WITH BILATERAL TUBAL LIGATION;  Surgeon: Gabrielle Joiner, MD;  Location: [facility];  Service: Obstetrics;  Laterality: Bilateral;   CHOLECYSTECTOMY     CHOLECYSTECTOMY, LAPAROSCOPIC     DILATION AND CURETTAGE OF UTERUS     INCISIONAL HERNIA REPAIR N/A 03/27/2016   Procedure: LAPAROSCOPIC ASSISTED REPAIR OF INCARCERATED  INCISIONAL HERNIA;  Surgeon: Aldean Hummingbird, MD;  Location: [facility];  Service: General;  Laterality: N/A;   INSERTION OF MESH N/A 03/27/2016   Procedure: INSERTION OF MESH;  Surgeon: Aldean Hummingbird, MD;  Location: [facility];  Service: General;  Laterality: N/A;   THERAPEUTIC ABORTION     TUBAL LIGATION      Allergies  Allergen Reactions   Levofloxacin Itching    BP 128/82   Ht 5\' 4"  (1.626 m)   Wt 197 lb (89.4 kg)   LMP 10/15/2023   BMI 33.81 kg/m       No data to display              No data to display              Objective:  Physical Exam:  Gen: NAD, comfortable in exam room  Bilateral hips: No  deformity. Full range of motion with 5/5 strength except 4/5 right hip abduction and 5-/5 left hip abduction both reproducing her pain. Tenderness to palpation gluteus medius/minimus tendons, greater trochanters, proximal IT bands, and to lesser extent external rotators. Neurovascularly intact distally. Negative logroll Negative faber, fadir, and piriformis stretches.  Assessment & Plan:  1. Bilateral hip pain - consistent with combination of IT band syndrome, gluteus medius tendinopathy, greater trochanteric pain syndrome.  While she has arthritis on her MRIs, her exam indicates this is not symptomatic.  She's had trochanteric injections with temporary relief but hasn't really focused on the rehab which is very important for long term recovery for this condition.  Stressed this will take months to improve and to be adherent to rehab.  Start physical therapy, home exercise program focusing on hip abduction strengthening, IT band stretching especially.  Switch from meloxicam  to diclofenac .  OTCs and meloxicam  haven't helped her as much.  Icing as needed.  IM depomedrol and toradol  provided today as she was asking for some more immediate relief as well.  Follow up in 5-6 weeks.

## 2023-11-11 NOTE — Telephone Encounter (Signed)
 Medication refill request: zofran  8mg  Last OV:  11/10/23  Next AEX: not scheduled  Last MMG (if hormonal medication request): n/a Refill authorized: please advise

## 2023-11-12 ENCOUNTER — Encounter: Payer: Self-pay | Admitting: Obstetrics and Gynecology

## 2023-11-12 DIAGNOSIS — D259 Leiomyoma of uterus, unspecified: Secondary | ICD-10-CM | POA: Insufficient documentation

## 2023-11-12 LAB — CYTOLOGY - PAP
Adequacy: ABSENT
Comment: NEGATIVE
Diagnosis: NEGATIVE
High risk HPV: NEGATIVE

## 2023-11-12 LAB — SURGICAL PATHOLOGY

## 2023-11-12 NOTE — Assessment & Plan Note (Addendum)
 Reviewed ultrasound revealing: Multifibroid uterus with 3 cm submucosal fibroid and 2 cm endometrial polyp.   Symptomatic fibroids: AUB, dysmenorrhea, pelvic pain  Management as noted above Patient aware that hysteroscopic procedure will not treat intramural and subserosal fibroids. Patient would like to proceed in stepwise fashion given new job and probationary period. Will plan for hysteroscopic myomectomy and NovaSure ablation at this time.

## 2023-11-12 NOTE — Assessment & Plan Note (Addendum)
 Hormonal work-up normal Reviewed TVUS revealing 14 cm multifibroid uterus with 3 cm submucosal fibroid and 2 cm endometrial polyp. EMB uncomplicated First line therapies reviewed, including hormonal therapy and tranexamic acid . Also discussed endometrial ablation, uterine artery embolization and hysterectomy. Bleeding is currently poorly controlled on 15 mg Aygestin  daily and patient needs improved bleeding control for work. Will transition to Myfembree  until ablation.  Side effects including menopausal symptoms, abnormal uterine bleeding, mood changes reviewed with patient. Plan for Zofran  as needed.  Plan for hysteroscopic myomectomy, polypectomy, dilation and curettage, NovaSure endometrial ablation.  Patient has BTL for contraception.  Discussed outpatient procedure that provides management of abnormal uterine bleeding for approximately 5 years. Reviewed that  recovery is usually 1-2 days. Risks including infections, bleeding, and damage to surrounding organs reviewed. Recommend NPO prior to midnight and reviewed medication to take on day of surgery. Dicussed use of NSAIDS as needed for pain postoperatively.  Preop checklist: Antibiotics: none DVT ppx: SCDs Postop visit: 1 week Additional clearance: Medical for hypertension

## 2023-11-13 NOTE — Addendum Note (Signed)
 Addended by: Cleone Dad V on: 11/13/2023 11:01 AM   Modules accepted: Level of Service

## 2023-11-19 ENCOUNTER — Encounter: Payer: Self-pay | Admitting: Student

## 2023-11-25 ENCOUNTER — Encounter: Payer: Self-pay | Admitting: Obstetrics and Gynecology

## 2023-11-25 NOTE — Telephone Encounter (Signed)
 OV 11/10/23 Surgery referral pending medical clearance.   Dr. Andrena Ke -please review and advise.

## 2023-12-04 ENCOUNTER — Encounter: Payer: Self-pay | Admitting: Obstetrics and Gynecology

## 2023-12-04 ENCOUNTER — Ambulatory Visit: Admitting: Student

## 2023-12-04 ENCOUNTER — Ambulatory Visit (INDEPENDENT_AMBULATORY_CARE_PROVIDER_SITE_OTHER): Admitting: Student

## 2023-12-04 VITALS — BP 136/80 | HR 100 | Ht 64.0 in | Wt 193.4 lb

## 2023-12-04 DIAGNOSIS — N939 Abnormal uterine and vaginal bleeding, unspecified: Secondary | ICD-10-CM

## 2023-12-04 DIAGNOSIS — I10 Essential (primary) hypertension: Secondary | ICD-10-CM

## 2023-12-04 DIAGNOSIS — D500 Iron deficiency anemia secondary to blood loss (chronic): Secondary | ICD-10-CM | POA: Diagnosis not present

## 2023-12-04 DIAGNOSIS — R011 Cardiac murmur, unspecified: Secondary | ICD-10-CM | POA: Diagnosis not present

## 2023-12-04 LAB — POCT HEMOGLOBIN: Hemoglobin: 8.9 g/dL — AB (ref 11–14.6)

## 2023-12-04 MED ORDER — FERROUS SULFATE 325 (65 FE) MG PO TABS: 325.0000 mg | ORAL_TABLET | ORAL | 0 refills | Status: DC

## 2023-12-04 MED ORDER — AMLODIPINE BESYLATE 5 MG PO TABS: 5.0000 mg | ORAL_TABLET | Freq: Every day | ORAL | 3 refills | Status: DC

## 2023-12-04 NOTE — Assessment & Plan Note (Addendum)
 Hemoglobin has dropped from 12.7 to 8.9 since March.  Reassuringly she is asymptomatic however pulse is on higher end at 100 and there is a soft systolic murmur which is likely related to anemia. - Iron supplement ordered - Future BMP and CBC ordered for next week prior to surgery to assess kidney function and ensure stable hemoglobin - she has no medications that need to be held prior to procedure, >4 METs, no indication for cardiac work up prior to procedure -Will provide letter for optimization for GYN surgery and message her gynecologist

## 2023-12-04 NOTE — Progress Notes (Cosign Needed Addendum)
    SUBJECTIVE:   CHIEF COMPLAINT / HPI:   The patient is an 43 year old with fibroids and hypertension who presents for preoperative clearance for a GYN procedure.  She is experiencing abnormal uterine bleeding with heavy and prolonged periods. Aygestin  provided limited relief, while Myfembree  has lightened the bleeding but it remains prolonged. She has experienced vaginal bleeding since April 3rd, with only a brief three-day cessation.   She is not taking an iron supplement but has had two rounds of iron infusions in the past. Her hemoglobin was 12.7 in March and has dropped to 8.9. She feels tired but denies lightheadedness or dizziness. 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  She has had anesthesia in the past without complications and has no history of blood clots.   She manages hypertension with amlodipine  10 mg as needed, approximately once a week, based on home blood pressure monitoring. Blood pressure readings are mostly normal with occasional elevations. She last took amlodipine  two days ago.   PERTINENT  PMH / PSH: AUB, fibroids, HTN  OBJECTIVE:   BP 136/80   Pulse 100   Ht 5\' 4"  (1.626 m)   Wt 193 lb 6.4 oz (87.7 kg)   LMP 10/15/2023   SpO2 98%   BMI 33.20 kg/m    General: NAD, pleasant, well-appearing Cardiac: RRR, soft systolic murmur Respiratory: CTAB, normal effort, No wheezes, rales or rhonchi Skin: warm and dry Neuro: alert, no obvious focal deficits Psych: Normal affect and mood  ASSESSMENT/PLAN:   Abnormal uterine bleeding (AUB) Hemoglobin has dropped from 12.7 to 8.9 since March.  Reassuringly she is asymptomatic however pulse is on higher end at 100 and there is a soft systolic murmur which is likely related to anemia. - Iron supplement ordered - Future BMP and CBC ordered for next week prior to surgery to assess kidney  function and ensure stable hemoglobin - she has no medications that need to be held prior to procedure, >4 METs, no indication for cardiac work up prior to procedure -Will provide letter for optimization for GYN surgery and message her gynecologist   Hypertension Discussed that amlodipine  is not typically used as an as needed medication.  Reassuringly, blood pressure is less than 140/90 and she has not taken amlodipine  in 2 days.  Reports blood pressure is mostly well-controlled at home.  - Reduce amlodipine  to 5 mg daily.  - Monitor blood pressure at home and bring readings to next appointment within the next month  - Consider ambulatory blood pressure monitoring if needed.   Murmur, cardiac Soft systolic murmur most likely related to anemia.  She is asymptomatic with no lightheadedness or shortness of breath or swelling.  Expect murmur to improve once anemia improves.  Will continue to monitor.     Dr. Glenn Lange, DO [place] [facility]

## 2023-12-04 NOTE — Assessment & Plan Note (Signed)
 Discussed that amlodipine  is not typically used as an as needed medication.  Reassuringly, blood pressure is less than 140/90 and she has not taken amlodipine  in 2 days.  Reports blood pressure is mostly well-controlled at home.  - Reduce amlodipine  to 5 mg daily.  - Monitor blood pressure at home and bring readings to next appointment within the next month  - Consider ambulatory blood pressure monitoring if needed.

## 2023-12-04 NOTE — Patient Instructions (Addendum)
 It was great to see you! Thank you for allowing me to participate in your care!  I recommend that you always bring your medications to each appointment as this makes it easy to ensure you are on the correct medications and helps us  not miss when refills are needed.  Our plans for today:  - take amlodipine  5 mg daily and iron supplement every other day  - can increase miralax  to twice daily for constipation if needed - return for BP check when able (next month or so)  - Return next week for lab only visit to check kidney function  - You are cleared for GYN procedure    Take care and seek immediate care sooner if you develop any concerns.   Dr. Glenn Lange, DO [facility]

## 2023-12-04 NOTE — Assessment & Plan Note (Signed)
 Soft systolic murmur most likely related to anemia.  She is asymptomatic with no lightheadedness or shortness of breath or swelling.  Expect murmur to improve once anemia improves.  Will continue to monitor.

## 2023-12-05 NOTE — Addendum Note (Signed)
 Addended by: Sidonie Drape on: 12/05/2023 06:35 AM   Modules accepted: Orders

## 2023-12-08 ENCOUNTER — Encounter: Payer: Self-pay | Admitting: Obstetrics and Gynecology

## 2023-12-08 ENCOUNTER — Other Ambulatory Visit

## 2023-12-08 DIAGNOSIS — I10 Essential (primary) hypertension: Secondary | ICD-10-CM

## 2023-12-08 DIAGNOSIS — D500 Iron deficiency anemia secondary to blood loss (chronic): Secondary | ICD-10-CM

## 2023-12-09 ENCOUNTER — Ambulatory Visit (HOSPITAL_COMMUNITY): Payer: Self-pay | Admitting: Student

## 2023-12-09 LAB — BASIC METABOLIC PANEL WITH GFR
BUN/Creatinine Ratio: 26 — ABNORMAL HIGH (ref 9–23)
BUN: 17 mg/dL (ref 6–24)
CO2: 23 mmol/L (ref 20–29)
Calcium: 9.2 mg/dL (ref 8.7–10.2)
Chloride: 104 mmol/L (ref 96–106)
Creatinine, Ser: 0.66 mg/dL (ref 0.57–1.00)
Glucose: 93 mg/dL (ref 70–99)
Potassium: 4.4 mmol/L (ref 3.5–5.2)
Sodium: 143 mmol/L (ref 134–144)
eGFR: 111 mL/min/{1.73_m2} (ref >59)

## 2023-12-09 LAB — CBC
Hematocrit: 29.8 % — ABNORMAL LOW (ref 34.0–46.6)
Hemoglobin: 9.4 g/dL — ABNORMAL LOW (ref 11.1–15.9)
MCH: 29.3 pg (ref 26.6–33.0)
MCHC: 31.5 g/dL (ref 31.5–35.7)
MCV: 93 fL (ref 79–97)
Platelets: 250 10*3/uL (ref 150–450)
RBC: 3.21 x10E6/uL — ABNORMAL LOW (ref 3.77–5.28)
RDW: 13.2 % (ref 11.7–15.4)
WBC: 7.3 10*3/uL (ref 3.4–10.8)

## 2023-12-10 ENCOUNTER — Other Ambulatory Visit: Payer: Self-pay | Admitting: Student

## 2023-12-11 NOTE — Telephone Encounter (Signed)
 See surgery referral. Planning surgery for 12/18/23.   Encounter closed.

## 2023-12-11 NOTE — Telephone Encounter (Signed)
See surgery referral

## 2023-12-13 ENCOUNTER — Encounter: Payer: Self-pay | Admitting: Obstetrics and Gynecology

## 2023-12-14 ENCOUNTER — Encounter (HOSPITAL_COMMUNITY): Payer: Self-pay | Admitting: Obstetrics and Gynecology

## 2023-12-15 ENCOUNTER — Other Ambulatory Visit: Payer: Self-pay | Admitting: Obstetrics and Gynecology

## 2023-12-15 ENCOUNTER — Encounter (HOSPITAL_COMMUNITY): Payer: Self-pay | Admitting: Obstetrics and Gynecology

## 2023-12-15 ENCOUNTER — Other Ambulatory Visit: Payer: Self-pay | Admitting: Student

## 2023-12-15 NOTE — Telephone Encounter (Signed)
 Medication refill request: zofran  8mg  Last OV:  11-10-23 surgery: 12-18-23 hysteroscopy with myosure Last MMG (if hormonal medication request): n/a Refill authorized: please approve or deny as appropriate

## 2023-12-15 NOTE — Progress Notes (Signed)
 Spoke w/ via phone for pre-op interview--- pt Lab needs dos----   urine preg, istat , ekg      Lab results------ no COVID test -----patient states asymptomatic no test needed Arrive at -------  0845 on 12-18-2023 NPO after MN w/ exception sips of water w/ meds Pre-Surgery Ensure or G2:  n/a  Med rec completed Medications to take morning of surgery ----- prilosec, myfembree  Diabetic medication ----- n/a  GLP1 agonist last dose: n/a GLP1 instructions:  Patient instructed no nail polish to be worn day of surgery Patient instructed to bring photo id and insurance card day of surgery Patient aware to have Driver (ride ) / caregiver    for 24 hours after surgery - father, norman  [place] Patient Special Instructions ----- shower w/ dial/ antibacterial soap morning of surgery Pre-Op special Instructions ----- sent inbox message in epic to Dr Andrena Ke on 12-14-2023, requested pre-op orders  Patient verbalized understanding of instructions that were given at this phone interview. Patient denies chest pain, sob, fever, cough at the interview.

## 2023-12-16 ENCOUNTER — Encounter: Payer: Self-pay | Admitting: Obstetrics and Gynecology

## 2023-12-16 ENCOUNTER — Ambulatory Visit (INDEPENDENT_AMBULATORY_CARE_PROVIDER_SITE_OTHER): Admitting: Obstetrics and Gynecology

## 2023-12-16 VITALS — BP 122/78 | HR 87 | Wt 191.2 lb

## 2023-12-16 DIAGNOSIS — D259 Leiomyoma of uterus, unspecified: Secondary | ICD-10-CM

## 2023-12-16 DIAGNOSIS — N939 Abnormal uterine and vaginal bleeding, unspecified: Secondary | ICD-10-CM | POA: Diagnosis not present

## 2023-12-16 DIAGNOSIS — Z01818 Encounter for other preprocedural examination: Secondary | ICD-10-CM | POA: Diagnosis not present

## 2023-12-16 MED ORDER — IBUPROFEN 800 MG PO TABS
800.0000 mg | ORAL_TABLET | Freq: Three times a day (TID) | ORAL | 0 refills | Status: DC | PRN
Start: 2023-12-16 — End: 2024-01-19

## 2023-12-16 NOTE — Progress Notes (Signed)
 43 y.o. [id number] female status post BTL, 2016, IDA (s/p IV Fe with GI), AUB, HTN here for preop exam for Hysterocopy with myomectomy, endometrial ablation. 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  No LMP recorded. (Menstrual status: Irregular Periods).   At 09/23/23 appt, she noted: menstrual cycles last about two weeks.  Heavy bleeding and long cycles, progressing over the past couple of years. Back pain. Bleeding through clothing, using super tampons q30min. Goals- wants a regular period. Tried POP x2 months last year, stopped because no improvement in sx. Hgb- 9.8 No recent Gyn care 2/2 lack of insurance 3/27 hemorrhoid procedure scheduled  She was started on aygestin , which was titrated over 1 month from 5 mg daily to 15 mg daily, without improvement in her bleeding.  She was then started on lysteda  on 10/23/2023. She restarted aygestin  TID after that. She has not had bleeding since. Patient having severe premenstrual nausea and dysmenorrhea.  Affecting work and this is a new job for her.  She is a single parent and still on probation. Wants to feel better. Unable to take the time for hysterectomy at this time. No CP or SOB. No VB today on myfembree .  09/23/2023 FSH, LH, prolactin, TSH, UPT normal 10/08/2023 hemoglobin 12.7, improved from 9.7 on 07/21/2023  11/10/23 TVUS: Uterus: 13.7 x 9.5 x 7.6 cm, anteflexed uterus.  Multiple fibroids noted. Fibroids: 1-3.0 x 1.8 cm, subserosal 2-3.0 x 3.2 cm, submucosal   3-2.7 x 2.0 cm, subserosal 4-4.8 x 4.4 cm, intramural 5-3.9 x 3.4 cm, intramural Endometrial thickness: 5 mm.  Endometrium is fluid-filled.  A 2.1 x 1.3 cm vascular filling defect is noted. Left ovary: 2.1 x 1.4 x 1.3 cm, normal-appearing. Right ovary: 1.9 x 1.1 x 1.4 cm, normal-appearing. No free fluid.  Impression:  Multifibroid uterus.  The largest is 4.8 cm. Endometrial polyp measuring 2.1 cm    Component Value Date/Time   DIAGPAP  11/10/2023 1524    - Negative  for intraepithelial lesion or malignancy (NILM)   DIAGPAP  01/02/2021 1000    - Negative for intraepithelial lesion or malignancy (NILM)   DIAGPAP  12/11/2016 0000    NEGATIVE FOR INTRAEPITHELIAL LESIONS OR MALIGNANCY.   DIAGPAP TRICHOMONAS VAGINALIS PRESENT. 12/11/2016 0000   HPVHIGH Negative 11/10/2023 1524   HPVHIGH Negative 01/02/2021 1000   ADEQPAP  11/10/2023 1524    Satisfactory for evaluation; transformation zone component ABSENT.   ADEQPAP  01/02/2021 1000    Satisfactory for evaluation; transformation zone component ABSENT.   ADEQPAP  12/11/2016 0000    Satisfactory for evaluation  endocervical/transformation zone component ABSENT.  11/10/23 pathology: A. ENDOMETRIUM, BIOPSY:  Benign endometrium exhibiting mixed hormone effect  Fibrin thrombi present compatible bleeding  Negative for polyp, atypia, hyperplasia and carcinoma   GYN HISTORY: Prior CD  OB History  Gravida Para Term Preterm AB Living  7 3 3  0 4 3  SAB IAB Ectopic Multiple Live Births  1 3 0 0 3    # Outcome Date GA Lbr Len/2nd Weight Sex Type Anes PTL Lv  7 Term 11/03/14 [redacted]w[redacted]d  7 lb 15 oz (3.6 kg) F CS-LTranv Spinal  LIV  6 IAB           5 IAB           4 IAB           3 SAB           2 Term     F CS-LTranv  N  Birth Comments: repeat; AICU after del for BP  1 Term    10 lb 13 oz (4.905 kg) M CS-LTranv  N LIV    Past Medical History:  Diagnosis Date   Abnormal uterine bleeding (AUB)    Anal fissure    GI--- dr General Kenner   Anxiety    Depression    GERD (gastroesophageal reflux disease)    EGD 03-30-2023 w/ gastritis   Heart murmur    Hemorrhoids    s/p  hemorrhoid banding ligation 10-08-2023 by dr General Kenner   History of trichomonal vaginitis 01/2023   Hypertension    Iron deficiency anemia due to chronic blood loss    treated w/ iron infusions;   01/ 2025 x2;   08/ 2024 x2   Seasonal allergic rhinitis    Uterine leiomyoma    Vitamin D  deficiency 11/13/2016    Past Surgical History:   Procedure Laterality Date   CESAREAN SECTION  04/22/2000   @[facility]  by dr b. Colvin Dec   CESAREAN SECTION WITH BILATERAL TUBAL LIGATION Bilateral 11/03/2014   Procedure: CESAREAN SECTION WITH BILATERAL TUBAL LIGATION;  Surgeon: Gabrielle Joiner, MD;  Location: [facility];  Service: Obstetrics;  Laterality: Bilateral;   CHOLECYSTECTOMY, LAPAROSCOPIC  01/26/2001   @[facility]   by dr d. blackman   COLONOSCOPY WITH ESOPHAGOGASTRODUODENOSCOPY (EGD)  03/30/2023   dr Ambrosio Junker HERNIA REPAIR N/A 03/27/2016   Procedure: LAPAROSCOPIC ASSISTED REPAIR OF INCARCERATED  INCISIONAL HERNIA;  Surgeon: Aldean Hummingbird, MD;  Location: [facility];  Service: General;  Laterality: N/A;   INSERTION OF MESH N/A 03/27/2016   Procedure: INSERTION OF MESH;  Surgeon: Aldean Hummingbird, MD;  Location: [facility];  Service: General;  Laterality: N/A;   REPEAT CESAREAN SECTION  07/10/2008   @[facility]   by dr c. April Knack    Current Outpatient Medications on File Prior to Visit  Medication Sig Dispense Refill   AMBULATORY NON FORMULARY MEDICATION Diltiazem gel with 2% lidocaine   Apply a pea sized amount into your rectum three times daily until healed Dispense 30 GM zero refill (Patient taking differently: Place rectally as directed. Diltiazem gel with 2% lidocaine   Apply a pea sized amount into your rectum three times daily until healed Dispense 30 GM zero refill) 30 g 1   amLODipine  (NORVASC ) 5 MG tablet Take 1 tablet (5 mg total) by mouth at bedtime. (Patient taking differently: Take 5 mg by mouth at bedtime.) 90 tablet 3   Azelastine HCl 137 MCG/SPRAY SOLN Place 2 sprays into the nose daily as needed.     ferrous sulfate  325 (65 FE) MG tablet Take 1 tablet (325 mg total) by mouth every other day. (Patient taking differently: Take 325 mg by mouth every other day.) 45 tablet 0   omeprazole  (PRILOSEC) 20 MG capsule Take 1 capsule (20 mg total) by mouth daily. (Patient taking differently: Take 20 mg by mouth daily.) 90 capsule 3   ondansetron  (ZOFRAN ) 8  MG tablet TAKE 1 TABLET BY MOUTH EVERY 8 HOURS AS NEEDED FOR NAUSEA 30 tablet 0   Relugolix-Estradiol -Norethind (MYFEMBREE ) 40-1-0.5 MG TABS Take 1 tablet by mouth daily. (Patient taking differently: Take 1 tablet by mouth daily.) 28 tablet 11   Wheat Dextrin (BENEFIBER) POWD Use as directed, daily     No current facility-administered medications on file prior to visit.    Allergies  Allergen Reactions   Levofloxacin Itching      PE Today's Vitals   12/16/23 0906  BP: 122/78  Pulse: 87  SpO2:  98%  Weight: 191 lb 3.2 oz (86.7 kg)    Body mass index is 32.82 kg/m.  Physical Exam Vitals reviewed.  Constitutional:      General: She is not in acute distress.    Appearance: Normal appearance.  HENT:     Head: Normocephalic and atraumatic.     Nose: Nose normal.  Eyes:     Extraocular Movements: Extraocular movements intact.     Conjunctiva/sclera: Conjunctivae normal.  Cardiovascular:     Rate and Rhythm: Normal rate and regular rhythm.     Heart sounds: No murmur heard.    No friction rub. No gallop.  Pulmonary:     Effort: Pulmonary effort is normal. No respiratory distress.     Breath sounds: Normal breath sounds. No stridor. No wheezing, rhonchi or rales.  Musculoskeletal:        General: Normal range of motion.     Cervical back: Normal range of motion.  Neurological:     General: No focal deficit present.     Mental Status: She is alert.  Psychiatric:        Mood and Affect: Mood normal.        Behavior: Behavior normal.      Assessment and Plan:        Preop examination -     Ibuprofen ; Take 1 tablet (800 mg total) by mouth every 8 (eight) hours as needed for up to 42 doses.  Dispense: 42 tablet; Refill: 0  Abnormal uterine bleeding (AUB) Assessment & Plan: Hormonal work-up normal Reviewed TVUS revealing 14 cm multifibroid uterus with 3 cm submucosal fibroid and 2 cm endometrial polyp. EMB uncomplicated Failed Aygestin  and lysteda .  Bleeding improved  on Myfembree . Plan for Zofran  as needed for nausea.  Plan for hysteroscopic myomectomy, polypectomy, dilation and curettage, NovaSure endometrial ablation.  Patient has BTL for contraception. Procedure reviewed.  Patient was under the impression that all fibroids would be removed with this procedure.  Discussed that this procedure will only address submucosal fibroids.  She would need to undergo a myomectomy or hysterectomy in order to remove her other fibroids.  However she does not have medical leave at her new job for this kind of procedure. Also reviewed [place]. After discussion, she would like to proceed as scheduled.  Reviewed the it is not uncommon to proceed in a stepwise fashion for the treatment of AUB F, utilizing minor procedures until patients can undergo major procedures if later needed.  Discussed outpatient procedure that provides management of abnormal uterine bleeding for approximately 5 years. Reviewed that  recovery is usually 1-2 days. Risks including infections, bleeding, and damage to surrounding organs reviewed. Recommend NPO prior to midnight and reviewed medication to take on day of surgery. Dicussed use of NSAIDS as needed for pain postoperatively.  Preop checklist: Antibiotics: none DVT ppx: SCDs Postop visit: 1 week Additional clearance: Medically cleared 12/03/25, BMP wnl, CBC notable fot Hgb 9.4    Uterine leiomyoma, unspecified location   As noted   Romaine Closs, MD

## 2023-12-16 NOTE — Assessment & Plan Note (Addendum)
 Hormonal work-up normal Reviewed TVUS revealing 14 cm multifibroid uterus with 3 cm submucosal fibroid and 2 cm endometrial polyp. EMB uncomplicated Failed Aygestin  and lysteda .  Bleeding improved on Myfembree . Plan for Zofran  as needed for nausea.  Plan for hysteroscopic myomectomy, polypectomy, dilation and curettage, NovaSure endometrial ablation.  Patient has BTL for contraception. Procedure reviewed.  Patient was under the impression that all fibroids would be removed with this procedure.  Discussed that this procedure will only address submucosal fibroids.  She would need to undergo a myomectomy or hysterectomy in order to remove her other fibroids.  However she does not have medical leave at her new job for this kind of procedure. Also reviewed [place]. After discussion, she would like to proceed as scheduled.  Reviewed the it is not uncommon to proceed in a stepwise fashion for the treatment of AUB F, utilizing minor procedures until patients can undergo major procedures if later needed.  Discussed outpatient procedure that provides management of abnormal uterine bleeding for approximately 5 years. Reviewed that  recovery is usually 1-2 days. Risks including infections, bleeding, and damage to surrounding organs reviewed. Recommend NPO prior to midnight and reviewed medication to take on day of surgery. Dicussed use of NSAIDS as needed for pain postoperatively.  Preop checklist: Antibiotics: none DVT ppx: SCDs Postop visit: 1 week Additional clearance: Medically cleared 12/03/25, BMP wnl, CBC notable fot Hgb 9.4

## 2023-12-16 NOTE — H&P (View-Only) (Signed)
 43 y.o. [id number] female status post BTL, 2016, IDA (s/p IV Fe with GI), AUB, HTN here for preop exam for Hysterocopy with myomectomy, endometrial ablation. 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  No LMP recorded. (Menstrual status: Irregular Periods).   At 09/23/23 appt, she noted: menstrual cycles last about two weeks.  Heavy bleeding and long cycles, progressing over the past couple of years. Back pain. Bleeding through clothing, using super tampons q30min. Goals- wants a regular period. Tried POP x2 months last year, stopped because no improvement in sx. Hgb- 9.8 No recent Gyn care 2/2 lack of insurance 3/27 hemorrhoid procedure scheduled  She was started on aygestin , which was titrated over 1 month from 5 mg daily to 15 mg daily, without improvement in her bleeding.  She was then started on lysteda  on 10/23/2023. She restarted aygestin  TID after that. She has not had bleeding since. Patient having severe premenstrual nausea and dysmenorrhea.  Affecting work and this is a new job for her.  She is a single parent and still on probation. Wants to feel better. Unable to take the time for hysterectomy at this time. No CP or SOB. No VB today on myfembree .  09/23/2023 FSH, LH, prolactin, TSH, UPT normal 10/08/2023 hemoglobin 12.7, improved from 9.7 on 07/21/2023  11/10/23 TVUS: Uterus: 13.7 x 9.5 x 7.6 cm, anteflexed uterus.  Multiple fibroids noted. Fibroids: 1-3.0 x 1.8 cm, subserosal 2-3.0 x 3.2 cm, submucosal   3-2.7 x 2.0 cm, subserosal 4-4.8 x 4.4 cm, intramural 5-3.9 x 3.4 cm, intramural Endometrial thickness: 5 mm.  Endometrium is fluid-filled.  A 2.1 x 1.3 cm vascular filling defect is noted. Left ovary: 2.1 x 1.4 x 1.3 cm, normal-appearing. Right ovary: 1.9 x 1.1 x 1.4 cm, normal-appearing. No free fluid.  Impression:  Multifibroid uterus.  The largest is 4.8 cm. Endometrial polyp measuring 2.1 cm    Component Value Date/Time   DIAGPAP  11/10/2023 1524    - Negative  for intraepithelial lesion or malignancy (NILM)   DIAGPAP  01/02/2021 1000    - Negative for intraepithelial lesion or malignancy (NILM)   DIAGPAP  12/11/2016 0000    NEGATIVE FOR INTRAEPITHELIAL LESIONS OR MALIGNANCY.   DIAGPAP TRICHOMONAS VAGINALIS PRESENT. 12/11/2016 0000   HPVHIGH Negative 11/10/2023 1524   HPVHIGH Negative 01/02/2021 1000   ADEQPAP  11/10/2023 1524    Satisfactory for evaluation; transformation zone component ABSENT.   ADEQPAP  01/02/2021 1000    Satisfactory for evaluation; transformation zone component ABSENT.   ADEQPAP  12/11/2016 0000    Satisfactory for evaluation  endocervical/transformation zone component ABSENT.  11/10/23 pathology: A. ENDOMETRIUM, BIOPSY:  Benign endometrium exhibiting mixed hormone effect  Fibrin thrombi present compatible bleeding  Negative for polyp, atypia, hyperplasia and carcinoma   GYN HISTORY: Prior CD  OB History  Gravida Para Term Preterm AB Living  7 3 3  0 4 3  SAB IAB Ectopic Multiple Live Births  1 3 0 0 3    # Outcome Date GA Lbr Len/2nd Weight Sex Type Anes PTL Lv  7 Term 11/03/14 [redacted]w[redacted]d  7 lb 15 oz (3.6 kg) F CS-LTranv Spinal  LIV  6 IAB           5 IAB           4 IAB           3 SAB           2 Term     F CS-LTranv  N  Birth Comments: repeat; AICU after del for BP  1 Term    10 lb 13 oz (4.905 kg) M CS-LTranv  N LIV    Past Medical History:  Diagnosis Date   Abnormal uterine bleeding (AUB)    Anal fissure    GI--- dr General Kenner   Anxiety    Depression    GERD (gastroesophageal reflux disease)    EGD 03-30-2023 w/ gastritis   Heart murmur    Hemorrhoids    s/p  hemorrhoid banding ligation 10-08-2023 by dr General Kenner   History of trichomonal vaginitis 01/2023   Hypertension    Iron deficiency anemia due to chronic blood loss    treated w/ iron infusions;   01/ 2025 x2;   08/ 2024 x2   Seasonal allergic rhinitis    Uterine leiomyoma    Vitamin D  deficiency 11/13/2016    Past Surgical History:   Procedure Laterality Date   CESAREAN SECTION  04/22/2000   @[facility]  by dr b. Colvin Dec   CESAREAN SECTION WITH BILATERAL TUBAL LIGATION Bilateral 11/03/2014   Procedure: CESAREAN SECTION WITH BILATERAL TUBAL LIGATION;  Surgeon: Gabrielle Joiner, MD;  Location: [facility];  Service: Obstetrics;  Laterality: Bilateral;   CHOLECYSTECTOMY, LAPAROSCOPIC  01/26/2001   @[facility]   by dr d. blackman   COLONOSCOPY WITH ESOPHAGOGASTRODUODENOSCOPY (EGD)  03/30/2023   dr Ambrosio Junker HERNIA REPAIR N/A 03/27/2016   Procedure: LAPAROSCOPIC ASSISTED REPAIR OF INCARCERATED  INCISIONAL HERNIA;  Surgeon: Aldean Hummingbird, MD;  Location: [facility];  Service: General;  Laterality: N/A;   INSERTION OF MESH N/A 03/27/2016   Procedure: INSERTION OF MESH;  Surgeon: Aldean Hummingbird, MD;  Location: [facility];  Service: General;  Laterality: N/A;   REPEAT CESAREAN SECTION  07/10/2008   @[facility]   by dr c. April Knack    Current Outpatient Medications on File Prior to Visit  Medication Sig Dispense Refill   AMBULATORY NON FORMULARY MEDICATION Diltiazem gel with 2% lidocaine   Apply a pea sized amount into your rectum three times daily until healed Dispense 30 GM zero refill (Patient taking differently: Place rectally as directed. Diltiazem gel with 2% lidocaine   Apply a pea sized amount into your rectum three times daily until healed Dispense 30 GM zero refill) 30 g 1   amLODipine  (NORVASC ) 5 MG tablet Take 1 tablet (5 mg total) by mouth at bedtime. (Patient taking differently: Take 5 mg by mouth at bedtime.) 90 tablet 3   Azelastine HCl 137 MCG/SPRAY SOLN Place 2 sprays into the nose daily as needed.     ferrous sulfate  325 (65 FE) MG tablet Take 1 tablet (325 mg total) by mouth every other day. (Patient taking differently: Take 325 mg by mouth every other day.) 45 tablet 0   omeprazole  (PRILOSEC) 20 MG capsule Take 1 capsule (20 mg total) by mouth daily. (Patient taking differently: Take 20 mg by mouth daily.) 90 capsule 3   ondansetron  (ZOFRAN ) 8  MG tablet TAKE 1 TABLET BY MOUTH EVERY 8 HOURS AS NEEDED FOR NAUSEA 30 tablet 0   Relugolix-Estradiol -Norethind (MYFEMBREE ) 40-1-0.5 MG TABS Take 1 tablet by mouth daily. (Patient taking differently: Take 1 tablet by mouth daily.) 28 tablet 11   Wheat Dextrin (BENEFIBER) POWD Use as directed, daily     No current facility-administered medications on file prior to visit.    Allergies  Allergen Reactions   Levofloxacin Itching      PE Today's Vitals   12/16/23 0906  BP: 122/78  Pulse: 87  SpO2:  98%  Weight: 191 lb 3.2 oz (86.7 kg)    Body mass index is 32.82 kg/m.  Physical Exam Vitals reviewed.  Constitutional:      General: She is not in acute distress.    Appearance: Normal appearance.  HENT:     Head: Normocephalic and atraumatic.     Nose: Nose normal.  Eyes:     Extraocular Movements: Extraocular movements intact.     Conjunctiva/sclera: Conjunctivae normal.  Cardiovascular:     Rate and Rhythm: Normal rate and regular rhythm.     Heart sounds: No murmur heard.    No friction rub. No gallop.  Pulmonary:     Effort: Pulmonary effort is normal. No respiratory distress.     Breath sounds: Normal breath sounds. No stridor. No wheezing, rhonchi or rales.  Musculoskeletal:        General: Normal range of motion.     Cervical back: Normal range of motion.  Neurological:     General: No focal deficit present.     Mental Status: She is alert.  Psychiatric:        Mood and Affect: Mood normal.        Behavior: Behavior normal.      Assessment and Plan:        Preop examination -     Ibuprofen ; Take 1 tablet (800 mg total) by mouth every 8 (eight) hours as needed for up to 42 doses.  Dispense: 42 tablet; Refill: 0  Abnormal uterine bleeding (AUB) Assessment & Plan: Hormonal work-up normal Reviewed TVUS revealing 14 cm multifibroid uterus with 3 cm submucosal fibroid and 2 cm endometrial polyp. EMB uncomplicated Failed Aygestin  and lysteda .  Bleeding improved  on Myfembree . Plan for Zofran  as needed for nausea.  Plan for hysteroscopic myomectomy, polypectomy, dilation and curettage, NovaSure endometrial ablation.  Patient has BTL for contraception. Procedure reviewed.  Patient was under the impression that all fibroids would be removed with this procedure.  Discussed that this procedure will only address submucosal fibroids.  She would need to undergo a myomectomy or hysterectomy in order to remove her other fibroids.  However she does not have medical leave at her new job for this kind of procedure. Also reviewed [place]. After discussion, she would like to proceed as scheduled.  Reviewed the it is not uncommon to proceed in a stepwise fashion for the treatment of AUB F, utilizing minor procedures until patients can undergo major procedures if later needed.  Discussed outpatient procedure that provides management of abnormal uterine bleeding for approximately 5 years. Reviewed that  recovery is usually 1-2 days. Risks including infections, bleeding, and damage to surrounding organs reviewed. Recommend NPO prior to midnight and reviewed medication to take on day of surgery. Dicussed use of NSAIDS as needed for pain postoperatively.  Preop checklist: Antibiotics: none DVT ppx: SCDs Postop visit: 1 week Additional clearance: Medically cleared 12/03/25, BMP wnl, CBC notable fot Hgb 9.4    Uterine leiomyoma, unspecified location   As noted   Romaine Closs, MD

## 2023-12-18 ENCOUNTER — Encounter (HOSPITAL_COMMUNITY): Payer: Self-pay | Admitting: Obstetrics and Gynecology

## 2023-12-18 ENCOUNTER — Ambulatory Visit (HOSPITAL_COMMUNITY): Admitting: Anesthesiology

## 2023-12-18 ENCOUNTER — Ambulatory Visit (HOSPITAL_COMMUNITY)
Admission: RE | Admit: 2023-12-18 | Discharge: 2023-12-18 | Disposition: A | Discharge status: Home / Self Care | Attending: Obstetrics and Gynecology | Admitting: Obstetrics and Gynecology

## 2023-12-18 ENCOUNTER — Encounter: Payer: Self-pay | Admitting: Obstetrics and Gynecology

## 2023-12-18 ENCOUNTER — Encounter (HOSPITAL_COMMUNITY)
Admission: RE | Disposition: A | Discharge status: Home / Self Care | Payer: Self-pay | Source: Home / Self Care | Attending: Obstetrics and Gynecology

## 2023-12-18 DIAGNOSIS — D259 Leiomyoma of uterus, unspecified: Secondary | ICD-10-CM

## 2023-12-18 DIAGNOSIS — N840 Polyp of corpus uteri: Secondary | ICD-10-CM | POA: Insufficient documentation

## 2023-12-18 DIAGNOSIS — D250 Submucous leiomyoma of uterus: Secondary | ICD-10-CM | POA: Insufficient documentation

## 2023-12-18 DIAGNOSIS — D251 Intramural leiomyoma of uterus: Secondary | ICD-10-CM | POA: Diagnosis not present

## 2023-12-18 DIAGNOSIS — D252 Subserosal leiomyoma of uterus: Secondary | ICD-10-CM | POA: Diagnosis not present

## 2023-12-18 DIAGNOSIS — I10 Essential (primary) hypertension: Secondary | ICD-10-CM | POA: Diagnosis not present

## 2023-12-18 DIAGNOSIS — N939 Abnormal uterine and vaginal bleeding, unspecified: Secondary | ICD-10-CM | POA: Diagnosis not present

## 2023-12-18 DIAGNOSIS — Z01818 Encounter for other preprocedural examination: Secondary | ICD-10-CM

## 2023-12-18 HISTORY — DX: Other seasonal allergic rhinitis: J30.2

## 2023-12-18 HISTORY — DX: Anal fissure, unspecified: K60.2

## 2023-12-18 HISTORY — PX: HYSTEROSCOPY WITH MYOMECTOMY: SHX7591

## 2023-12-18 HISTORY — DX: Gastro-esophageal reflux disease without esophagitis: K21.9

## 2023-12-18 HISTORY — DX: Essential (primary) hypertension: I10

## 2023-12-18 HISTORY — DX: Leiomyoma of uterus, unspecified: D25.9

## 2023-12-18 HISTORY — PX: HYSTEROSCOPY: SHX211

## 2023-12-18 HISTORY — DX: Cardiac murmur, unspecified: R01.1

## 2023-12-18 HISTORY — DX: Iron deficiency anemia secondary to blood loss (chronic): D50.0

## 2023-12-18 HISTORY — DX: Abnormal uterine and vaginal bleeding, unspecified: N93.9

## 2023-12-18 LAB — POCT I-STAT, CHEM 8
BUN: 16 mg/dL (ref 6–20)
Calcium, Ion: 1.08 mmol/L — ABNORMAL LOW (ref 1.15–1.40)
Chloride: 108 mmol/L (ref 98–111)
Creatinine, Ser: 0.5 mg/dL (ref 0.44–1.00)
Glucose, Bld: 89 mg/dL (ref 70–99)
HCT: 30 % — ABNORMAL LOW (ref 36.0–46.0)
Hemoglobin: 10.2 g/dL — ABNORMAL LOW (ref 12.0–15.0)
Potassium: 3.6 mmol/L (ref 3.5–5.1)
Sodium: 140 mmol/L (ref 135–145)
TCO2: 23 mmol/L (ref 22–32)

## 2023-12-18 LAB — POCT PREGNANCY, URINE: Preg Test, Ur: NEGATIVE

## 2023-12-18 SURGERY — HYSTEROSCOPY WITH MYOMECTOMY
Anesthesia: General | Site: Uterus

## 2023-12-18 MED ORDER — OXYCODONE HCL 5 MG PO TABS
ORAL_TABLET | ORAL | Status: AC
  Filled 2023-12-18: qty 1

## 2023-12-18 MED ORDER — PROPOFOL 10 MG/ML IV BOLUS
INTRAVENOUS | Status: AC
  Filled 2023-12-18: qty 20

## 2023-12-18 MED ORDER — ACETAMINOPHEN 10 MG/ML IV SOLN
INTRAVENOUS | Status: AC
  Filled 2023-12-18: qty 100

## 2023-12-18 MED ORDER — AMISULPRIDE (ANTIEMETIC) 5 MG/2ML IV SOLN: 10.0000 mg | Freq: Once | INTRAVENOUS | Status: DC | PRN

## 2023-12-18 MED ORDER — FENTANYL CITRATE (PF) 250 MCG/5ML IJ SOLN
INTRAMUSCULAR | Status: DC | PRN
  Administered 2023-12-18: 50 ug via INTRAVENOUS
  Administered 2023-12-18: 100 ug via INTRAVENOUS
  Administered 2023-12-18: 50 ug via INTRAVENOUS

## 2023-12-18 MED ORDER — FENTANYL CITRATE (PF) 250 MCG/5ML IJ SOLN
INTRAMUSCULAR | Status: AC
  Filled 2023-12-18: qty 5

## 2023-12-18 MED ORDER — KETOROLAC TROMETHAMINE 30 MG/ML IJ SOLN
INTRAMUSCULAR | Status: AC
  Filled 2023-12-18: qty 1

## 2023-12-18 MED ORDER — DEXAMETHASONE SODIUM PHOSPHATE 10 MG/ML IJ SOLN
INTRAMUSCULAR | Status: DC | PRN
  Administered 2023-12-18: 10 mg via INTRAVENOUS

## 2023-12-18 MED ORDER — CHLORHEXIDINE GLUCONATE 0.12 % MT SOLN: 15.0000 mL | Freq: Once | OROMUCOSAL | Status: AC

## 2023-12-18 MED ORDER — LACTATED RINGERS IV SOLN: INTRAVENOUS | Status: DC

## 2023-12-18 MED ORDER — OXYCODONE HCL 5 MG/5ML PO SOLN: 5.0000 mg | Freq: Once | ORAL | Status: AC | PRN

## 2023-12-18 MED ORDER — KETOROLAC TROMETHAMINE 30 MG/ML IJ SOLN
30.0000 mg | Freq: Once | INTRAMUSCULAR | Status: AC | PRN
  Administered 2023-12-18: 30 mg via INTRAVENOUS

## 2023-12-18 MED ORDER — HYDROMORPHONE HCL 1 MG/ML IJ SOLN
0.2500 mg | INTRAMUSCULAR | Status: DC | PRN
  Administered 2023-12-18 (×4): 0.5 mg via INTRAVENOUS

## 2023-12-18 MED ORDER — HYDROMORPHONE HCL 1 MG/ML IJ SOLN
INTRAMUSCULAR | Status: AC
  Filled 2023-12-18: qty 1

## 2023-12-18 MED ORDER — LIDOCAINE HCL (PF) 1 % IJ SOLN
INTRAMUSCULAR | Status: AC
Start: 2023-12-18 — End: ?
  Filled 2023-12-18: qty 30

## 2023-12-18 MED ORDER — LIDOCAINE 2% (20 MG/ML) 5 ML SYRINGE
INTRAMUSCULAR | Status: DC | PRN
  Administered 2023-12-18: 80 mg via INTRAVENOUS

## 2023-12-18 MED ORDER — CHLORHEXIDINE GLUCONATE 0.12 % MT SOLN
OROMUCOSAL | Status: AC
  Administered 2023-12-18: 15 mL via OROMUCOSAL
  Filled 2023-12-18: qty 15

## 2023-12-18 MED ORDER — ACETAMINOPHEN 500 MG PO TABS
ORAL_TABLET | ORAL | Status: DC
Start: 2023-12-18 — End: 2023-12-18
  Filled 2023-12-18: qty 2

## 2023-12-18 MED ORDER — MEPERIDINE HCL 25 MG/ML IJ SOLN: 6.2500 mg | INTRAMUSCULAR | Status: DC | PRN

## 2023-12-18 MED ORDER — CELECOXIB 200 MG PO CAPS
ORAL_CAPSULE | ORAL | Status: AC
  Filled 2023-12-18: qty 2

## 2023-12-18 MED ORDER — MIDAZOLAM HCL 2 MG/2ML IJ SOLN
INTRAMUSCULAR | Status: AC
  Filled 2023-12-18: qty 2

## 2023-12-18 MED ORDER — ACETAMINOPHEN 500 MG PO TABS: 1000.0000 mg | ORAL_TABLET | ORAL | Status: DC

## 2023-12-18 MED ORDER — SODIUM CHLORIDE 0.9 % IR SOLN
Status: DC | PRN
  Administered 2023-12-18 (×2): 3000 mL

## 2023-12-18 MED ORDER — CELECOXIB 200 MG PO CAPS: 400.0000 mg | ORAL_CAPSULE | ORAL | Status: DC

## 2023-12-18 MED ORDER — MIDAZOLAM HCL 2 MG/2ML IJ SOLN
INTRAMUSCULAR | Status: DC | PRN
  Administered 2023-12-18: 2 mg via INTRAVENOUS

## 2023-12-18 MED ORDER — OXYCODONE HCL 5 MG PO TABS
5.0000 mg | ORAL_TABLET | Freq: Once | ORAL | Status: AC | PRN
  Administered 2023-12-18: 5 mg via ORAL

## 2023-12-18 MED ORDER — ACETAMINOPHEN 10 MG/ML IV SOLN
INTRAVENOUS | Status: DC | PRN
  Administered 2023-12-18: 1000 mg via INTRAVENOUS

## 2023-12-18 MED ORDER — LIDOCAINE HCL (PF) 1 % IJ SOLN
INTRAMUSCULAR | Status: DC | PRN
  Administered 2023-12-18: 10 mL

## 2023-12-18 MED ORDER — ONDANSETRON HCL 4 MG/2ML IJ SOLN: 4.0000 mg | Freq: Once | INTRAMUSCULAR | Status: DC | PRN

## 2023-12-18 MED ORDER — ONDANSETRON HCL 4 MG/2ML IJ SOLN
INTRAMUSCULAR | Status: DC | PRN
  Administered 2023-12-18: 4 mg via INTRAVENOUS

## 2023-12-18 MED ORDER — PROPOFOL 10 MG/ML IV BOLUS
INTRAVENOUS | Status: DC | PRN
  Administered 2023-12-18: 200 mg via INTRAVENOUS

## 2023-12-18 MED ORDER — ORAL CARE MOUTH RINSE: 15.0000 mL | Freq: Once | OROMUCOSAL | Status: AC

## 2023-12-18 SURGICAL SUPPLY — 19 items
ABLATOR SURESOUND NOVASURE (ABLATOR) ×1 IMPLANT
CATH ROBINSON RED A/P 16FR (CATHETERS) IMPLANT
COVER MAYO STAND STRL (DRAPES) ×1 IMPLANT
DEVICE MYOSURE LITE (MISCELLANEOUS) IMPLANT
DEVICE MYOSURE REACH (MISCELLANEOUS) IMPLANT
DILATOR CANAL MILEX (MISCELLANEOUS) ×1 IMPLANT
GLOVE BIO SURGEON STRL SZ7 (GLOVE) ×1 IMPLANT
GLOVE BIOGEL PI IND STRL 7.0 (GLOVE) ×1 IMPLANT
GLOVE SURG UNDER POLY LF SZ7 (GLOVE) ×2 IMPLANT
GOWN STRL REUS W/ TWL XL LVL3 (GOWN DISPOSABLE) ×1 IMPLANT
KIT PROCEDURE FLUENT (KITS) ×1 IMPLANT
KIT TURNOVER KIT B (KITS) ×1 IMPLANT
PACK VAGINAL MINOR WOMEN LF (CUSTOM PROCEDURE TRAY) ×1 IMPLANT
PAD OB MATERNITY 11 LF (PERSONAL CARE ITEMS) ×1 IMPLANT
SEAL ROD LENS SCOPE MYOSURE (ABLATOR) ×1 IMPLANT
SOL .9 NS 3000ML IRR UROMATIC (IV SOLUTION) IMPLANT
SYSTEM TISS REMOVAL MYOSURE XL (MISCELLANEOUS) IMPLANT
TOWEL GREEN STERILE FF (TOWEL DISPOSABLE) ×1 IMPLANT
UNDERPAD 30X36 HEAVY ABSORB (UNDERPADS AND DIAPERS) ×1 IMPLANT

## 2023-12-18 NOTE — Transfer of Care (Signed)
 Immediate Anesthesia Transfer of Care Note  Patient: [name]  Procedure(s) Performed: HYSTEROSCOPY WITH MYOSURE MYOMECTOMY (Uterus) ABLATION, ENDOMETRIUM, HYSTEROSCOPIC (Uterus)  Patient Location: PACU  Anesthesia Type:General  Level of Consciousness: awake and alert   Airway & Oxygen Therapy: Patient Spontanous Breathing and Patient connected to face mask oxygen  Post-op Assessment: Report given to RN and Post -op Vital signs reviewed and stable  Post vital signs: Reviewed and stable  Last Vitals:  Vitals Value Taken Time  BP 131/81 12/18/23 1206  Temp    Pulse 66 12/18/23 1210  Resp 13 12/18/23 1210  SpO2 100 % 12/18/23 1210  Vitals shown include unfiled device data.  Last Pain:  Vitals:   12/18/23 0911  TempSrc: Oral  PainSc: 0-No pain      Patients Stated Pain Goal: 5 (12/18/23 0911)  Complications: No notable events documented.

## 2023-12-18 NOTE — Discharge Instructions (Signed)

## 2023-12-18 NOTE — Anesthesia Procedure Notes (Signed)
 Procedure Name: LMA Insertion Date/Time: 12/18/2023 10:59 AM  Performed by: Hershall Lory, CRNAPre-anesthesia Checklist: Patient identified, Emergency Drugs available, Suction available and Patient being monitored Patient Re-evaluated:Patient Re-evaluated prior to induction Oxygen Delivery Method: Circle System Utilized Preoxygenation: Pre-oxygenation with 100% oxygen Induction Type: IV induction Ventilation: Mask ventilation without difficulty LMA: LMA inserted LMA Size: 4.0 Number of attempts: 1 Airway Equipment and Method: Bite block Placement Confirmation: positive ETCO2 Tube secured with: Tape Dental Injury: Teeth and Oropharynx as per pre-operative assessment

## 2023-12-18 NOTE — Interval H&P Note (Signed)
 Date of Initial H&P: 12/16/2023  History reviewed, patient examined, no change in status, stable for surgery.

## 2023-12-18 NOTE — Anesthesia Preprocedure Evaluation (Addendum)
 Anesthesia Evaluation  Patient identified by MRN, date of birth, ID band Patient awake    Reviewed: Allergy & Precautions, H&P , NPO status , Patient's Chart, lab work & pertinent test results  Airway Mallampati: II  TM Distance: >3 FB Neck ROM: Full    Dental  (+) Teeth Intact, Dental Advisory Given   Pulmonary former smoker   Pulmonary exam normal breath sounds clear to auscultation       Cardiovascular hypertension (130/68 preop), Pt. on medications Normal cardiovascular exam Rhythm:Regular Rate:Normal     Neuro/Psych  PSYCHIATRIC DISORDERS Anxiety Depression    negative neurological ROS     GI/Hepatic Neg liver ROS,GERD  Medicated and Controlled,,  Endo/Other  BMI 33  Renal/GU negative Renal ROS  negative genitourinary   Musculoskeletal negative musculoskeletal ROS (+)    Abdominal   Peds negative pediatric ROS (+)  Hematology  (+) Blood dyscrasia, anemia   Anesthesia Other Findings   Reproductive/Obstetrics negative OB ROS                             Anesthesia Physical Anesthesia Plan  ASA: 2  Anesthesia Plan: General   Post-op Pain Management: Tylenol  PO (pre-op)* and Toradol  IV (intra-op)*   Induction: Intravenous  PONV Risk Score and Plan: 4 or greater and Ondansetron , Dexamethasone, Midazolam  and Treatment may vary due to age or medical condition  Airway Management Planned: LMA  Additional Equipment: None  Intra-op Plan:   Post-operative Plan: Extubation in OR  Informed Consent: I have reviewed the patients History and Physical, chart, labs and discussed the procedure including the risks, benefits and alternatives for the proposed anesthesia with the patient or authorized representative who has indicated his/her understanding and acceptance.     Dental advisory given  Plan Discussed with: CRNA  Anesthesia Plan Comments:        Anesthesia Quick  Evaluation

## 2023-12-18 NOTE — Op Note (Signed)
 12/18/2023 [name] [id number]  OPERATIVE REPORT  Preop Diagnosis: Pre-Op Diagnosis Codes:     * Abnormal uterine bleeding (AUB) [N93.9]    * Uterine leiomyoma, unspecified location [D25.9]  Endometrial polyp  Post operative diagnosis: same  Procedure: Hysteroscopic myomectomy, polypectomy, myosure-directed endometrial biopsies, Novasure endometrial ablation  Surgeon: Romaine Closs, MD Assistant: none  Anesthesia: MAC Fluids: please see anesthesia report Fluid deficit: [id number]  Complications: None  Findings: Normal cervix. Uterine cavity measuring 12cm with both ostia seen. Fundal submucosal fibroid and posterior endometrial polyp.  Estimated blood loss: Minimal  Specimens:  ID Type Source Tests Collected by Time Destination  1 : submucosal fibroid endometrial polyp Tissue PATH Gyn biopsy SURGICAL PATHOLOGY Romaine Closs, MD 12/18/2023 1148     Disposition of specimen: Pathology    Procedure: Patient was taken to the OR where she was placed in dorsal lithotomy in stirrups. She voided prior to transport. SCDs were in place.  The patient was prepped and draped in the usual sterile fashion. An adequate timeout was obtained and everyone agreed. A bivalve speculum was placed inside the vagina and the cervix visualized. The cervix was grasped anteriorly with a single-tooth tenaculum. Paracervical block was performed with 1% lidocaine .  The uterus sounded to 12 cm. Sequential cervical dilation was done to 31fr given cervical contraction, and the myosure hysteroscope was introduced into the uterine cavity. The cervix and endometrial lining appeared normal both ostia were seen. Findings as noted.  Fundal submucosal fibroid and posterior endometrial polyp were morcellated using the Myosure. Fundal fibroid was morcellated about 80% due to its location at the fundus. The myosure was also used to sample the entire cavity.    The Novasure uterine sound device was used to check the  measurement of the uterine cavity, and it was then removed.  The Novsure device was set with a fundal length of 6cm, and it was then introduced into the endometrial cavity.  It was set in position and the uterine width was determined to be 4.8cm.   A CO2 test was performed, and the patient passed the test.  The Novasure was then deployed with a power of 158 watts over 38 seconds.  The device was removed from the uterus.  The device was intact.   All instrument, sponge and lap counts were correct x2. The patient was awakened taken to recovery room in stable condition.  Romaine Closs MD 12/18/2023 12:03 PM

## 2023-12-21 ENCOUNTER — Encounter (HOSPITAL_COMMUNITY): Payer: Self-pay | Admitting: Obstetrics and Gynecology

## 2023-12-21 ENCOUNTER — Ambulatory Visit: Payer: Self-pay | Admitting: Obstetrics and Gynecology

## 2023-12-21 LAB — SURGICAL PATHOLOGY

## 2023-12-21 NOTE — Anesthesia Postprocedure Evaluation (Signed)
 Anesthesia Post Note  Patient: [name]  Procedure(s) Performed: HYSTEROSCOPY WITH MYOSURE MYOMECTOMY (Uterus) ABLATION, ENDOMETRIUM, HYSTEROSCOPIC (Uterus)     Patient location during evaluation: PACU Anesthesia Type: General Level of consciousness: awake and alert, oriented and patient cooperative Pain management: pain level controlled Vital Signs Assessment: post-procedure vital signs reviewed and stable Respiratory status: spontaneous breathing, nonlabored ventilation and respiratory function stable Cardiovascular status: blood pressure returned to baseline and stable Postop Assessment: no apparent nausea or vomiting Anesthetic complications: no   No notable events documented.  Last Vitals:  Vitals:   12/18/23 1230 12/18/23 1245  BP: 127/81 132/85  Pulse: 70 71  Resp: 12 11  Temp:    SpO2: 100% 100%    Last Pain:  Vitals:   12/18/23 1349  TempSrc:   PainSc: 9    Pain Goal: Patients Stated Pain Goal: 5 (12/18/23 1206)                 Jacquelyne Matte

## 2023-12-22 ENCOUNTER — Encounter: Payer: Self-pay | Admitting: *Deleted

## 2023-12-22 NOTE — Telephone Encounter (Signed)
 Okay to send work note for patient missing work today?

## 2023-12-22 NOTE — Telephone Encounter (Signed)
 Called patient to review symptoms.  S/p uncx Hysteroscopic myomectomy, polypectomy, myosure-directed endometrial biopsies, Novasure endometrial ablation on 12/18/23.  Called office today reporting N/V and constipation. She reports had emesis on Friday after eating following surgery. She was able to tolerate a reg diet on Saturday, but she has significant chronic constipation and uses Fleet enema.  This produced a bowel movement, but she has not had another bowel movement since.  She has been experiencing nausea as well.  She had another episode of vomiting this morning, though she has been tolerating foods through the rest of the weekend.  She took Zofran  for nausea twice a day.  She is also taking MiraLAX  twice today and recently used a Fleet enema.  She has been passing flatus. Drinking water today. No pelvic pain. Vaginal bleeding has resolved.  Chronic constipation is usually managed with Miralax . Will use fleet enema if needed.  In agreement with management of chronic constipation. Recommend adding stool softner like colace or senna-kot. Continue zofran  TID. Bland diet reviewed. If still having emesis tomorrow or worsens through the night, proceed to ED.

## 2023-12-22 NOTE — Telephone Encounter (Signed)
 I tried contacting patient. I left a message on her voicemail for her to call us  back.

## 2023-12-22 NOTE — Telephone Encounter (Signed)
 Work note sent to [employer]. Message sent to patient to get update on her symptoms.

## 2023-12-23 NOTE — Telephone Encounter (Signed)
 Called to patient, left message advising f/u to see how she is feeling. Return call to office at [phone], opt 4 or send MyChart update.

## 2023-12-31 ENCOUNTER — Encounter: Admitting: Obstetrics and Gynecology

## 2023-12-31 NOTE — Telephone Encounter (Signed)
 Spoke with patient, post op rescheduled to 6/26 at 1430.   Routing to provider for final review. Patient is agreeable to disposition. Will close encounter.

## 2024-01-06 ENCOUNTER — Encounter: Admitting: Gastroenterology

## 2024-01-07 ENCOUNTER — Ambulatory Visit (INDEPENDENT_AMBULATORY_CARE_PROVIDER_SITE_OTHER): Admitting: Obstetrics and Gynecology

## 2024-01-07 VITALS — BP 126/62 | HR 81 | Temp 99.0°F | Wt 193.0 lb

## 2024-01-07 DIAGNOSIS — Z09 Encounter for follow-up examination after completed treatment for conditions other than malignant neoplasm: Secondary | ICD-10-CM

## 2024-01-07 DIAGNOSIS — D259 Leiomyoma of uterus, unspecified: Secondary | ICD-10-CM

## 2024-01-07 NOTE — Progress Notes (Signed)
 43 y.o. [id number] female s/p BTL, 2016, IDA (s/p IV Fe with GI), AUB, HTN now 2wk s/p hysteroscopic myomectomy and polypectomy and endometrial ablation who presents for postop exam. 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   No LMP recorded. (Menstrual status: Irregular Periods).   She reports first two days after surgery she had sharp pain but not pain since then. Some abdominal cramping. N/V resolved. Denies vaginal bleeding, abdominal pain, dysuria. Normal BM and voids.  12/18/2023 pathology: A. ENDOMETRIUM, POLYPECTOMY:  - Endometrial polyp.  - Proliferative endometrium.  - Fragments of myometrium compatible with leiomyoma.  - Negative for atypia/EIN and malignancy   GYN HISTORY: Prior CD   OB History  Gravida Para Term Preterm AB Living  7 3 3  0 4 3  SAB IAB Ectopic Multiple Live Births  1 3 0 0 3    # Outcome Date GA Lbr Len/2nd Weight Sex Type Anes PTL Lv  7 Term 11/03/14 [redacted]w[redacted]d  7 lb 15 oz (3.6 kg) F CS-LTranv Spinal  LIV  6 IAB           5 IAB           4 IAB           3 SAB           2 Term     F CS-LTranv  N      Birth Comments: repeat; AICU after del for BP  1 Term    10 lb 13 oz (4.905 kg) M CS-LTranv  N LIV   Past Medical History:  Diagnosis Date   Abnormal uterine bleeding (AUB)    Anal fissure    GI--- dr leigh   Anxiety    Depression    GERD (gastroesophageal reflux disease)    EGD 03-30-2023 w/ gastritis   Heart murmur    Hemorrhoids    s/p  hemorrhoid banding ligation 10-08-2023 by dr leigh   History of trichomonal vaginitis 01/2023   Hypertension    Iron deficiency anemia due to chronic blood loss    treated w/ iron infusions;   01/ 2025 x2;   08/ 2024 x2   Seasonal allergic rhinitis    Uterine leiomyoma    Vitamin D  deficiency 11/13/2016   Past Surgical History:  Procedure Laterality Date   CESAREAN SECTION  04/22/2000   @[facility]  by dr b. nikki   CESAREAN SECTION WITH BILATERAL TUBAL LIGATION Bilateral 11/03/2014   Procedure: CESAREAN  SECTION WITH BILATERAL TUBAL LIGATION;  Surgeon: Carlin DELENA Centers, MD;  Location: [facility];  Service: Obstetrics;  Laterality: Bilateral;   CHOLECYSTECTOMY, LAPAROSCOPIC  01/26/2001   @[facility]   by dr d. blackman   COLONOSCOPY WITH ESOPHAGOGASTRODUODENOSCOPY (EGD)  03/30/2023   dr leigh   HYSTEROSCOPY N/A 12/18/2023   Procedure: ABLATION, ENDOMETRIUM, HYSTEROSCOPIC;  Surgeon: Dallie Vera GAILS, MD;  Location: [facility] OR;  Service: Gynecology;  Laterality: N/A;  NOVASURE   HYSTEROSCOPY WITH MYOMECTOMY N/A 12/18/2023   Procedure: HYSTEROSCOPY WITH MYOSURE MYOMECTOMY;  Surgeon: Dallie Vera GAILS, MD;  Location: [facility] OR;  Service: Gynecology;  Laterality: N/A;   INCISIONAL HERNIA REPAIR N/A 03/27/2016   Procedure: LAPAROSCOPIC ASSISTED REPAIR OF INCARCERATED  INCISIONAL HERNIA;  Surgeon: Camellia Blush, MD;  Location: [facility];  Service: General;  Laterality: N/A;   INSERTION OF MESH N/A 03/27/2016   Procedure: INSERTION OF MESH;  Surgeon: Camellia Blush, MD;  Location: [facility];  Service: General;  Laterality: N/A;   REPEAT CESAREAN SECTION  07/10/2008   @[facility]   by dr c. rudy   Current Outpatient Medications on File Prior to Visit  Medication Sig Dispense Refill   AMBULATORY NON FORMULARY MEDICATION Diltiazem gel with 2% lidocaine   Apply a pea sized amount into your rectum three times daily until healed Dispense 30 GM zero refill 30 g 1   amLODipine  (NORVASC ) 5 MG tablet Take 1 tablet (5 mg total) by mouth at bedtime. 90 tablet 3   Azelastine HCl 137 MCG/SPRAY SOLN Place 2 sprays into the nose daily as needed.     ferrous sulfate  325 (65 FE) MG tablet Take 1 tablet (325 mg total) by mouth every other day. 45 tablet 0   ibuprofen  (ADVIL ) 800 MG tablet Take 1 tablet (800 mg total) by mouth every 8 (eight) hours as needed for up to 42 doses. 42 tablet 0   omeprazole  (PRILOSEC) 20 MG capsule Take 1 capsule (20 mg total) by mouth daily. 90 capsule 3   ondansetron  (ZOFRAN ) 8 MG tablet TAKE 1 TABLET BY MOUTH EVERY 8 HOURS AS  NEEDED FOR NAUSEA 30 tablet 0   Relugolix-Estradiol -Norethind (MYFEMBREE ) 40-1-0.5 MG TABS Take 1 tablet by mouth daily. 28 tablet 11   Wheat Dextrin (BENEFIBER) POWD Use as directed, daily     No current facility-administered medications on file prior to visit.   Allergies  Allergen Reactions   Levofloxacin Itching      PE Today's Vitals   01/07/24 1422  BP: 126/62  Pulse: 81  Temp: 99 F (37.2 C)  TempSrc: Oral  SpO2: 98%  Weight: 193 lb (87.5 kg)   Body mass index is 33.13 kg/m.  Physical Exam Vitals reviewed.  Constitutional:      General: She is not in acute distress.    Appearance: Normal appearance.  HENT:     Head: Normocephalic and atraumatic.     Nose: Nose normal.   Eyes:     Extraocular Movements: Extraocular movements intact.     Conjunctiva/sclera: Conjunctivae normal.   Pulmonary:     Effort: Pulmonary effort is normal.  Abdominal:     General: There is no distension.     Palpations: Abdomen is soft.     Tenderness: There is no abdominal tenderness.   Musculoskeletal:        General: Normal range of motion.     Cervical back: Normal range of motion.   Neurological:     General: No focal deficit present.     Mental Status: She is alert.   Psychiatric:        Mood and Affect: Mood normal.        Behavior: Behavior normal.      Assessment and Plan:        Postoperative examination Normal postop exam.  Pathology reviewed with patient. Cleared to return to work. All questions answered.   Uterine leiomyoma, unspecified location -     US  PELVIS TRANSVAGINAL NON-OB (TV ONLY); Future  Pt wants to continue meyfembree for fibroids and cycle suppression following surgery RTO in 4 months for f/u US  D/C myfembree  at that time  Vera LULLA Pa, MD

## 2024-01-19 ENCOUNTER — Encounter: Payer: Self-pay | Admitting: Obstetrics and Gynecology

## 2024-01-19 ENCOUNTER — Other Ambulatory Visit: Payer: Self-pay | Admitting: Obstetrics and Gynecology

## 2024-01-19 DIAGNOSIS — Z01818 Encounter for other preprocedural examination: Secondary | ICD-10-CM

## 2024-01-19 MED ORDER — IBUPROFEN 800 MG PO TABS: 800.0000 mg | ORAL_TABLET | Freq: Three times a day (TID) | ORAL | 0 refills | Status: DC | PRN

## 2024-01-19 MED ORDER — ONDANSETRON HCL 8 MG PO TABS: 8.0000 mg | ORAL_TABLET | Freq: Three times a day (TID) | ORAL | 0 refills | Status: DC | PRN

## 2024-01-20 NOTE — Telephone Encounter (Signed)
 Med refill request: Zofran  and Ibuprofen  Last OV: 01/07/24 Next AEX: not scheduled, will send message to front desk to schedule patient for annual visit. Last MMG (if hormonal med) n/a Refill authorized: Last Rx sent Ibuprofen  #42 with zero refills on 01/19/24; Zofran  #30 with zero refills on 01/19/24, please deny as appropriate.

## 2024-01-21 NOTE — Telephone Encounter (Signed)
 Duplicate

## 2024-01-27 ENCOUNTER — Ambulatory Visit: Admitting: Family Medicine

## 2024-01-27 ENCOUNTER — Encounter: Payer: Self-pay | Admitting: Family Medicine

## 2024-01-27 VITALS — BP 138/88 | Ht 64.0 in | Wt 192.0 lb

## 2024-01-27 DIAGNOSIS — M25561 Pain in right knee: Secondary | ICD-10-CM | POA: Diagnosis not present

## 2024-01-27 DIAGNOSIS — G8929 Other chronic pain: Secondary | ICD-10-CM

## 2024-01-27 DIAGNOSIS — M79662 Pain in left lower leg: Secondary | ICD-10-CM

## 2024-01-27 DIAGNOSIS — M79661 Pain in right lower leg: Secondary | ICD-10-CM | POA: Diagnosis not present

## 2024-01-27 DIAGNOSIS — M25562 Pain in left knee: Secondary | ICD-10-CM

## 2024-01-27 DIAGNOSIS — M722 Plantar fascial fibromatosis: Secondary | ICD-10-CM

## 2024-01-27 MED ORDER — DICLOFENAC SODIUM 75 MG PO TBEC: 75.0000 mg | DELAYED_RELEASE_TABLET | Freq: Two times a day (BID) | ORAL | 1 refills | Status: DC

## 2024-01-27 NOTE — Patient Instructions (Addendum)
 I expect the diclofenac  will be covered now that you've not improved with both meloxicam  and ibuprofen . When you take this don't take meloxicam  or ibuprofen  with this. We will add physical therapy for your knees as well. For plantar fascia do home exercises daily. Arch supports are very important. Avoid flat shoes, barefoot walking. We can see you back for your hip but the rehab is the most important part of treatment for this - you were seen for this on April 16th. Follow up with us  in 5-6 weeks.

## 2024-01-27 NOTE — Progress Notes (Cosign Needed)
 PCP: Alba Sharper, MD  Subjective:   HPI: Patient is a 43 y.o. female here for evaluation of bilateral knee and shin pain as well as right heel pain.  She states that symptoms have been present for years and have gradually worsened in recent months.  She cannot identify any inciting event or trauma to explain the worsening of pain, but notes that she is on her feet for extensive periods of time at work.  She also has to wear steel toed shoes at work.  She has previously been evaluated for bilateral hip pain that was attributed to greater trochanteric pain syndrome, IT band syndrome, and glute medius tendinopathy.  She was referred to physical therapy, which she will start Friday (7/18).  Most recently, she has been managing pain with ibuprofen .  Pain is equal along the left and right knee and worsens throughout the day - located both anterior and posterior.  Right plantar heel pain also worsens throughout the day to the point that she walks on her toes by the end of the day.    Past Medical History:  Diagnosis Date   Abnormal uterine bleeding (AUB)    Anal fissure    GI--- dr leigh   Anxiety    Depression    GERD (gastroesophageal reflux disease)    EGD 03-30-2023 w/ gastritis   Heart murmur    Hemorrhoids    s/p  hemorrhoid banding ligation 10-08-2023 by dr leigh   History of trichomonal vaginitis 01/2023   Hypertension    Iron deficiency anemia due to chronic blood loss    treated w/ iron infusions;   01/ 2025 x2;   08/ 2024 x2   Seasonal allergic rhinitis    Uterine leiomyoma    Vitamin D  deficiency 11/13/2016    Current Outpatient Medications on File Prior to Visit  Medication Sig Dispense Refill   AMBULATORY NON FORMULARY MEDICATION Diltiazem gel with 2% lidocaine   Apply a pea sized amount into your rectum three times daily until healed Dispense 30 GM zero refill 30 g 1   amLODipine  (NORVASC ) 5 MG tablet Take 1 tablet (5 mg total) by mouth at bedtime. 90 tablet 3    Azelastine HCl 137 MCG/SPRAY SOLN Place 2 sprays into the nose daily as needed.     ferrous sulfate  325 (65 FE) MG tablet Take 1 tablet (325 mg total) by mouth every other day. 45 tablet 0   ibuprofen  (ADVIL ) 800 MG tablet Take 1 tablet (800 mg total) by mouth every 8 (eight) hours as needed for up to 42 doses. 42 tablet 0   omeprazole  (PRILOSEC) 20 MG capsule Take 1 capsule (20 mg total) by mouth daily. 90 capsule 3   ondansetron  (ZOFRAN ) 8 MG tablet Take 1 tablet (8 mg total) by mouth every 8 (eight) hours as needed. for nausea 30 tablet 0   Relugolix-Estradiol -Norethind (MYFEMBREE ) 40-1-0.5 MG TABS Take 1 tablet by mouth daily. 28 tablet 11   Wheat Dextrin (BENEFIBER) POWD Use as directed, daily     No current facility-administered medications on file prior to visit.    Past Surgical History:  Procedure Laterality Date   CESAREAN SECTION  04/22/2000   @[facility]  by dr b. nikki   CESAREAN SECTION WITH BILATERAL TUBAL LIGATION Bilateral 11/03/2014   Procedure: CESAREAN SECTION WITH BILATERAL TUBAL LIGATION;  Surgeon: Carlin DELENA Centers, MD;  Location: [facility];  Service: Obstetrics;  Laterality: Bilateral;   CHOLECYSTECTOMY, LAPAROSCOPIC  01/26/2001   @[facility]   by dr d. vernetta  COLONOSCOPY WITH ESOPHAGOGASTRODUODENOSCOPY (EGD)  03/30/2023   dr leigh   HYSTEROSCOPY N/A 12/18/2023   Procedure: ABLATION, ENDOMETRIUM, HYSTEROSCOPIC;  Surgeon: Dallie Vera GAILS, MD;  Location: [facility] OR;  Service: Gynecology;  Laterality: N/A;  NOVASURE   HYSTEROSCOPY WITH MYOMECTOMY N/A 12/18/2023   Procedure: HYSTEROSCOPY WITH MYOSURE MYOMECTOMY;  Surgeon: Dallie Vera GAILS, MD;  Location: [facility] OR;  Service: Gynecology;  Laterality: N/A;   INCISIONAL HERNIA REPAIR N/A 03/27/2016   Procedure: LAPAROSCOPIC ASSISTED REPAIR OF INCARCERATED  INCISIONAL HERNIA;  Surgeon: Camellia Blush, MD;  Location: [facility];  Service: General;  Laterality: N/A;   INSERTION OF MESH N/A 03/27/2016   Procedure: INSERTION OF MESH;  Surgeon: Camellia Blush, MD;  Location: [facility];  Service: General;  Laterality: N/A;   REPEAT CESAREAN SECTION  07/10/2008   @[facility]   by dr c. rudy    Allergies  Allergen Reactions   Levofloxacin Itching    There were no vitals taken for this visit.      No data to display              No data to display              Objective:  Physical Exam:  Gen: NAD, comfortable in exam room  Bilateral Knees No gross deformity, ecchymoses, swelling. TTP suprapatellar region at site of quadriceps tendon insertion and posteriorly over popliteus muscle.  There is also tenderness to palpation over anterior tibialis bilaterally FROM with normal strength. Negative ant/post drawers. Negative valgus/varus testing. Negative lachman.  Negative mcmurrays, apleys.  NV intact distally.   R Heel No obvious deformity TTP plantar aspect right heel and plantar fascia FROM ankle. Negative calcaneal squeeze   Assessment & Plan:  1.  Chronic pain of both knees: Presenting today for evaluation of bilateral knee and shin pain gradually worsening over several years as described above.  She is scheduled to start physical therapy for bilateral hip pain later this week.  History and exam findings today suggest tendinopathy of multiple sites, including quadriceps, popliteus, and anterior tibialis.  Treatment options reviewed.  Recommend as needed use of NSAIDs for pain relief.  Given lack of sustained response with ibuprofen  and meloxicam .  Will prescribe diclofenac .  Recommend adding PT for tendinopathy noted above to treatment plan for bilateral hip pain.  New PT orders placed today.  We discussed that wearing compression braces over her knees may help as well.  She will return to care if symptoms worsen or fail to improve.  2. Right heel pain: She endorses pain along the plantar aspect of the right heel.  Exam findings suggest plantar fasciitis.  Treatment options reviewed.  Diclofenac  prescribed today as noted above.   Home PT exercises provided.  I have also recommended that she purchase shoe inserts to help with comfort and stability while at work.

## 2024-01-28 ENCOUNTER — Encounter: Payer: Self-pay | Admitting: Family Medicine

## 2024-01-28 NOTE — Therapy (Incomplete)
 OUTPATIENT PHYSICAL THERAPY LOWER EXTREMITY EVALUATION   Patient Name: [name] MRN: [id number] DOB:[birth year], 43 y.o., female Today's Date: 01/28/2024  END OF SESSION:   Past Medical History:  Diagnosis Date   Abnormal uterine bleeding (AUB)    Anal fissure    GI--- dr leigh   Anxiety    Depression    GERD (gastroesophageal reflux disease)    EGD 03-30-2023 w/ gastritis   Heart murmur    Hemorrhoids    s/p  hemorrhoid banding ligation 10-08-2023 by dr leigh   History of trichomonal vaginitis 01/2023   Hypertension    Iron deficiency anemia due to chronic blood loss    treated w/ iron infusions;   01/ 2025 x2;   08/ 2024 x2   Seasonal allergic rhinitis    Uterine leiomyoma    Vitamin D  deficiency 11/13/2016   Past Surgical History:  Procedure Laterality Date   CESAREAN SECTION  04/22/2000   @[facility]  by dr b. nikki   CESAREAN SECTION WITH BILATERAL TUBAL LIGATION Bilateral 11/03/2014   Procedure: CESAREAN SECTION WITH BILATERAL TUBAL LIGATION;  Surgeon: Carlin DELENA Centers, MD;  Location: [facility];  Service: Obstetrics;  Laterality: Bilateral;   CHOLECYSTECTOMY, LAPAROSCOPIC  01/26/2001   @[facility]   by dr d. blackman   COLONOSCOPY WITH ESOPHAGOGASTRODUODENOSCOPY (EGD)  03/30/2023   dr leigh   HYSTEROSCOPY N/A 12/18/2023   Procedure: ABLATION, ENDOMETRIUM, HYSTEROSCOPIC;  Surgeon: Dallie Vera GAILS, MD;  Location: [facility] OR;  Service: Gynecology;  Laterality: N/A;  NOVASURE   HYSTEROSCOPY WITH MYOMECTOMY N/A 12/18/2023   Procedure: HYSTEROSCOPY WITH MYOSURE MYOMECTOMY;  Surgeon: Dallie Vera GAILS, MD;  Location: [facility] OR;  Service: Gynecology;  Laterality: N/A;   INCISIONAL HERNIA REPAIR N/A 03/27/2016   Procedure: LAPAROSCOPIC ASSISTED REPAIR OF INCARCERATED  INCISIONAL HERNIA;  Surgeon: Camellia Blush, MD;  Location: [facility];  Service: General;  Laterality: N/A;   INSERTION OF MESH N/A 03/27/2016   Procedure: INSERTION OF MESH;  Surgeon: Camellia Blush, MD;  Location: [facility];  Service:  General;  Laterality: N/A;   REPEAT CESAREAN SECTION  07/10/2008   @[facility]   by dr c. harper   Patient Active Problem List   Diagnosis Date Noted   Endometrial polyp 12/18/2023   Murmur, cardiac 12/04/2023   Uterine leiomyoma 11/12/2023   Abnormal uterine bleeding (AUB) 09/23/2023   Greater trochanteric pain syndrome 04/08/2023   Hypertension 02/08/2023   Iron deficiency anemia 02/05/2023   Dysmenorrhea 05/08/2022   External hemorrhoid 01/02/2021   Knee pain, bilateral 09/24/2016    PCP: Alba Sharper, MD   REFERRING PROVIDER:    Cleatrice Ludie SAUNDERS, MD     REFERRING DIAG:  M25.561,M25.562,G89.29 (ICD-10-CM) - Chronic pain of both knees  M79.661,M79.662 (ICD-10-CM) - Pain in both lower legs    THERAPY DIAG:  No diagnosis found.  Rationale for Evaluation and Treatment: Rehabilitation  ONSET DATE: ***  SUBJECTIVE:   SUBJECTIVE STATEMENT: ***  PERTINENT HISTORY: *** PAIN:  Are you having pain? Yes: NPRS scale: *** Pain location: *** Pain description: *** Aggravating factors: *** Relieving factors: ***  PRECAUTIONS: {Therapy precautions:24002}  RED FLAGS: {PT Red Flags:29287}   WEIGHT BEARING RESTRICTIONS: {Yes ***/No:24003}  FALLS:  Has patient fallen in last 6 months? {fallsyesno:27318}  LIVING ENVIRONMENT: Lives with: {[facility] lives with:25569::lives with their family} Lives in: {Lives in:25570} Stairs: {opstairs:27293} Has following equipment at home: {Assistive devices:23999}  OCCUPATION: ***  PLOF: {PLOF:24004}  PATIENT GOALS: ***  NEXT MD VISIT: ***  OBJECTIVE:  Note: Objective measures were  completed at Evaluation unless otherwise noted.  DIAGNOSTIC FINDINGS:  MR R hip 10/03/23 IMPRESSION: 1. Mild-moderate osteoarthritis of bilateral hips. 2. Mild tendinosis of the gluteus minimus tendon insertion bilaterally. 3. No hip fracture, dislocation or avascular necrosis. 4. Partially visualized enlarged fibroid uterus with the  largest fibroid measuring 4.5 cm.  MR L hip 10/03/23 IMPRESSION: 1. Mild-moderate osteoarthritis of bilateral hips. 2. Mild tendinosis of the gluteus minimus tendon insertion bilaterally. 3. No hip fracture, dislocation or avascular necrosis. 4. Partially visualized enlarged fibroid uterus with the largest fibroid measuring 4.5 cm.  PATIENT SURVEYS:  {rehab surveys:24030}  COGNITION: Overall cognitive status: {cognition:24006}     SENSATION: {sensation:27233}  EDEMA:  {edema:24020}  MUSCLE LENGTH: Hamstrings: Right *** deg; Left *** deg Debby test: Right *** deg; Left *** deg  POSTURE: {posture:25561}  PALPATION: ***  LOWER EXTREMITY ROM:  {AROM/PROM:27142} ROM Right eval Left eval  Hip flexion    Hip extension    Hip abduction    Hip adduction    Hip internal rotation    Hip external rotation    Knee flexion    Knee extension    Ankle dorsiflexion    Ankle plantarflexion    Ankle inversion    Ankle eversion     (Blank rows = not tested)  LOWER EXTREMITY MMT:  MMT Right eval Left eval  Hip flexion    Hip extension    Hip abduction    Hip adduction    Hip internal rotation    Hip external rotation    Knee flexion    Knee extension    Ankle dorsiflexion    Ankle plantarflexion    Ankle inversion    Ankle eversion     (Blank rows = not tested)  LOWER EXTREMITY SPECIAL TESTS:  {LEspecialtests:26242}  FUNCTIONAL TESTS:  {Functional tests:24029}  GAIT: Distance walked: *** Assistive device utilized: {Assistive devices:23999} Level of assistance: {Levels of assistance:24026} Comments: ***                                                                                                                                TREATMENT DATE:   [facility] Adult PT Treatment:                                                DATE: 01/29/24 Therapeutic Exercise: *** Manual Therapy: *** Neuromuscular re-ed: *** Therapeutic  Activity: *** Modalities: *** Self Care: ***    PATIENT EDUCATION:  Education details: *** Person educated: {Person educated:25204} Education method: {Education Method:25205} Education comprehension: {Education Comprehension:25206}  HOME EXERCISE PROGRAM: ***  ASSESSMENT:  CLINICAL IMPRESSION: Patient is a 43 y.o. female who was seen today for physical therapy evaluation and treatment for  M25.561,M25.562,G89.29 (ICD-10-CM) - Chronic pain of both knees  M79.661,M79.662 (ICD-10-CM) - Pain in both lower legs  Evaluate and treat for B/L quadriceps tendinopathy. Include exercises for the popliteus and anterior tibialis in treatment plan. Please add this to current treatment plan for B/L hip pain.   OBJECTIVE IMPAIRMENTS: {opptimpairments:25111}.   ACTIVITY LIMITATIONS: {activitylimitations:27494}  PARTICIPATION LIMITATIONS: {participationrestrictions:25113}  PERSONAL FACTORS: {Personal factors:25162} are also affecting patient's functional outcome.   REHAB POTENTIAL: {rehabpotential:25112}  CLINICAL DECISION MAKING: {clinical decision making:25114}  EVALUATION COMPLEXITY: {Evaluation complexity:25115}   GOALS: Goals reviewed with patient? {yes/no:20286}  SHORT TERM GOALS: Target date: *** *** Baseline: Goal status: INITIAL  2.  *** Baseline:  Goal status: INITIAL  3.  *** Baseline:  Goal status: INITIAL  4.  *** Baseline:  Goal status: INITIAL  5.  *** Baseline:  Goal status: INITIAL  6.  *** Baseline:  Goal status: INITIAL  LONG TERM GOALS: Target date: ***  *** Baseline:  Goal status: INITIAL  2.  *** Baseline:  Goal status: INITIAL  3.  *** Baseline:  Goal status: INITIAL  4.  *** Baseline:  Goal status: INITIAL  5.  *** Baseline:  Goal status: INITIAL  6.  *** Baseline:  Goal status: INITIAL   PLAN:  PT FREQUENCY: {rehab frequency:25116}  PT DURATION: {rehab duration:25117}  PLANNED INTERVENTIONS: {rehab planned  interventions:25118::97110-Therapeutic exercises,97530- Therapeutic [phone]- Neuromuscular re-education,97535- Self Rjmz,02859- Manual therapy}  PLAN FOR NEXT SESSION: ***   Littleton Haub, PT 01/28/2024, 10:01 PM

## 2024-01-29 ENCOUNTER — Ambulatory Visit: Attending: Family Medicine

## 2024-01-29 DIAGNOSIS — M25552 Pain in left hip: Secondary | ICD-10-CM | POA: Insufficient documentation

## 2024-01-29 DIAGNOSIS — M25551 Pain in right hip: Secondary | ICD-10-CM | POA: Insufficient documentation

## 2024-01-29 DIAGNOSIS — M6281 Muscle weakness (generalized): Secondary | ICD-10-CM | POA: Insufficient documentation

## 2024-02-01 ENCOUNTER — Other Ambulatory Visit: Payer: Self-pay

## 2024-02-01 MED ORDER — DICLOFENAC SODIUM 75 MG PO TBEC: 75.0000 mg | DELAYED_RELEASE_TABLET | Freq: Two times a day (BID) | ORAL | 0 refills | Status: DC

## 2024-02-01 NOTE — Progress Notes (Signed)
 Pt asked to send Rx to [facility] pharmacy and she will use a GoodRx coupon since her insurance denied it.

## 2024-02-02 ENCOUNTER — Other Ambulatory Visit: Payer: Self-pay

## 2024-02-02 MED ORDER — NAPROXEN 500 MG PO TABS: 500.0000 mg | ORAL_TABLET | Freq: Two times a day (BID) | ORAL | 1 refills | Status: DC | PRN

## 2024-02-02 NOTE — Progress Notes (Signed)
 Pt called stating she took diclofenac  at work and it made her drowsy and she had to leave work. Pt is asking if another med can be sent in that she can take during the day while at work.   Per Dr. Cleatrice - will try naproxen  500 mg BID PRN with food.  Rx sent to pharmacy.

## 2024-02-04 ENCOUNTER — Other Ambulatory Visit: Payer: Self-pay | Admitting: *Deleted

## 2024-02-04 MED ORDER — MELOXICAM 15 MG PO TABS
ORAL_TABLET | ORAL | 1 refills | Status: DC
Start: 2024-02-04 — End: 2024-03-14

## 2024-02-09 ENCOUNTER — Ambulatory Visit

## 2024-02-09 DIAGNOSIS — M6281 Muscle weakness (generalized): Secondary | ICD-10-CM | POA: Diagnosis present

## 2024-02-09 DIAGNOSIS — M25552 Pain in left hip: Secondary | ICD-10-CM | POA: Diagnosis present

## 2024-02-09 DIAGNOSIS — M25551 Pain in right hip: Secondary | ICD-10-CM | POA: Diagnosis present

## 2024-02-09 NOTE — Therapy (Signed)
 OUTPATIENT PHYSICAL THERAPY LOWER EXTREMITY EVALUATION   Patient Name: [name] MRN: [id number] DOB:16-Jul-1980, 43 y.o., female Today's Date: 02/09/2024  END OF SESSION:  PT End of Session - 02/09/24 1807     Visit Number 1    Number of Visits 6    Date for PT Re-Evaluation 04/11/24    Authorization Type MCD    PT Start Time 1710    PT Stop Time 1800    PT Time Calculation (min) 50 min    Activity Tolerance Patient tolerated treatment well    Behavior During Therapy [facility] for tasks assessed/performed          Past Medical History:  Diagnosis Date   Abnormal uterine bleeding (AUB)    Anal fissure    GI--- dr leigh   Anxiety    Depression    GERD (gastroesophageal reflux disease)    EGD 03-30-2023 w/ gastritis   Heart murmur    Hemorrhoids    s/p  hemorrhoid banding ligation 10-08-2023 by dr leigh   History of trichomonal vaginitis 01/2023   Hypertension    Iron deficiency anemia due to chronic blood loss    treated w/ iron infusions;   01/ 2025 x2;   08/ 2024 x2   Seasonal allergic rhinitis    Uterine leiomyoma    Vitamin D  deficiency 11/13/2016   Past Surgical History:  Procedure Laterality Date   CESAREAN SECTION  04/22/2000   @[facility]  by dr b. nikki   CESAREAN SECTION WITH BILATERAL TUBAL LIGATION Bilateral 11/03/2014   Procedure: CESAREAN SECTION WITH BILATERAL TUBAL LIGATION;  Surgeon: Carlin DELENA Centers, MD;  Location: [facility];  Service: Obstetrics;  Laterality: Bilateral;   CHOLECYSTECTOMY, LAPAROSCOPIC  01/26/2001   @[facility]   by dr d. blackman   COLONOSCOPY WITH ESOPHAGOGASTRODUODENOSCOPY (EGD)  03/30/2023   dr leigh   HYSTEROSCOPY N/A 12/18/2023   Procedure: ABLATION, ENDOMETRIUM, HYSTEROSCOPIC;  Surgeon: Dallie Vera GAILS, MD;  Location[facility] OR;  Service: Gynecology;  Laterality: N/A;  NOVASURE   HYSTEROSCOPY WITH MYOMECTOMY N/A 12/18/2023   Procedure: HYSTEROSCOPY WITH MYOSURE MYOMECTOMY;  Surgeon: Dallie Vera GAILS, MD;  Location: [facility] OR;  Service:  Gynecology;  Laterality: N/A;   INCISIONAL HERNIA REPAIR N/A 03/27/2016   Procedure: LAPAROSCOPIC ASSISTED REPAIR OF INCARCERATED  INCISIONAL HERNIA;  Surgeon: Camellia Blush, MD;  Location: [facility];  Service: General;  Laterality: N/A;   INSERTION OF MESH N/A 03/27/2016   Procedure: INSERTION OF MESH;  Surgeon: Camellia Blush, MD;  Location: [facility] ORS;  Service: General;  Laterality: N/A;   REPEAT CESAREAN SECTION  07/10/2008   @[facility]   by dr c. harper   Patient Active Problem List   Diagnosis Date Noted   Endometrial polyp 12/18/2023   Murmur, cardiac 12/04/2023   Uterine leiomyoma 11/12/2023   Abnormal uterine bleeding (AUB) 09/23/2023   Greater trochanteric pain syndrome 04/08/2023   Hypertension 02/08/2023   Iron deficiency anemia 02/05/2023   Dysmenorrhea 05/08/2022   External hemorrhoid 01/02/2021   Knee pain, bilateral 09/24/2016    PCP: Alba Sharper, MD   REFERRING PROVIDER: Cleatrice Ludie SAUNDERS, MD  REFERRING DIAG: M25.561,M25.562,G89.29 (ICD-10-CM) - Chronic pain of both knees M79.661,M79.662 (ICD-10-CM) - Pain in both lower legs  THERAPY DIAG:  Pain of both hip joints  Rationale for Evaluation and Treatment: Rehabilitation  ONSET DATE: chronic  SUBJECTIVE:   SUBJECTIVE STATEMENT: Patient is a 43 y.o. female here for evaluation of bilateral knee and shin pain as well as right heel pain.  She states  that symptoms have been present for years and have gradually worsened in recent months.  She cannot identify any inciting event or trauma to explain the worsening of pain, but notes that she is on her feet for extensive periods of time at work.  She also has to wear steel toed shoes at work.  She has previously been evaluated for bilateral hip pain that was attributed to greater trochanteric pain syndrome, IT band syndrome, and glute medius tendinopathy.  She was referred to physical therapy, which she will start Friday (7/18).  Most recently, she has been managing pain with ibuprofen .   Pain is equal along the left and right knee and worsens throughout the day - located both anterior and posterior.  Right plantar heel pain also worsens throughout the day to the point that she walks on her toes by the end of the day.   PERTINENT HISTORY:   Chronic pain of both knees: Presenting today for evaluation of bilateral knee and shin pain gradually worsening over several years as described above.  She is scheduled to start physical therapy for bilateral hip pain later this week.  History and exam findings today suggest tendinopathy of multiple sites, including quadriceps, popliteus, and anterior tibialis.  Treatment options reviewed.  Recommend as needed use of NSAIDs for pain relief.  Given lack of sustained response with ibuprofen  and meloxicam .  Will prescribe diclofenac .  Recommend adding PT for tendinopathy noted above to treatment plan for bilateral hip pain.  New PT orders placed today.  We discussed that wearing compression braces over her knees may help as well.  She will return to care if symptoms worsen or fail to improve.   2. Right heel pain: She endorses pain along the plantar aspect of the right heel.  Exam findings suggest plantar fasciitis.  Treatment options reviewed.  Diclofenac  prescribed today as noted above.  Home PT exercises provided.  I have also recommended that she purchase shoe inserts to help with comfort and stability while at work. PAIN:  Are you having pain? Yes: NPRS scale: 9-10/10 Pain location: B hips  Pain description: ache Aggravating factors: walking, prolonged standing/sitting/walking, stairs Relieving factors: position change, meds  PRECAUTIONS: None  RED FLAGS: None   WEIGHT BEARING RESTRICTIONS: No  FALLS:  Has patient fallen in last 6 months? No  OCCUPATION: Company secretary  PLOF: Independent  PATIENT GOALS: To manage my hip pain  NEXT MD VISIT: 02/10/24  OBJECTIVE:  Note: Objective measures were completed at Evaluation unless  otherwise noted.  DIAGNOSTIC FINDINGS: IMPRESSION: 1. Mild-moderate osteoarthritis of bilateral hips. 2. Mild tendinosis of the gluteus minimus tendon insertion bilaterally. 3. No hip fracture, dislocation or avascular necrosis. 4. Partially visualized enlarged fibroid uterus with the largest fibroid measuring 4.5 cm.     Electronically Signed   By: Julaine Blanch M.D.   On: 10/18/2023 08:14  PATIENT SURVEYS:  LEFS    MUSCLE LENGTH: Hamstrings: Right 90 deg; Left 90 deg PKB negative  POSTURE: No Significant postural limitations  PALPATION: TTP B gluteus medius  LOWER EXTREMITY ROM: WNL in knees and hips  Active ROM Right eval Left eval  Hip flexion    Hip extension    Hip abduction    Hip adduction    Hip internal rotation    Hip external rotation    Knee flexion    Knee extension    Ankle dorsiflexion    Ankle plantarflexion    Ankle inversion    Ankle eversion     (  Blank rows = not tested)  LOWER EXTREMITY MMT:  MMT Right eval Left eval  Hip flexion 4 4  Hip extension 4 4  Hip abduction 4 4  Hip adduction    Hip internal rotation    Hip external rotation    Knee flexion 4 4  Knee extension 4 4  Ankle dorsiflexion    Ankle plantarflexion    Ankle inversion    Ankle eversion     (Blank rows = not tested)  LOWER EXTREMITY SPECIAL TESTS:  Hip special tests: Belvie (FABER) test: negative, Thomas test: negative, Ober's test: negative, and Anterior hip impingement test: negative  FUNCTIONAL TESTS:  30 seconds chair stand test 0  GAIT: Distance walked: 27ftx2 Assistive device utilized: None Level of assistance: Complete Independence                                                                                                                                 TREATMENT:  [facility] Adult PT Treatment:                                                DATE: 02/09/24 Eval and HEP Self Care: Additional minutes spent for educating on updated Therapeutic  Home Exercise Program as well as comparing current status to condition at start of symptoms. This included exercises focusing on stretching, strengthening, with focus on eccentric aspects. Long term goals include an improvement in range of motion, strength, endurance as well as avoiding reinjury. Patient's frequency would include in 1-2 times a day, 3-5 times a week for a duration of 6-12 weeks. Proper technique shown and discussed handout in great detail. All questions were discussed and addressed.     PATIENT EDUCATION:  Education details: Discussed eval findings, rehab rationale and POC and patient is in agreement  Person educated: Patient Education method: Explanation and Handouts Education comprehension: verbalized understanding and needs further education  HOME EXERCISE PROGRAM: Access Code: [email or web address] URL: [email or web address] Date: 02/09/2024 Prepared by: Reyes Kohut  Exercises - Supine Bridge with Resistance Band  - 1 x daily - 5 x weekly - 3 sets - 10 reps - Hooklying Single Leg Bent Knee Fallouts with Resistance  - 1 x daily - 5 x weekly - 3 sets - 10 reps  ASSESSMENT:  CLINICAL IMPRESSION: Patient is a 43 y.o. female who was seen today for physical therapy evaluation and treatment for B hip pain with concurrent L knee pain. ROM in B hips/knees is WNL.  No flexibility deficits noted in hamstrings, hip flexors or ITBs.  Mild TTP at gluteus medius muscles.  Main limitation was L knee pain, level of deconditioning, multiple pain sites and suspected myofascial pain.  Rehab potential is fair.  OBJECTIVE IMPAIRMENTS: decreased activity tolerance, decreased knowledge of condition, difficulty walking, decreased strength, impaired perceived  functional ability, and pain.   PERSONAL FACTORS: Fitness, Past/current experiences, and Time since onset of injury/illness/exacerbation are also affecting patient's functional outcome.   REHAB POTENTIAL: Fair based on chronicity and  deconditioning  CLINICAL DECISION MAKING: Stable/uncomplicated  EVALUATION COMPLEXITY: Low   GOALS: Goals reviewed with patient? No    SHORT TERM GOALS=LONG TERM GOALS: Target date: 04/11/24  Patient to demonstrate independence in HEP  Baseline: [id number] Goal status: INITIAL  2.  Patient will acknowledge 6/10 pain at least once during episode of care  Baseline: 9-10/10 Goal status: INITIAL  3.  Patient will increase 30s chair stand reps from 0 to 4 with/without arms to demonstrate and improved functional ability with less pain/difficulty as well as reduce fall risk.  Baseline: 0 Goal status: INITIAL  4.  Patient will score at least 33/80 on LEFS  to signify clinically meaningful improvement in functional abilities.   Baseline: 23 Goal status: INITIAL   PLAN:  PT FREQUENCY: 1x/week  PT DURATION: 6 weeks  PLANNED INTERVENTIONS: 97110-Therapeutic exercises, 97530- Therapeutic activity, 97112- Neuromuscular re-education, 97535- Self Care, [id number]- Manual therapy, Patient/Family education, Balance training, and Stair training  PLAN FOR NEXT SESSION: HEP review and update, manual techniques as appropriate, aerobic tasks, ROM and flexibility activities, strengthening and PREs, TPDN, gait and balance training as needed    For all possible CPT codes, reference the Planned Interventions line above.     Check all conditions that are expected to impact treatment: {Conditions expected to impact treatment:None of these apply   If treatment provided at initial evaluation, no treatment charged due to lack of authorization.       Shavonda Wiedman M Vincie Linn, PT 02/09/2024, 6:09 PM

## 2024-02-10 ENCOUNTER — Ambulatory Visit: Admitting: Family Medicine

## 2024-02-10 ENCOUNTER — Encounter: Payer: Self-pay | Admitting: Family Medicine

## 2024-02-10 VITALS — BP 138/88 | Ht 64.0 in | Wt 192.0 lb

## 2024-02-10 DIAGNOSIS — M25551 Pain in right hip: Secondary | ICD-10-CM | POA: Diagnosis present

## 2024-02-10 DIAGNOSIS — M25552 Pain in left hip: Secondary | ICD-10-CM

## 2024-02-10 MED ORDER — KETOROLAC TROMETHAMINE 60 MG/2ML IM SOLN
60.0000 mg | Freq: Once | INTRAMUSCULAR | Status: AC
  Administered 2024-02-10: 60 mg via INTRAMUSCULAR

## 2024-02-10 MED ORDER — METHYLPREDNISOLONE ACETATE 40 MG/ML IJ SUSP
80.0000 mg | Freq: Once | INTRAMUSCULAR | Status: AC
  Administered 2024-02-10: 80 mg via INTRAMUSCULAR

## 2024-02-10 NOTE — Progress Notes (Cosign Needed)
 PCP: Alba Sharper, MD  Subjective:   HPI: Patient is a 43 y.o. female here for follow-up of chronic bilateral hip and knee pain, specifically requesting Toradol  and Depo-Medrol  injections.  She was last evaluated 7/16 endorsing gradually worsening bilateral knee and shin pain over a several year timeframe.  Pain was attributed to tendinopathy of multiple sites (quadriceps, popliteus, and anterior tibialis).  Additional PT for tendinopathy was added to her treatment plan, which she had already been referred to in the setting of chronic bilateral hip pain.  Diclofenac  was prescribed for pain relief and then switched to meloxicam  at her request.    Today she says her pain is essentially unchanged from her last appointment 2 weeks ago.  She attended her first physical therapy session yesterday (7/29) and says it was quite painful.  She is concerned that she has developed swelling around her left knee.  Currently, her left knee is causing more pain than the right and is worse with extension.  She last received Toradol /Depo-Medrol  injections on 4/30.  She believes that these injections provided significant pain relief previously.  She says she is completing home physical therapy exercises as provided at her last appointment.   Past Medical History:  Diagnosis Date   Abnormal uterine bleeding (AUB)    Anal fissure    GI--- dr leigh   Anxiety    Depression    GERD (gastroesophageal reflux disease)    EGD 03-30-2023 w/ gastritis   Heart murmur    Hemorrhoids    s/p  hemorrhoid banding ligation 10-08-2023 by dr leigh   History of trichomonal vaginitis 01/2023   Hypertension    Iron deficiency anemia due to chronic blood loss    treated w/ iron infusions;   01/ 2025 x2;   08/ 2024 x2   Seasonal allergic rhinitis    Uterine leiomyoma    Vitamin D  deficiency 11/13/2016    Current Outpatient Medications on File Prior to Visit  Medication Sig Dispense Refill   AMBULATORY NON FORMULARY  MEDICATION Diltiazem gel with 2% lidocaine   Apply a pea sized amount into your rectum three times daily until healed Dispense 30 GM zero refill 30 g 1   amLODipine  (NORVASC ) 5 MG tablet Take 1 tablet (5 mg total) by mouth at bedtime. 90 tablet 3   Azelastine HCl 137 MCG/SPRAY SOLN Place 2 sprays into the nose daily as needed.     diclofenac  (VOLTAREN ) 75 MG EC tablet Take 1 tablet (75 mg total) by mouth 2 (two) times daily. 60 tablet 0   ferrous sulfate  325 (65 FE) MG tablet Take 1 tablet (325 mg total) by mouth every other day. 45 tablet 0   meloxicam  (MOBIC ) 15 MG tablet Take one pill a day with food for 7 days and then prn thereafter 30 tablet 1   naproxen  (NAPROSYN ) 500 MG tablet Take 1 tablet (500 mg total) by mouth 2 (two) times daily as needed. Take with food. 60 tablet 1   omeprazole  (PRILOSEC) 20 MG capsule Take 1 capsule (20 mg total) by mouth daily. 90 capsule 3   ondansetron  (ZOFRAN ) 8 MG tablet Take 1 tablet (8 mg total) by mouth every 8 (eight) hours as needed. for nausea 30 tablet 0   Relugolix-Estradiol -Norethind (MYFEMBREE ) 40-1-0.5 MG TABS Take 1 tablet by mouth daily. 28 tablet 11   Wheat Dextrin (BENEFIBER) POWD Use as directed, daily     No current facility-administered medications on file prior to visit.    Past Surgical History:  Procedure Laterality Date   CESAREAN SECTION  04/22/2000   @[facility]  by dr b. nikki   CESAREAN SECTION WITH BILATERAL TUBAL LIGATION Bilateral 11/03/2014   Procedure: CESAREAN SECTION WITH BILATERAL TUBAL LIGATION;  Surgeon: Carlin DELENA Centers, MD;  Location: [facility] OR[facility];  Service: Obstetrics;  Laterality: Bilateral;   CHOLECYSTECTOMY, LAPAROSCOPIC  01/26/2001   @[facility]   by dr d. blackman   COLONOSCOPY WITH ESOPHAGOGASTRODUODENOSCOPY (EGD)  03/30/2023   dr leigh   HYSTEROSCOPY N/A 12/18/2023   Procedure: ABLATION, ENDOMETRIUM, HYSTEROSCOPIC;  Surgeon: Dallie Vera GAILS, MD;  Location: [facility] OR;  Service: Gynecology;  Laterality: N/A;  NOVASURE    HYSTEROSCOPY WITH MYOMECTOMY N/A 12/18/2023   Procedure: HYSTEROSCOPY WITH MYOSURE MYOMECTOMY;  Surgeon: Dallie Vera GAILS, MD;  Location: [facility] OR;  Service: Gynecology;  Laterality: N/A;   INCISIONAL HERNIA REPAIR N/A 03/27/2016   Procedure: LAPAROSCOPIC ASSISTED REPAIR OF INCARCERATED  INCISIONAL HERNIA;  Surgeon: Camellia Blush, MD;  Location: [facility];  Service: General;  Laterality: N/A;   INSERTION OF MESH N/A 03/27/2016   Procedure: INSERTION OF MESH;  Surgeon: Camellia Blush, MD;  Location: [facility]S;  Service: General;  Laterality: N/A;   REPEAT CESAREAN SECTION  07/10/2008   @[facility]   by dr c. centers    Allergies  Allergen Reactions   Levofloxacin Itching    There were no vitals taken for this visit.      No data to display              No data to display              Objective:  Physical Exam:  Gen: NAD, comfortable in exam room  Bilateral hips: No obvious deformity on inspection Full range of motion with 5/5 strength with abduction, adduction, and flexion There is tenderness to palpation over the greater trochanters bilaterally.  No significant pain is elicited with palpation along the sites of insertion of gluteus medius/minimus Negative logroll, FABER,  FADIR, and Ober's  Bilateral knees No obvious deformity or swelling noted There is tenderness palpation over the suprapatellar region of the left knee at the site of quadriceps tendon insertion and posteriorly over popliteus bilaterally.  No significant tenderness palpation over anterior tibialis bilaterally today. Negative anterior/posterior drawers, negative valgus/varus testing Negative Lachman's, McMurray's, and Apley's NV intact distally   Assessment & Plan:  1.  Chronic bilateral hip and knee pain Returning to care today requesting repeat Toradol  and Depo-Medrol  injections in the setting of chronic bilateral hip and knee pain. Hip pain has previously been attributed to a combination of IT band syndrome, greater  trochanteric pain syndrome, and gluteus medius tendinopathy.  Knee pain attributed to tendinopathy of multiple sites.  She recently started therapy and endorses compliance with home exercises.  Her exam today is essentially unchanged from her prior encounter on 7/16.  Additional treatment options reviewed. Toradol  60 mg and Depo-Medrol  80 mg IM injections today provided today for pain relief.  We reviewed that these are short-term options and that physical therapy remains the best solution for long-term, sustainable pain relief.  Plan: - Toradol  60 mg and Depo-Medrol  80 mg IM injections administered today - Counseled on the importance of physical therapy for long-term pain relief - Follow-up in 4-6 weeks

## 2024-02-10 NOTE — Patient Instructions (Signed)
 You were given injections of toradol  and depomedrol today. Continue physical therapy and do home exercises on days you don't go to therapy. Follow up with us  in about 1 month.

## 2024-02-22 ENCOUNTER — Ambulatory Visit: Admitting: Family Medicine

## 2024-02-27 ENCOUNTER — Ambulatory Visit: Attending: Family Medicine

## 2024-02-27 ENCOUNTER — Telehealth: Payer: Self-pay

## 2024-02-27 NOTE — Therapy (Incomplete)
 OUTPATIENT PHYSICAL THERAPY LOWER EXTREMITY EVALUATION   Patient Name: [name] MRN: [id number] DOB:21-Jun-1981, 43 y.o., female Today's Date: 02/27/2024  END OF SESSION:    Past Medical History:  Diagnosis Date   Abnormal uterine bleeding (AUB)    Anal fissure    GI--- dr leigh   Anxiety    Depression    GERD (gastroesophageal reflux disease)    EGD 03-30-2023 w/ gastritis   Heart murmur    Hemorrhoids    s/p  hemorrhoid banding ligation 10-08-2023 by dr leigh   History of trichomonal vaginitis 01/2023   Hypertension    Iron deficiency anemia due to chronic blood loss    treated w/ iron infusions;   01/ 2025 x2;   08/ 2024 x2   Seasonal allergic rhinitis    Uterine leiomyoma    Vitamin D  deficiency 11/13/2016   Past Surgical History:  Procedure Laterality Date   CESAREAN SECTION  04/22/2000   @[facility]  by dr b. nikki   CESAREAN SECTION WITH BILATERAL TUBAL LIGATION Bilateral 11/03/2014   Procedure: CESAREAN SECTION WITH BILATERAL TUBAL LIGATION;  Surgeon: Carlin DELENA Centers, MD;  Location: [facility];  Service: Obstetrics;  Laterality: Bilateral;   CHOLECYSTECTOMY, LAPAROSCOPIC  01/26/2001   @[facility]   by dr d. blackman   COLONOSCOPY WITH ESOPHAGOGASTRODUODENOSCOPY (EGD)  03/30/2023   dr leigh   HYSTEROSCOPY N/A 12/18/2023   Procedure: ABLATION, ENDOMETRIUM, HYSTEROSCOPIC;  Surgeon: Dallie Vera GAILS, MD;  Location: [facility];  Service: Gynecology;  Laterality: N/A;  NOVASURE   HYSTEROSCOPY WITH MYOMECTOMY N/A 12/18/2023   Procedure: HYSTEROSCOPY WITH MYOSURE MYOMECTOMY;  Surgeon: Dallie Vera GAILS, MD;  Location: [facility] OR;  Service: Gynecology;  Laterality: N/A;   INCISIONAL HERNIA REPAIR N/A 03/27/2016   Procedure: LAPAROSCOPIC ASSISTED REPAIR OF INCARCERATED  INCISIONAL HERNIA;  Surgeon: Camellia Blush, MD;  Location: [facility];  Service: General;  Laterality: N/A;   INSERTION OF MESH N/A 03/27/2016   Procedure: INSERTION OF MESH;  Surgeon: Camellia Blush, MD;  Location: [facility];   Service: General;  Laterality: N/A;   REPEAT CESAREAN SECTION  07/10/2008   @[facility]   by dr c. harper   Patient Active Problem List   Diagnosis Date Noted   Endometrial polyp 12/18/2023   Murmur, cardiac 12/04/2023   Uterine leiomyoma 11/12/2023   Abnormal uterine bleeding (AUB) 09/23/2023   Greater trochanteric pain syndrome 04/08/2023   Hypertension 02/08/2023   Iron deficiency anemia 02/05/2023   Dysmenorrhea 05/08/2022   External hemorrhoid 01/02/2021   Knee pain, bilateral 09/24/2016   PCP: Alba Sharper, MD   REFERRING PROVIDER: Cleatrice Ludie SAUNDERS, MD  REFERRING DIAG: M25.561,M25.562,G89.29 (ICD-10-CM) - Chronic pain of both knees M79.661,M79.662 (ICD-10-CM) - Pain in both lower legs  THERAPY DIAG:  No diagnosis found.  Rationale for Evaluation and Treatment: Rehabilitation  ONSET DATE: chronic  SUBJECTIVE:   SUBJECTIVE STATEMENT: 02/27/2024 ***   PERTINENT HISTORY:  Chronic pain of both knees,  Right heel pain  Relevant PMHx also includes HTN, Iron deficiency anemia, Greater trochanteric pain syndrome, Anxiety/Depression, Heart Murmur  PAIN:  Are you having pain? Yes: NPRS scale: 9-10/10 Pain location: B hips  Pain description: ache Aggravating factors: walking, prolonged standing/sitting/walking, stairs Relieving factors: position change, meds  PRECAUTIONS: None  RED FLAGS: None   WEIGHT BEARING RESTRICTIONS: No  FALLS:  Has patient fallen in last 6 months? No  OCCUPATION: Company secretary  PLOF: Independent  PATIENT GOALS: To manage my hip pain  NEXT MD VISIT: 02/10/24  OBJECTIVE:  Note: Objective  measures were completed at Evaluation unless otherwise noted.  DIAGNOSTIC FINDINGS: IMPRESSION: 1. Mild-moderate osteoarthritis of bilateral hips. 2. Mild tendinosis of the gluteus minimus tendon insertion bilaterally. 3. No hip fracture, dislocation or avascular necrosis. 4. Partially visualized enlarged fibroid uterus with the largest fibroid  measuring 4.5 cm.     Electronically Signed   By: Julaine Blanch M.D.   On: 10/18/2023 08:14  PATIENT SURVEYS:  LEFS    MUSCLE LENGTH: Hamstrings: Right 90 deg; Left 90 deg PKB negative  POSTURE: No Significant postural limitations  PALPATION: TTP B gluteus medius  LOWER EXTREMITY ROM: WNL in knees and hips  Active ROM Right eval Left eval  Hip flexion    Hip extension    Hip abduction    Hip adduction    Hip internal rotation    Hip external rotation    Knee flexion    Knee extension    Ankle dorsiflexion    Ankle plantarflexion    Ankle inversion    Ankle eversion     (Blank rows = not tested)  LOWER EXTREMITY MMT:  MMT Right eval Left eval  Hip flexion 4 4  Hip extension 4 4  Hip abduction 4 4  Hip adduction    Hip internal rotation    Hip external rotation    Knee flexion 4 4  Knee extension 4 4  Ankle dorsiflexion    Ankle plantarflexion    Ankle inversion    Ankle eversion     (Blank rows = not tested)  LOWER EXTREMITY SPECIAL TESTS:  Hip special tests: Belvie (FABER) test: negative, Thomas test: negative, Ober's test: negative, and Anterior hip impingement test: negative  FUNCTIONAL TESTS:  30 seconds chair stand test 0  GAIT: Distance walked: 74ftx2 Assistive device utilized: None Level of assistance: Complete Independence                                                                                                                                 TREATMENT:   [facility] Adult PT Treatment:                                                DATE: 02/27/2024  Therapeutic Exercise: NuStep  Supine LTR Supine HS stretch Supine TFL/ITB stretch  Supine glute bridges Supine ball squeeze (hip adduction)  STS Staggered stance STS   Manual Therapy: Myofascial Release/Mobilization with use of rolling pin/stick     [facility] Adult PT Treatment:                                                DATE: 02/09/24 Eval and HEP Self Care: Additional  minutes spent  for educating on updated Therapeutic Home Exercise Program as well as comparing current status to condition at start of symptoms. This included exercises focusing on stretching, strengthening, with focus on eccentric aspects. Long term goals include an improvement in range of motion, strength, endurance as well as avoiding reinjury. Patient's frequency would include in 1-2 times a day, 3-5 times a week for a duration of 6-12 weeks. Proper technique shown and discussed handout in great detail. All questions were discussed and addressed.     PATIENT EDUCATION:  Education details: Discussed eval findings, rehab rationale and POC and patient is in agreement  Person educated: Patient Education method: Explanation and Handouts Education comprehension: verbalized understanding and needs further education  HOME EXERCISE PROGRAM: Access Code: [email or web address] URL: [email or web address] Date: 02/09/2024 Prepared by: Reyes Kohut  Exercises - Supine Bridge with Resistance Band  - 1 x daily - 5 x weekly - 3 sets - 10 reps - Hooklying Single Leg Bent Knee Fallouts with Resistance  - 1 x daily - 5 x weekly - 3 sets - 10 reps  ASSESSMENT:  CLINICAL IMPRESSION: Patient is a 43 y.o. female who was seen today for physical therapy evaluation and treatment for B hip pain with concurrent L knee pain. ROM in B hips/knees is WNL.  No flexibility deficits noted in hamstrings, hip flexors or ITBs.  Mild TTP at gluteus medius muscles.  Main limitation was L knee pain, level of deconditioning, multiple pain sites and suspected myofascial pain.  Rehab potential is fair.  OBJECTIVE IMPAIRMENTS: decreased activity tolerance, decreased knowledge of condition, difficulty walking, decreased strength, impaired perceived functional ability, and pain.   PERSONAL FACTORS: Fitness, Past/current experiences, and Time since onset of injury/illness/exacerbation are also affecting patient's functional outcome.    REHAB POTENTIAL: Fair based on chronicity and deconditioning  CLINICAL DECISION MAKING: Stable/uncomplicated  EVALUATION COMPLEXITY: Low   GOALS: Goals reviewed with patient? No    SHORT TERM GOALS=LONG TERM GOALS: Target date: 04/11/24  Patient to demonstrate independence in HEP  Baseline: [id number] Goal status: INITIAL  2.  Patient will acknowledge 6/10 pain at least once during episode of care  Baseline: 9-10/10 Goal status: INITIAL  3.  Patient will increase 30s chair stand reps from 0 to 4 with/without arms to demonstrate and improved functional ability with less pain/difficulty as well as reduce fall risk.  Baseline: 0 Goal status: INITIAL  4.  Patient will score at least 33/80 on LEFS  to signify clinically meaningful improvement in functional abilities.   Baseline: 23 Goal status: INITIAL   PLAN:  PT FREQUENCY: 1x/week  PT DURATION: 6 weeks  PLANNED INTERVENTIONS: 97110-Therapeutic exercises, 97530- Therapeutic activity, 97112- Neuromuscular re-education, 97535- Self Care, 02859- Manual therapy, Patient/Family education, Balance training, and Stair training  PLAN FOR NEXT SESSION: HEP review and update, manual techniques as appropriate, aerobic tasks, ROM and flexibility activities, strengthening and PREs, TPDN, gait and balance training as needed    For all possible CPT codes, reference the Planned Interventions line above.     Check all conditions that are expected to impact treatment: {Conditions expected to impact treatment:None of these apply   If treatment provided at initial evaluation, no treatment charged due to lack of authorization.       Marko Molt, PT, DPT  02/27/2024 7:11 AM

## 2024-02-27 NOTE — Telephone Encounter (Signed)
 LVM regarding missed appt. Reminder of attendance policy and next scheduled PT session. - MJ

## 2024-03-01 ENCOUNTER — Telehealth: Payer: Self-pay

## 2024-03-01 ENCOUNTER — Encounter: Payer: Self-pay | Admitting: Student

## 2024-03-01 ENCOUNTER — Ambulatory Visit: Admitting: Student

## 2024-03-01 VITALS — BP 133/85 | HR 73 | Temp 98.6°F | Ht 64.0 in | Wt 181.0 lb

## 2024-03-01 DIAGNOSIS — R252 Cramp and spasm: Secondary | ICD-10-CM | POA: Diagnosis present

## 2024-03-01 DIAGNOSIS — R112 Nausea with vomiting, unspecified: Secondary | ICD-10-CM | POA: Diagnosis not present

## 2024-03-01 LAB — GLUCOSE, POCT (MANUAL RESULT ENTRY): POC Glucose: 108 mg/dL — AB (ref 70–99)

## 2024-03-01 MED ORDER — ONDANSETRON HCL 4 MG PO TABS: 4.0000 mg | ORAL_TABLET | Freq: Three times a day (TID) | ORAL | 0 refills | Status: DC | PRN

## 2024-03-01 NOTE — Progress Notes (Signed)
    SUBJECTIVE:   CHIEF COMPLAINT / HPI:   Muscle cramps Worsening muscle cramps for several weeks, with associated dry mouth.  She reports that she feels dehydrated, although she feels like she is drinking adequate fluids.  She has not noted new exposures, no new medications.  No risk factors for rhabdomyolysis present.  She has no increased thirst, no history of diabetes.  Nausea/vomiting Reports started yesterday.  Associated tactile fever.  No diarrhea.  No known sick contacts.  OBJECTIVE:   BP 133/85   Pulse 73   Temp 98.6 F (37 C) (Oral)   Ht 5' 4 (1.626 m)   Wt 181 lb (82.1 kg)   SpO2 100%   BMI 31.07 kg/m    General: NAD, pleasant HEENT: Throat not erythematous, no exudate, no deviation. Normal dentition.  Moist mucous membranes. Cardio: RRR, no MRG. Cap Refill <2s. Respiratory: CTAB, normal wob on RA GI: Abdomen is soft, not tender, not distended. BS present Skin: Warm and dry  ASSESSMENT/PLAN:   Assessment & Plan Muscle cramps Associated dry mouth.  Differential includes: Dehydration, electrolyte abnormality, hypothyroidism, iron deficiency anemia.  May also related to her MSK pathologies for which she sees sports medicine (IT band syndrome, gluteus medius tendinopathy, greater trochanteric pain syndrome). Keep Sjogren syndrome in differential. No identifiable risk factors for rhabdomyolysis at this time. - Future TSH, CBC with differential, magnesium , BMP - Recommend p.o. hydration - Return/ED precautions discussed - Follow-up in 1 to 2 weeks Nausea and vomiting, unspecified vomiting type Do not believe this related to muscle cramps above, as this is very acute. Suspect viral gastritis. Reassuringly point-of-care CBG 108, less likely to be DKA/HHS. - Zofran  4 mg ODT every 8 hours as needed    Gladis Church, DO [facility]

## 2024-03-01 NOTE — Telephone Encounter (Signed)
 Patient calls nurse line requesting same day appointment.   She reports that she woke up yesterday morning with vomiting. States that over the course of the day, she vomited about 6 times. She also reports having a looser than normal bowel movement. Reports chills, however, does not have thermometer at home to check temperature.   She also has concerns about muscle cramps that has been going on for the last week. Feels that these are occurring more often. Also endorses a dry mouth for the last three months.   She denies CP, palpitations or SHOB.   She is also requesting a note for work to excuse absence from yesterday.   Scheduled same day appointment for this afternoon with Dr. Howell.   Chiquita JAYSON English, RN

## 2024-03-01 NOTE — Patient Instructions (Addendum)
 It was great to see you! Thank you for allowing me to participate in your care!   I recommend that you always bring your medications to each appointment as this makes it easy to ensure we are on the correct medications and helps us  not miss when refills are needed.  Our plans for today:  - Please take Zofran  as needed for Nausea - I have placed orders for lab work, we will call you and make an appointment - Please make appointment for follow-up in 1-2 weeks - If you develop severe pain, nausea or vomiting that does not improve with Zofran  and you cannot eat/drink anything, severe chest pain, difficulty breathing please go to the emergency room  We are checking some labs today, I will call you if they are abnormal will send you a MyChart message or a letter if they are normal.  If you do not hear about your labs in the next 2 weeks please let us  know.  Take care and seek immediate care sooner if you develop any concerns. Please remember to show up 15 minutes before your scheduled appointment time!  Gladis Church, DO [facility]

## 2024-03-02 ENCOUNTER — Ambulatory Visit: Admitting: Family Medicine

## 2024-03-02 ENCOUNTER — Other Ambulatory Visit: Payer: Self-pay

## 2024-03-02 VITALS — BP 126/80 | Ht 64.0 in | Wt 180.0 lb

## 2024-03-02 DIAGNOSIS — M25561 Pain in right knee: Secondary | ICD-10-CM | POA: Diagnosis not present

## 2024-03-02 DIAGNOSIS — M25562 Pain in left knee: Secondary | ICD-10-CM | POA: Diagnosis not present

## 2024-03-02 DIAGNOSIS — G8929 Other chronic pain: Secondary | ICD-10-CM

## 2024-03-02 NOTE — Patient Instructions (Signed)
 Your ultrasound is reassuring. We will add some rheumatologic labs to the ones you're going to get up at family medicine (CRP, ANA, RF). We will contact you when these labs come back. You've tried naproxen  and meloxicam .  Consider ibuprofen  600mg  three times a day with food to see if this helps you more than those. Ok to take tylenol  with this.

## 2024-03-03 NOTE — Progress Notes (Signed)
 PCP: Alba Sharper, MD  Subjective:   HPI: Patient is a 43 y.o. female here for bilateral knee pain.  Patient continues to have anterior and posterior bilateral knee pain. No new injury or trauma. Has tried meloxicam . Felt more pain with physical therapy and home exercises. Has had hip pain bilaterally as well. Describes cramping in her feet and has some labwork ordered to follow up on this.  Past Medical History:  Diagnosis Date   Abnormal uterine bleeding (AUB)    Anal fissure    GI--- dr leigh   Anxiety    Depression    GERD (gastroesophageal reflux disease)    EGD 03-30-2023 w/ gastritis   Heart murmur    Hemorrhoids    s/p  hemorrhoid banding ligation 10-08-2023 by dr leigh   History of trichomonal vaginitis 01/2023   Hypertension    Iron deficiency anemia due to chronic blood loss    treated w/ iron infusions;   01/ 2025 x2;   08/ 2024 x2   Seasonal allergic rhinitis    Uterine leiomyoma    Vitamin D  deficiency 11/13/2016    Current Outpatient Medications on File Prior to Visit  Medication Sig Dispense Refill   AMBULATORY NON FORMULARY MEDICATION Diltiazem gel with 2% lidocaine   Apply a pea sized amount into your rectum three times daily until healed Dispense 30 GM zero refill 30 g 1   amLODipine  (NORVASC ) 5 MG tablet Take 1 tablet (5 mg total) by mouth at bedtime. 90 tablet 3   Azelastine HCl 137 MCG/SPRAY SOLN Place 2 sprays into the nose daily as needed.     diclofenac  (VOLTAREN ) 75 MG EC tablet Take 1 tablet (75 mg total) by mouth 2 (two) times daily. 60 tablet 0   ferrous sulfate  325 (65 FE) MG tablet Take 1 tablet (325 mg total) by mouth every other day. 45 tablet 0   meloxicam  (MOBIC ) 15 MG tablet Take one pill a day with food for 7 days and then prn thereafter 30 tablet 1   naproxen  (NAPROSYN ) 500 MG tablet Take 1 tablet (500 mg total) by mouth 2 (two) times daily as needed. Take with food. 60 tablet 1   omeprazole  (PRILOSEC) 20 MG capsule Take  1 capsule (20 mg total) by mouth daily. 90 capsule 3   ondansetron  (ZOFRAN ) 4 MG tablet Take 1 tablet (4 mg total) by mouth every 8 (eight) hours as needed for nausea or vomiting. 20 tablet 0   Relugolix-Estradiol -Norethind (MYFEMBREE ) 40-1-0.5 MG TABS Take 1 tablet by mouth daily. 28 tablet 11   Wheat Dextrin (BENEFIBER) POWD Use as directed, daily     No current facility-administered medications on file prior to visit.    Past Surgical History:  Procedure Laterality Date   CESAREAN SECTION  04/22/2000   @[facility]  by dr b. nikki   CESAREAN SECTION WITH BILATERAL TUBAL LIGATION Bilateral 11/03/2014   Procedure: CESAREAN SECTION WITH BILATERAL TUBAL LIGATION;  Surgeon: Carlin DELENA Centers, MD;  Location: [facility];  Service: Obstetrics;  Laterality: Bilateral;   CHOLECYSTECTOMY, LAPAROSCOPIC  01/26/2001   @[facility]   by dr d. blackman   COLONOSCOPY WITH ESOPHAGOGASTRODUODENOSCOPY (EGD)  03/30/2023   dr leigh   HYSTEROSCOPY N/A 12/18/2023   Procedure: ABLATION, ENDOMETRIUM, HYSTEROSCOPIC;  Surgeon: Dallie Vera GAILS, MD;  Location[facility] OR;  Service: Gynecology;  Laterality: N/A;  NOVASURE   HYSTEROSCOPY WITH MYOMECTOMY N/A 12/18/2023   Procedure: HYSTEROSCOPY WITH MYOSURE MYOMECTOMY;  Surgeon: Dallie Vera GAILS, MD;  Location: [facility] OR;  Service:  Gynecology;  Laterality: N/A;   INCISIONAL HERNIA REPAIR N/A 03/27/2016   Procedure: LAPAROSCOPIC ASSISTED REPAIR OF INCARCERATED  INCISIONAL HERNIA;  Surgeon: Camellia Blush, MD;  Location: [facility];  Service: General;  Laterality: N/A;   INSERTION OF MESH N/A 03/27/2016   Procedure: INSERTION OF MESH;  Surgeon: Camellia Blush, MD;  Location: [facility];  Service: General;  Laterality: N/A;   REPEAT CESAREAN SECTION  07/10/2008   @[facility]   by dr c. rudy    Allergies  Allergen Reactions   Levofloxacin Itching    BP 126/80   Ht 5' 4 (1.626 m)   Wt 180 lb (81.6 kg)   BMI 30.90 kg/m       No data to display              No data to display               Objective:  Physical Exam:  Gen: NAD, comfortable in exam room  Bilateral knees: No gross deformity, ecchymoses, swelling. TTP joint lines, post patellar facets, and popliteal fossa. FROM with normal strength. Negative ant/post drawers. Negative valgus/varus testing. Negative lachman.  Negative mcmurrays, apleys.  NV intact distally.   Limited MSK u/s bilateral knees:  no effusion.  Quad and patellar tendons normal.  No baker's cyst.  Venous structures compressible in popliteal fossa.  Assessment & Plan:  1. Bilateral knee pain - uncertain cause.  Ultrasound and exam reassuring.  Check CRP, ANA, RF given polyarthralgias.  Consider ibuprofen  as naproxen  and meloxicam  have not helped.

## 2024-03-04 ENCOUNTER — Other Ambulatory Visit

## 2024-03-04 DIAGNOSIS — M25562 Pain in left knee: Secondary | ICD-10-CM | POA: Diagnosis not present

## 2024-03-04 DIAGNOSIS — M25561 Pain in right knee: Secondary | ICD-10-CM | POA: Diagnosis not present

## 2024-03-04 DIAGNOSIS — R252 Cramp and spasm: Secondary | ICD-10-CM

## 2024-03-04 DIAGNOSIS — G8929 Other chronic pain: Secondary | ICD-10-CM | POA: Diagnosis not present

## 2024-03-05 ENCOUNTER — Ambulatory Visit

## 2024-03-05 ENCOUNTER — Telehealth: Payer: Self-pay

## 2024-03-05 LAB — CBC WITH DIFFERENTIAL/PLATELET
Basophils Absolute: 0 x10E3/uL (ref 0.0–0.2)
Basos: 1 %
EOS (ABSOLUTE): 0.1 x10E3/uL (ref 0.0–0.4)
Eos: 1 %
Hematocrit: 30.5 % — ABNORMAL LOW (ref 34.0–46.6)
Hemoglobin: 9.1 g/dL — ABNORMAL LOW (ref 11.1–15.9)
Immature Grans (Abs): 0 x10E3/uL (ref 0.0–0.1)
Immature Granulocytes: 0 %
Lymphocytes Absolute: 1.9 x10E3/uL (ref 0.7–3.1)
Lymphs: 25 %
MCH: 26.3 pg — ABNORMAL LOW (ref 26.6–33.0)
MCHC: 29.8 g/dL — ABNORMAL LOW (ref 31.5–35.7)
MCV: 88 fL (ref 79–97)
Monocytes Absolute: 0.5 x10E3/uL (ref 0.1–0.9)
Monocytes: 7 %
Neutrophils Absolute: 4.9 x10E3/uL (ref 1.4–7.0)
Neutrophils: 66 %
Platelets: 297 x10E3/uL (ref 150–450)
RBC: 3.46 x10E6/uL — ABNORMAL LOW (ref 3.77–5.28)
RDW: 17.2 % — ABNORMAL HIGH (ref 11.7–15.4)
WBC: 7.5 x10E3/uL (ref 3.4–10.8)

## 2024-03-05 LAB — BASIC METABOLIC PANEL WITH GFR
BUN/Creatinine Ratio: 31 — ABNORMAL HIGH (ref 9–23)
BUN: 21 mg/dL (ref 6–24)
CO2: 21 mmol/L (ref 20–29)
Calcium: 9 mg/dL (ref 8.7–10.2)
Chloride: 102 mmol/L (ref 96–106)
Creatinine, Ser: 0.67 mg/dL (ref 0.57–1.00)
Glucose: 86 mg/dL (ref 70–99)
Potassium: 4.4 mmol/L (ref 3.5–5.2)
Sodium: 138 mmol/L (ref 134–144)
eGFR: 110 mL/min/1.73 (ref >59)

## 2024-03-05 LAB — TSH+FREE T4
Free T4: 1.16 ng/dL (ref 0.82–1.77)
TSH: 0.582 u[IU]/mL (ref 0.450–4.500)

## 2024-03-05 LAB — RHEUMATOID FACTOR: Rheumatoid fact SerPl-aCnc: 67.8 [IU]/mL — ABNORMAL HIGH (ref <14.0)

## 2024-03-05 LAB — MAGNESIUM: Magnesium: 2.2 mg/dL (ref 1.6–2.3)

## 2024-03-05 NOTE — Telephone Encounter (Signed)
 LVM regarding missed appt. Requested call back to schedule nxt visit. - MJ

## 2024-03-09 ENCOUNTER — Telehealth: Payer: Self-pay

## 2024-03-09 ENCOUNTER — Encounter: Payer: Self-pay | Admitting: Family Medicine

## 2024-03-09 ENCOUNTER — Ambulatory Visit: Payer: Self-pay | Admitting: Student

## 2024-03-09 DIAGNOSIS — D500 Iron deficiency anemia secondary to blood loss (chronic): Secondary | ICD-10-CM

## 2024-03-09 DIAGNOSIS — R252 Cramp and spasm: Secondary | ICD-10-CM

## 2024-03-09 NOTE — Telephone Encounter (Signed)
 Patient calls nurse line requesting recent test results.   She reports she viewed them on mychart, however no one has reached out to her.   She requests you send her a Clinical cytogeneticist message detailing results.   Will forward to provider who saw patient.

## 2024-03-09 NOTE — Telephone Encounter (Signed)
-----   Message from Gladis Church sent at 03/09/2024  9:42 AM EDT ----- Regarding: LAB appt Hey team,  Patient needs a blood draw for anemia panel. Just a lab visit.  Thanks!

## 2024-03-09 NOTE — Addendum Note (Signed)
 Addended by: HOWELL LUNGER on: 03/09/2024 12:22 PM   Modules accepted: Orders

## 2024-03-09 NOTE — Telephone Encounter (Signed)
 Left a voicemail for the patient to give us  a call back so we can schedule her lab visit.

## 2024-03-10 ENCOUNTER — Other Ambulatory Visit

## 2024-03-10 ENCOUNTER — Other Ambulatory Visit: Payer: Self-pay

## 2024-03-10 DIAGNOSIS — D500 Iron deficiency anemia secondary to blood loss (chronic): Secondary | ICD-10-CM

## 2024-03-10 DIAGNOSIS — R252 Cramp and spasm: Secondary | ICD-10-CM

## 2024-03-10 MED ORDER — PREDNISONE 10 MG PO TABS: ORAL_TABLET | ORAL | 0 refills | Status: DC

## 2024-03-11 ENCOUNTER — Other Ambulatory Visit: Payer: Self-pay

## 2024-03-11 ENCOUNTER — Telehealth: Payer: Self-pay | Admitting: Family Medicine

## 2024-03-11 ENCOUNTER — Encounter (HOSPITAL_COMMUNITY): Payer: Self-pay

## 2024-03-11 ENCOUNTER — Inpatient Hospital Stay (HOSPITAL_COMMUNITY)
Admission: EM | Admit: 2024-03-11 | Discharge: 2024-03-14 | DRG: 348 | Disposition: A | Discharge status: Home / Self Care | Attending: Family Medicine | Admitting: Family Medicine

## 2024-03-11 ENCOUNTER — Ambulatory Visit

## 2024-03-11 DIAGNOSIS — Z01818 Encounter for other preprocedural examination: Secondary | ICD-10-CM

## 2024-03-11 DIAGNOSIS — K644 Residual hemorrhoidal skin tags: Secondary | ICD-10-CM | POA: Diagnosis present

## 2024-03-11 DIAGNOSIS — D500 Iron deficiency anemia secondary to blood loss (chronic): Secondary | ICD-10-CM | POA: Diagnosis not present

## 2024-03-11 DIAGNOSIS — Z91148 Patient's other noncompliance with medication regimen for other reason: Secondary | ICD-10-CM

## 2024-03-11 DIAGNOSIS — A630 Anogenital (venereal) warts: Secondary | ICD-10-CM | POA: Diagnosis not present

## 2024-03-11 DIAGNOSIS — K625 Hemorrhage of anus and rectum: Secondary | ICD-10-CM | POA: Diagnosis not present

## 2024-03-11 DIAGNOSIS — G8918 Other acute postprocedural pain: Secondary | ICD-10-CM | POA: Diagnosis not present

## 2024-03-11 DIAGNOSIS — Z881 Allergy status to other antibiotic agents status: Secondary | ICD-10-CM

## 2024-03-11 DIAGNOSIS — Z8249 Family history of ischemic heart disease and other diseases of the circulatory system: Secondary | ICD-10-CM | POA: Diagnosis not present

## 2024-03-11 DIAGNOSIS — Z79899 Other long term (current) drug therapy: Secondary | ICD-10-CM

## 2024-03-11 DIAGNOSIS — D62 Acute posthemorrhagic anemia: Secondary | ICD-10-CM | POA: Diagnosis present

## 2024-03-11 DIAGNOSIS — K642 Third degree hemorrhoids: Secondary | ICD-10-CM | POA: Diagnosis not present

## 2024-03-11 DIAGNOSIS — Z833 Family history of diabetes mellitus: Secondary | ICD-10-CM | POA: Diagnosis not present

## 2024-03-11 DIAGNOSIS — I10 Essential (primary) hypertension: Secondary | ICD-10-CM | POA: Diagnosis present

## 2024-03-11 DIAGNOSIS — K5909 Other constipation: Secondary | ICD-10-CM | POA: Diagnosis not present

## 2024-03-11 DIAGNOSIS — Z789 Other specified health status: Secondary | ICD-10-CM | POA: Diagnosis not present

## 2024-03-11 DIAGNOSIS — Z87891 Personal history of nicotine dependence: Secondary | ICD-10-CM | POA: Diagnosis not present

## 2024-03-11 DIAGNOSIS — D649 Anemia, unspecified: Principal | ICD-10-CM | POA: Diagnosis present

## 2024-03-11 DIAGNOSIS — R0602 Shortness of breath: Secondary | ICD-10-CM | POA: Diagnosis not present

## 2024-03-11 LAB — COMPREHENSIVE METABOLIC PANEL WITH GFR
ALT: 24 U/L (ref 0–44)
AST: 27 U/L (ref 15–41)
Albumin: 3.3 g/dL — ABNORMAL LOW (ref 3.5–5.0)
Alkaline Phosphatase: 54 U/L (ref 38–126)
Anion gap: 10 (ref 5–15)
BUN: 13 mg/dL (ref 6–20)
CO2: 25 mmol/L (ref 22–32)
Calcium: 8.6 mg/dL — ABNORMAL LOW (ref 8.9–10.3)
Chloride: 105 mmol/L (ref 98–111)
Creatinine, Ser: 0.64 mg/dL (ref 0.44–1.00)
GFR, Estimated: >60 mL/min (ref >60)
Glucose, Bld: 93 mg/dL (ref 70–99)
Potassium: 3.8 mmol/L (ref 3.5–5.1)
Sodium: 140 mmol/L (ref 135–145)
Total Bilirubin: 0.3 mg/dL (ref 0.0–1.2)
Total Protein: 5.8 g/dL — ABNORMAL LOW (ref 6.5–8.1)

## 2024-03-11 LAB — URINALYSIS, ROUTINE W REFLEX MICROSCOPIC
Bilirubin Urine: NEGATIVE
Glucose, UA: NEGATIVE mg/dL
Hgb urine dipstick: NEGATIVE
Ketones, ur: NEGATIVE mg/dL
Leukocytes,Ua: NEGATIVE
Nitrite: NEGATIVE
Protein, ur: NEGATIVE mg/dL
Specific Gravity, Urine: 1.015 (ref 1.005–1.030)
pH: 5 (ref 5.0–8.0)

## 2024-03-11 LAB — CBC
HCT: 16.8 % — ABNORMAL LOW (ref 36.0–46.0)
Hemoglobin: 5.1 g/dL — CL (ref 12.0–15.0)
MCH: 26.3 pg (ref 26.0–34.0)
MCHC: 30.4 g/dL (ref 30.0–36.0)
MCV: 86.6 fL (ref 80.0–100.0)
Platelets: 273 K/uL (ref 150–400)
RBC: 1.94 MIL/uL — ABNORMAL LOW (ref 3.87–5.11)
RDW: 18.9 % — ABNORMAL HIGH (ref 11.5–15.5)
WBC: 6.9 K/uL (ref 4.0–10.5)
nRBC: 0 % (ref 0.0–0.2)

## 2024-03-11 LAB — PREPARE RBC (CROSSMATCH)

## 2024-03-11 LAB — POC OCCULT BLOOD, ED: Fecal Occult Bld: POSITIVE — AB

## 2024-03-11 LAB — ANEMIA PANEL
Ferritin: 4 ng/mL — ABNORMAL LOW (ref 15–150)
Folate, Hemolysate: 247 ng/mL
Folate, RBC: 1395 ng/mL (ref >498)
Hematocrit: 17.7 % — CL (ref 34.0–46.6)
Iron Saturation: 2 % — CL (ref 15–55)
Iron: 9 ug/dL — CL (ref 27–159)
Retic Ct Pct: 2.3 % (ref 0.6–2.6)
Total Iron Binding Capacity: 389 ug/dL (ref 250–450)
UIBC: 380 ug/dL (ref 131–425)
Vitamin B-12: 214 pg/mL — ABNORMAL LOW (ref 232–1245)

## 2024-03-11 LAB — HCG, SERUM, QUALITATIVE: Preg, Serum: NEGATIVE

## 2024-03-11 LAB — ANA: Anti Nuclear Antibody (ANA): NEGATIVE

## 2024-03-11 LAB — C-REACTIVE PROTEIN: CRP: <1 mg/L (ref 0–10)

## 2024-03-11 MED ORDER — ACETAMINOPHEN 500 MG PO TABS
1000.0000 mg | ORAL_TABLET | Freq: Once | ORAL | Status: AC
  Administered 2024-03-11: 1000 mg via ORAL
  Filled 2024-03-11: qty 2

## 2024-03-11 MED ORDER — SODIUM CHLORIDE 0.9% IV SOLUTION
Freq: Once | INTRAVENOUS | Status: AC
Start: 2024-03-11 — End: 2024-03-12

## 2024-03-11 NOTE — Progress Notes (Deleted)
   SUBJECTIVE:   CHIEF COMPLAINT / HPI:   ***  PERTINENT  PMH / PSH: ***  OBJECTIVE:   There were no vitals taken for this visit. ***  General: NAD, pleasant, able to participate in exam Cardiac: RRR, no murmurs. Respiratory: CTAB, normal effort, No wheezes, rales or rhonchi Abdomen: Bowel sounds present, nontender, nondistended Extremities: no edema or cyanosis. Skin: warm and dry, no rashes noted Neuro: alert, no obvious focal deficits Psych: Normal affect and mood  ASSESSMENT/PLAN:   No problem-specific Assessment & Plan notes found for this encounter.     Dr. Izetta Nap, DO [facility]    {    This will disappear when note is signed, click to select method of visit    :1}

## 2024-03-11 NOTE — Telephone Encounter (Signed)
 Lab [employer] with a critical lab value.   Hemoglobin 5.3.   Called patient and advised to go to the emergency department after speaking with Dr. Theophilus.  Patient agreed with plan. Will cancel this mornings ATC apt.

## 2024-03-11 NOTE — Plan of Care (Signed)
 FMTS Brief Progress Note  S: Patient continues to report fatigue and not feeling well. She also reports a headache along the forehead that began shortly after initiating transfusion of first unit. Patient states she often has HA for which she takes ibuprofen  with improvement. Endorses blurry vision and dizziness upon standing. States she can feel her pulse in her ears at times. Denies nausea. She did receive tylenol  1000mg  shortly before we arrived to examine her. 2nd unit transfusing now.   O: BP 133/86   Pulse 77   Temp 98.3 F (36.8 C) (Oral)   Resp 16   Ht 5' 4 (1.626 m)   Wt 85.7 kg   SpO2 100%   BMI 32.44 kg/m    General: Lying uncomfortably in hospital bed HEENT: PERRL Cardiovascular: RRR, S1/S2 normal. No murmurs, rubs, or gallops appreciated. Pulmonary: Normal work of breathing. CTAB with no wheezes or crackles present Abdomen: Soft, non-tender, non-distended. Neurologic: No focal deficits.    A/P: Anemia Currently receiving 2nd unit PRBC. Anticipate patient feeling much better after. Will continue to follow. - GI consulted by ED provider, appreciate recs - Surgery consult per GI for hemorrhoidectomy - f/u post transfusion H&H - IVF 0.9% saline - AM CBC - Orders reviewed. Labs for AM ordered, which was adjusted as needed.   Headache Benign exam with normal BP. Likely symptomatic anemia.Patient states she has a hx of HA that usually improves with ibuprofen . Will consider if no improvement with Tylenol . Will reevaluate as necessary.  Jerrie Gathers, DO 03/11/2024, 9:14 PM PGY-1, [facility] Night Resident  Please page [phone] with questions.

## 2024-03-11 NOTE — Telephone Encounter (Signed)
 Received page for critical lab value for this patient from [facility]. Called provided [employer] number. Critical lab of hematocrit of 17.7. Per chart review previous hematocrit on 08/22 was 30.5.  Called patient.  Discussed critical lab value of hematocrit. Explained that this indicates that her blood levels have been dropping fairly rapidly to have changed this much in 1 week. She does endorse a history of fibroids. She also notes that she has had persistent hemorrhoids that bleed with almost every bowel movement. This is worsened with an the past couple weeks. She noted that she has also had significantly worse fatigue over the past week that was much worse yesterday. She endorses some lightheaded and dizziness but none acutely right now.  I recommended that she go to the emergency room to recheck her hemoglobin and hematocrit.  She may require a blood transfusion. Patient verbalized understanding of this recommendation however she declined going to the emergency room at this time due to possible long wait.    I offered an appointment in our clinic for assessment and recheck of her labs to which she was agreeable.  Will work into [employer] fund[employer] triage slot at 1110.  I discussed further symptoms of anemia or persistent bleeding from her hemorrhoids for her to call 911 and go to the emergency room.  She verbalized understanding of this.

## 2024-03-11 NOTE — Assessment & Plan Note (Addendum)
 Hypertension - patient reportedly not taking blood pressure medications Menorrhagia - s/p uterine ablation June 2025

## 2024-03-11 NOTE — ED Provider Notes (Signed)
 [place] EMERGENCY DEPARTMENT AT [facility] Provider Note   CSN: [id number] Arrival date & time: 03/11/24  1136     Patient presents with: Hgb low   [name] is a 43 y.o. female.   43 year old female presenting with abnormal labs. She was contacted by her primary care provider and instructed to come to the emergency department due to her hemoglobin of 5.3.  She tells me that she has hemorrhoids that bleed a lot, every time she has a bowel movement she describes a stream of blood dripping into the toilet, she also describes pain with bowel movements secondary to her hemorrhoids.  She underwent a colonoscopy approximately 1 year ago, she was told that she has external and internal hemorrhoids but no evidence of polyps in her colon.  She has been told in the past that she may benefit from surgery for her hemorrhoids, she tells me that a previous provider attempted to band an internal hemorrhoid but this was unsuccessful.  She also has a history of heavy vaginal bleeding, however she underwent an endometrial ablation on June 6 and has not had any vaginal bleeding since that time.  She has required an IV iron transfusion in the past, as well as a blood transfusion following a C-section approximately 10 years ago.  She endorses approximately 2 weeks of shortness of breath as well as lightheadedness, denies syncope.        Prior to Admission medications   Medication Sig Start Date End Date Taking? Authorizing Provider  AMBULATORY NON FORMULARY MEDICATION Diltiazem gel with 2% lidocaine   Apply a pea sized amount into your rectum three times daily until healed Dispense 30 GM zero refill 10/20/23   Armbruster, Elspeth SQUIBB, MD  amLODipine  (NORVASC ) 5 MG tablet Take 1 tablet (5 mg total) by mouth at bedtime. 12/04/23   Joshua Domino, DO  Azelastine HCl 137 MCG/SPRAY SOLN Place 2 sprays into the nose daily as needed. 10/10/22   [provider]  diclofenac  (VOLTAREN ) 75 MG EC  tablet Take 1 tablet (75 mg total) by mouth 2 (two) times daily. 02/01/24   Hudnall, Ludie SAUNDERS, MD  ferrous sulfate  325 (65 FE) MG tablet Take 1 tablet (325 mg total) by mouth every other day. 12/04/23   Joshua Domino, DO  meloxicam  (MOBIC ) 15 MG tablet Take one pill a day with food for 7 days and then prn thereafter 02/04/24   Hudnall, Ludie SAUNDERS, MD  naproxen  (NAPROSYN ) 500 MG tablet Take 1 tablet (500 mg total) by mouth 2 (two) times daily as needed. Take with food. 02/02/24   Hudnall, Ludie SAUNDERS, MD  omeprazole  (PRILOSEC) 20 MG capsule Take 1 capsule (20 mg total) by mouth daily. 03/30/23   Armbruster, Elspeth SQUIBB, MD  ondansetron  (ZOFRAN ) 4 MG tablet Take 1 tablet (4 mg total) by mouth every 8 (eight) hours as needed for nausea or vomiting. 03/01/24   Howell Lunger, DO  predniSONE  (DELTASONE ) 10 MG tablet 6 tabs by mouth day 1, 5 tabs by mouth day 2, 4 tabs by mouth day 3, 3 tabs by mouth day 4, 2 tabs by mouth day 5, 1 tab by mouth day 6 03/10/24   Hudnall, Ludie SAUNDERS, MD  Relugolix-Estradiol -Norethind (MYFEMBREE ) 40-1-0.5 MG TABS Take 1 tablet by mouth daily. 11/10/23   Dallie Vera GAILS, MD  Wheat Dextrin [facility]) POWD Use as directed, daily 10/20/23   Armbruster, Elspeth SQUIBB, MD    Allergies: Levofloxacin    Review of Systems  Updated Vital Signs  Vitals:   03/11/24 1158 03/11/24 1330 03/11/24 1400 03/11/24 1439  BP:  117/63 111/64   Pulse:  81 80   Resp:  (!) 24 (!) 26   Temp:    98.8 F (37.1 C)  TempSrc:    Oral  SpO2:  100% 100%   Weight: 85.7 kg     Height: 5' 4 (1.626 m)        Physical Exam Vitals and nursing note reviewed.  HENT:     Head: Normocephalic.  Eyes:     Extraocular Movements: Extraocular movements intact.     Pupils: Pupils are equal, round, and reactive to light.  Cardiovascular:     Rate and Rhythm: Normal rate and regular rhythm.     Heart sounds: Normal heart sounds.  Pulmonary:     Effort: Pulmonary effort is normal.     Breath sounds: Normal breath sounds.   Abdominal:     Palpations: Abdomen is soft.     Tenderness: There is no abdominal tenderness. There is no guarding.  Genitourinary:    Comments: DRE performed with nurse Jasmine at the bedside as chaperone  Multiple external hemorrhoids noted, obscuring view of the anus.  No obvious thrombosed hemorrhoid.  DRE was limited secondary to pain, however no frank blood was visualized. Musculoskeletal:     Cervical back: Normal range of motion.     Comments: Moves all extremity spontaneously without difficulty  Skin:    General: Skin is warm and dry.  Neurological:     Mental Status: She is alert and oriented to person, place, and time.     (all labs ordered are listed, but only abnormal results are displayed) Labs Reviewed  COMPREHENSIVE METABOLIC PANEL WITH GFR - Abnormal; Notable for the following components:      Result Value   Calcium 8.6 (*)    Total Protein 5.8 (*)    Albumin 3.3 (*)    All other components within normal limits  CBC - Abnormal; Notable for the following components:   RBC 1.94 (*)    Hemoglobin 5.1 (*)    HCT 16.8 (*)    RDW 18.9 (*)    All other components within normal limits  POC OCCULT BLOOD, ED - Abnormal; Notable for the following components:   Fecal Occult Bld POSITIVE (*)    All other components within normal limits  URINALYSIS, ROUTINE W REFLEX MICROSCOPIC  HCG, SERUM, QUALITATIVE  CBG MONITORING, ED  TYPE AND SCREEN  PREPARE RBC (CROSSMATCH)    EKG: None  Radiology: No results found.   .Critical Care  Performed by: Glendia Rocky SAILOR, PA-C Authorized by: Glendia Rocky SAILOR, PA-C   Critical care provider statement:    Critical care time (minutes):  36   Critical care time was exclusive of:  Separately billable procedures and treating other patients and teaching time   Critical care was necessary to treat or prevent imminent or life-threatening deterioration of the following conditions:  Circulatory failure (hgb 5.1 requiring 2U PRBC  transfusion)   Critical care was time spent personally by me on the following activities:  Ordering and performing treatments and interventions, development of treatment plan with patient or surrogate, ordering and review of laboratory studies, discussions with consultants, evaluation of patient's response to treatment, re-evaluation of patient's condition, examination of patient and review of old charts   I assumed direction of critical care for this patient from another provider in my specialty: no  Care discussed with: admitting provider      Medications Ordered in the ED  0.9 %  sodium chloride  infusion (Manually program via Guardrails IV Fluids) (has no administration in time range)    Clinical Course as of 03/11/24 1441  Fri Mar 11, 2024  1350 This is a 43 year old female presenting to ED with concern for symptomatic anemia.  Patient ports dizziness, lightheadedness and shortness of breath ongoing for 2 weeks.  She has chronic issues with bright red blood in her stool due to large hemorrhoids.  She reports she has failed rubber banding therapy as an outpatient for this.  Her PCP noted hemoglobin was low and sent her into the ED.  She has conjunctival pallor mild tachycardia on exam.  Very large protruding hemorrhoids that are tender to palpation.  Hemoglobin is 5.1.  Patient will need blood transfusion and medical admission. [MT]    Clinical Course User Index [MT] Cottie Donnice PARAS, MD                                 Medical Decision Making This patient presents to the ED for concern of low hemoglobin, this involves an extensive number of treatment options, and is a complaint that carries with it a high risk of complications and morbidity.  The differential diagnosis includes anemia secondary to blood loss from a GI source, anemia secondary to blood loss from vaginal source, anemia secondary to iron deficiency, anemia secondary to poor oral intake, anemia secondary to other vitamin  deficiency   Co morbidities that complicate the patient evaluation  History of external and internal hemorrhoids, history of anal fissure, history of heavy vaginal bleeding   Additional history obtained:  Additional history obtained from record review External records from outside source obtained and reviewed including recent PCP note including results of recent lab work   Lab Tests:  I Ordered, and personally interpreted labs.  The pertinent results include: Urinalysis within normal limits.  CBC notable for hemoglobin of 5.1, this is severely diminished as compared to 7 days ago when it was found to be 9.1.  Hemoccult positive.  CMP notable for mild hypocalcemia with decrease in total protein/albumin, otherwise unremarkable.  Serum hCG negative.   Cardiac Monitoring: / EKG:  The patient was maintained on a cardiac monitor.  I personally viewed and interpreted the cardiac monitored which showed an underlying rhythm of: NSR   Consultations Obtained:  I requested consultation with the gastroenterology and hospitalist,  and discussed lab and imaging findings as well as pertinent plan - they recommend: I spoke with Amy Esterwood PA-C with [facility], they plan to assess this patient at some point during her hospitalization.  I spoke with Dr. Idelle with the hospitalist service who agrees that this patient is appropriate for admission given the level of anemia secondary to blood loss requiring transfusion.   Problem List / ED Course / Critical interventions / Medication management  I ordered medication including 2 units of PRBCs for transfusion I have reviewed the patients home medicines and have made adjustments as needed   Social Determinants of Health:  Former tobacco use   Test / Admission - Considered:  Physical exam notable as above.  Patient has history of iron deficiency anemia requiring iron and blood transfusions in the past, I believe that her anemia today is secondary  to blood loss from hemorrhoids.  Her hemoglobin was found to be 9.1  on 8/22, she was contacted by her primary care provider today and instructed to come to the emergency department due to a hemoglobin of 5.3.  Upon recheck, hemoglobin here is 5.1.  I discussed the risk/benefits/alternatives of blood transfusion in depth with the patient, she voiced understanding and is in agreement to proceed with blood transfusion.  I spoke with GI as well as the hospitalist service as above, this patient is appropriate for admission for blood transfusion and reassessment of her hemoglobin.    Amount and/or Complexity of Data Reviewed Labs: ordered.  Risk Prescription drug management. Decision regarding hospitalization.        Final diagnoses:  Anemia requiring transfusions  External hemorrhoids    ED Discharge Orders     None          Glendia Rocky SAILOR, [place] 03/11/24 1442    Cottie Donnice PARAS, MD 03/11/24 [id number]

## 2024-03-11 NOTE — Consult Note (Signed)
 [facility] Progress Note  Chief Complaint: Rectal bleeding and acute on chronic GI blood loss anemia  History:  43 year old woman known to my partner Dr. Leigh from the outpatient setting with a history of chronic iron deficiency anemia for which she underwent endoscopic evaluation (EGD and colonoscopy) in September 2024.  She has chronic severe hemorrhoidal bleeding and had 2 prior hemorrhoidal banding procedures done earlier this year.  According to Dr. Hassan most recent note and speaking with the patient today, neither of them led to any reduction in her bleeding.  She was going to have a third banding at the most recent office visit with Dr. Leigh also found an anal fissure so banding was deferred.  Discussions were had at that time about possible referral to colorectal surgery.  She struggles with chronic constipation and says that if she takes MiraLAX  twice a daily and Dulcolax daily she will have a bowel movement daily but sometimes it is firm.  She has anorectal pain with bowel movements and says the area is sensitive.  The hemorrhoidal bleeding has been escalating in the last few weeks with blood pouring into the toilet with every bowel movement.  She has become progressively fatigued and weak and had some lab work done today with hemoglobin of 5.3 leading to hospitalization.  ROS: Cardiovascular: No chest pain Respiratory: No dyspnea Urinary: No dysuria Patient previously had menorrhagia contributing to her anemia prior to a gynecologic procedure Objective:  No current facility-administered medications for this encounter.   Her home medications are reviewed and also noted in the admission history and physical  Vital signs in last 24 hrs: Vitals:   03/11/24 1633 03/11/24 1634  BP: (!) 113/55   Pulse: 81   Resp: 13   Temp:  98.7 F (37.1 C)  SpO2: 100%     Intake/Output Summary (Last 24 hours) at 03/11/2024 1746 Last data filed at 03/11/2024 1727 Gross per  24 hour  Intake 120 ml  Output --  Net 120 ml     Physical Exam Laying comfortably in bed, conversational, anxious and quite concerned about her current situation Her nurse Waddell was present for my entire exam and subsequent discussion with patient. HEENT: sclera anicteric, oral mucosa without lesions Neck: supple, no thyromegaly, JVD or lymphadenopathy Cardiac: RRR without murmurs, S1S2 heard, no peripheral edema Pulm: clear to auscultation bilaterally, normal RR and effort noted Abdomen: soft, no tenderness, with active bowel sounds. No guarding or palpable hepatosplenomegaly Skin; warm and dry, no jaundice Perianal exam reveals some redundant anal tissue, no thrombosed external hemorrhoids seen. DRE-she is sensitive to the exam but no fissure is found and no palpable internal lesions.  There is scant blood on the glove Recent Labs:     Latest Ref Rng & Units 03/11/2024   12:12 PM 03/10/2024    4:16 PM 03/04/2024   12:17 PM  CBC  WBC 4.0 - 10.5 K/uL 6.9   7.5   Hemoglobin 12.0 - 15.0 g/dL 5.1  5.3  9.1   Hematocrit 36.0 - 46.0 % 16.8  17.7  30.5   Platelets 150 - 400 K/uL 273   297     No results for input(s): INR in the last 168 hours.    Latest Ref Rng & Units 03/11/2024   12:12 PM 03/04/2024   12:17 PM 12/18/2023    9:44 AM  CMP  Glucose 70 - 99 mg/dL 93  86  89   BUN 6 - 20 mg/dL 13  21  16   Creatinine 0.44 - 1.00 mg/dL 9.35  9.32  9.49   Sodium 135 - 145 mmol/L 140  138  140   Potassium 3.5 - 5.1 mmol/L 3.8  4.4  3.6   Chloride 98 - 111 mmol/L 105  102  108   CO2 22 - 32 mmol/L 25  21    Calcium 8.9 - 10.3 mg/dL 8.6  9.0    Total Protein 6.5 - 8.1 g/dL 5.8     Total Bilirubin 0.0 - 1.2 mg/dL 0.3     Alkaline Phos 38 - 126 U/L 54     AST 15 - 41 U/L 27     ALT 0 - 44 U/L 24        Radiologic studies:   Assessment & Plan  Assessment: Iron deficiency anemia of chronic GI blood loss.  There was previously a component of gynecologic blood loss treated with a  gynecologic procedure  Rectal bleeding from known internal hemorrhoids, bleeding has been escalating in recent weeks and has led to severe acute on chronic anemia.  Chronic constipation that is generally under good management with the regimen noted above  Currently receiving first of 2 units planned PRBCs, then she needs serial hemoglobin and hematocrit to determine if further transfusion needed.  This patient needs a surgical consult while hospitalized to evaluate for hemorrhoidectomy. Need for any repeat endoscopic evaluation such as sigmoidoscopy TBD surgical consultation  Dr. Leigh will be updated, and Dr. Rollin will be available as needed over the weekend covering our service.   Victory LITTIE Brand III Office: [phone]

## 2024-03-11 NOTE — H&P (Addendum)
 Hospital Admission History and Physical Service Pager: [phone]  Patient name: [name] Medical record number: [id number] Date of Birth: 08/22/1980 Age: 43 y.o. Gender: female  Primary Care Provider: Pcp, No Consultants: GI Code Status: FULL Preferred Emergency Contact:  Contact Information     Name Relation Home Work Mobile   Colebank,Norman Father [phone]  [phone]      Other Contacts   None on File     Chief Complaint: Abnormal labs  Assessment and Plan: [name] is a 43 y.o. female presenting with anemia. Differential for presentation of this includes GI bleed, iron deficiency anemia or anemia from vitamin deficiency.   Patient has been reporting copious amounts of bright red blood per rectum for several weeks making lower GI bleed secondary to large hemorrhoids highly suspicious. She is also very iron deficient according to her iron studies on 8/28 that were drawn outpatient.  Low suspicion for anemia secondary to vitamin deficiency such as B12 given normal MCV result and no history of alcohol abuse or poor intake. Assessment & Plan Anemia Hgb on admission 5.1, down from 9.1 the week prior likely 2/2 significant bleeding from hemorrhoids.  Patient is currently hemodynamically stable and receiving 2UPRBC ordered by EDP.  - Admit to progressive with FPTS, attending Dr. Rosalynn - GI consulted by ED provider, planning to see - f/u post transfusion H&H - IVF 0.9% saline - AM CBC Chronic health problem Hypertension - patient reportedly not taking blood pressure medications Menorrhagia - s/p uterine ablation June 2025  Pt states the only outpatient medication she is taking is Meloxicam .   FEN/GI: regular VTE Prophylaxis: SCDs  Disposition: Pending clinical improvement  History of Present Illness:  [name] is a 43 y.o. female presenting with abnormal labs, she was told to come to the emergency department by her PCP due to hemoglobin of 5.3. She states  she has been fatigued for a while and worsened 2 weeks ago. She was seen in our clinic last week on 8/19 for muscle cramps, and her labwork from that time showed severe iron deficiency. She reports profuse bleeding from her hemorrhoids, and states that her hemorrhoids have worsened this year (specifically over the last few weeks). Patient states that she has been passing blood clots from the hemorrhoids and it pours out like urine for the last month when she uses the bathroom. She has tried banding of her hemorrhoids in the past and it did not work. States hemorrhoids are very painful. Patient denies vaginal bleeding since recent endometrial ablation. She also reports headache.    In the ED, her hemoglobin levels were rechecked and found to be 5.1 at time of admission (9 one week ago). 2U PRBC were ordered. [facility] (Amy Esterwood) was consulted in the ED and they plan to see and scope pt per EDP.   Review Of Systems: Per HPI with the following additions: none  Pertinent Past Medical History: Internal and external hemorrhoids Anal fissure Menorrhagia GERD HTN IDA - iron infusions x2 07/2023 and x2 02/2023  Remainder reviewed in history tab.   Pertinent Past Surgical History: Colonoscopy 1 year ago -> internal and external hemorrhoids, no polyps Hysteroscopy with myomectomy 2025-> for heavy uterine bleeding C-section 2001, 2009, 2016 (BTL) Cholecystectomy 2002 Hernia repair with mesh 2017  Remainder reviewed in history tab.   Pertinent Social History: Tobacco use: Former 8.7 years Alcohol use: No Other Substance use: No  Pertinent Family History: none Remainder reviewed in history  tab.   Important Outpatient Medications: Mobic  15 mg daily Pt denies taking the following from her MAR: - Amlodipine  10mg  daily - Azelastine PRN - Ferrous sulfate  325mg  every other day - Prilosec 20mg  daily - Myfembree   Remainder reviewed in medication history.   Objective: BP 111/64   Pulse  80   Temp 98.1 F (36.7 C)   Resp (!) 26   Ht 5' 4 (1.626 m)   Wt 85.7 kg   SpO2 100%   BMI 32.44 kg/m  Exam: General: Laying in hospital bed, NAD Eyes: EOMI Cardiovascular: Regular rate and rhythm, no r/m/g Respiratory: CTAB, normal work of breathing on room air Gastrointestinal: Soft, nontender, nondistended Psych: Tearful on interview, frustrated with pain from hemorrhoids  Labs:  CBC BMET  Recent Labs  Lab 03/11/24 1212  WBC 6.9  HGB 5.1*  HCT 16.8*  PLT 273   Recent Labs  Lab 03/11/24 1212  NA 140  K 3.8  CL 105  CO2 25  BUN 13  CREATININE 0.64  GLUCOSE 93  CALCIUM 8.6*     EKG: Normal sinus rhythm at 88BPM. Qtc . No STT changes.   Idelle Nakai, DO 03/11/2024, 2:35 PM PGY-1, [facility]  FPTS Intern pager: [phone], text pages welcome Secure chat group [facility] Teaching Service   Upper Level Addendum: I have seen and evaluated this patient along with Dr. Idelle and reviewed the above note, making necessary revisions as appropriate. I agree with the medical decision making and physical exam as noted above. Elyce Prescott, DO PGY-2 [facility] Residency

## 2024-03-11 NOTE — Hospital Course (Addendum)
 Ashantia S Gronewold is a 43 y.o.female with a history of severe internal and external hemorrhoids who was admitted to the [facility] Medicine Teaching Service at [facility] for anemia. Her hospital course is detailed below:  Acute on Chronic Anemia: Patient presented to the ED with a hemoglobin of 5.1.  Symptomatic with worsening fatigue and profuse bright red blood per rectum over the last few weeks.  She received 2 units of blood with improvement.  She is doing well postoperatively   Post-operative pain Patient d/o surgical pain OVN post-op. She states that Oxycodone  did not help improved pain. She responded well with Dilaudid  0.5-1 mg Q6HR PRN and Tylenol  1000 mg Q6H PRN. Recommending NSAID for additional pain control and Sitz bath Q2-3H  Other chronic conditions were medically managed with home medications and formulary alternatives as necessary   PCP Follow-up Recommendations: Continue oral iron, pt had not been taking meds at home Consider restarting HTN medication if necessary, was not taking meds at home History of constipation - continue bowel regimen to prevent recurrent hemorrhoid

## 2024-03-11 NOTE — ED Triage Notes (Signed)
 Pt states she was sent from PCP office d/t hgb 5.3. Pt states she has been bleeding from her hemorrhoids for years. Pt denies blood in vomit or Lititia Sen stools. Pt states she has had constipation and dizziness.

## 2024-03-11 NOTE — Telephone Encounter (Signed)
 Patient scheduled for this morning in ATC.  Attempted to call patient to confirm, however no answer.   If she calls back please let her know she is on the schedule for 11:10a this morning.

## 2024-03-11 NOTE — ED Notes (Signed)
 IP RN requested this writer wait prior to transporting the patient.

## 2024-03-11 NOTE — Assessment & Plan Note (Addendum)
 Hgb on admission 5.1, down from 9.1 the week prior likely 2/2 significant bleeding from hemorrhoids.  Patient is currently hemodynamically stable and receiving 2UPRBC ordered by EDP.  - Admit to progressive with FPTS, attending Dr. Rosalynn - GI consulted by ED provider, planning to see - f/u post transfusion H&H - IVF 0.9% saline - AM CBC

## 2024-03-12 DIAGNOSIS — D649 Anemia, unspecified: Secondary | ICD-10-CM | POA: Diagnosis not present

## 2024-03-12 DIAGNOSIS — D62 Acute posthemorrhagic anemia: Secondary | ICD-10-CM | POA: Diagnosis not present

## 2024-03-12 DIAGNOSIS — Z789 Other specified health status: Secondary | ICD-10-CM | POA: Diagnosis not present

## 2024-03-12 DIAGNOSIS — K642 Third degree hemorrhoids: Secondary | ICD-10-CM | POA: Diagnosis not present

## 2024-03-12 LAB — TYPE AND SCREEN
ABO/RH(D): A POS
Antibody Screen: NEGATIVE
Unit division: 0
Unit division: 0

## 2024-03-12 LAB — BPAM RBC
Blood Product Expiration Date: 202509242359
Blood Product Expiration Date: 202509252359
ISSUE DATE / TIME: 202508291455
ISSUE DATE / TIME: 202508292018
Unit Type and Rh: 6200
Unit Type and Rh: 6200

## 2024-03-12 LAB — CBC
HCT: 22 % — ABNORMAL LOW (ref 36.0–46.0)
Hemoglobin: 7.1 g/dL — ABNORMAL LOW (ref 12.0–15.0)
MCH: 27.6 pg (ref 26.0–34.0)
MCHC: 32.3 g/dL (ref 30.0–36.0)
MCV: 85.6 fL (ref 80.0–100.0)
Platelets: 255 K/uL (ref 150–400)
RBC: 2.57 MIL/uL — ABNORMAL LOW (ref 3.87–5.11)
RDW: 16.7 % — ABNORMAL HIGH (ref 11.5–15.5)
WBC: 6.5 K/uL (ref 4.0–10.5)
nRBC: 0.3 % — ABNORMAL HIGH (ref 0.0–0.2)

## 2024-03-12 LAB — HEMOGLOBIN AND HEMATOCRIT, BLOOD
HCT: 21.7 % — ABNORMAL LOW (ref 36.0–46.0)
HCT: 23.2 % — ABNORMAL LOW (ref 36.0–46.0)
HCT: 23.9 % — ABNORMAL LOW (ref 36.0–46.0)
HCT: 24.4 % — ABNORMAL LOW (ref 36.0–46.0)
Hemoglobin: 7.1 g/dL — ABNORMAL LOW (ref 12.0–15.0)
Hemoglobin: 7.5 g/dL — ABNORMAL LOW (ref 12.0–15.0)
Hemoglobin: 7.7 g/dL — ABNORMAL LOW (ref 12.0–15.0)
Hemoglobin: 7.9 g/dL — ABNORMAL LOW (ref 12.0–15.0)

## 2024-03-12 LAB — HIV ANTIBODY (ROUTINE TESTING W REFLEX): HIV Screen 4th Generation wRfx: NONREACTIVE

## 2024-03-12 MED ORDER — ONABOTULINUMTOXINA 100 UNITS IJ SOLR
100.0000 [IU] | INTRAMUSCULAR | Status: AC
  Filled 2024-03-12: qty 100

## 2024-03-12 MED ORDER — CEFAZOLIN SODIUM-DEXTROSE 2-4 GM/100ML-% IV SOLN
2.0000 g | INTRAVENOUS | Status: DC
  Filled 2024-03-12: qty 100

## 2024-03-12 MED ORDER — ACETAMINOPHEN 325 MG PO TABS
650.0000 mg | ORAL_TABLET | Freq: Four times a day (QID) | ORAL | Status: DC | PRN
  Administered 2024-03-12: 650 mg via ORAL
  Filled 2024-03-12: qty 2

## 2024-03-12 NOTE — Consult Note (Signed)
 Reason for Consult: bleeding hemorrhoids Referring Physician: Stoney Clark GORMAN [name] is an 43 y.o. female.  HPI:  We are asked to consult due to severe bleeding hemorrhoids.  The patient has been having hemorrhoids for several years with recent increase in bleeding.  She did have a colonoscopy almost a year ago with no cancers or polyps seen.  The patient was also having vaginal bleeding, but this has been taking care of since April with endometrial ablation and a fibroid removal.  She no longer is having this.  She has had hemorrhoid banding several times.  Most recently when she was supposed to have another banding with Dr. Leigh, she was found to have an anal fissure and so this was abandoned.  She had just been having clots with bowel movements, but now has been having the sound of urine hitting the bowl when she has a bowel movement due to so much blood.  She has been having her hematocrit followed and previously had a hemoglobin of 9.  It was down to 5.1 on admission.  She felt like she was dizzy she was going to pass out and this is when she came to the emergency department.  Past Medical History:  Diagnosis Date   Abnormal uterine bleeding (AUB)    Anal fissure    GI--- dr leigh   Anxiety    Depression    GERD (gastroesophageal reflux disease)    EGD 03-30-2023 w/ gastritis   Heart murmur    Hemorrhoids    s/p  hemorrhoid banding ligation 10-08-2023 by dr leigh   History of trichomonal vaginitis 01/2023   Hypertension    Iron deficiency anemia due to chronic blood loss    treated w/ iron infusions;   01/ 2025 x2;   08/ 2024 x2   Seasonal allergic rhinitis    Uterine leiomyoma    Vitamin D  deficiency 11/13/2016    Past Surgical History:  Procedure Laterality Date   CESAREAN SECTION  04/22/2000   @[facility]  by dr b. nikki   CESAREAN SECTION WITH BILATERAL TUBAL LIGATION Bilateral 11/03/2014   Procedure: CESAREAN SECTION WITH BILATERAL TUBAL LIGATION;  Surgeon: Carlin DELENA Centers, MD;  Location: [facility];  Service: Obstetrics;  Laterality: Bilateral;   CHOLECYSTECTOMY, LAPAROSCOPIC  01/26/2001   @[facility]   by dr d. blackman   COLONOSCOPY WITH ESOPHAGOGASTRODUODENOSCOPY (EGD)  03/30/2023   dr leigh   HYSTEROSCOPY N/A 12/18/2023   Procedure: ABLATION, ENDOMETRIUM, HYSTEROSCOPIC;  Surgeon: Dallie Vera GAILS, MD;  Location: [facility] OR;  Service: Gynecology;  Laterality: N/A;  NOVASURE   HYSTEROSCOPY WITH MYOMECTOMY N/A 12/18/2023   Procedure: HYSTEROSCOPY WITH MYOSURE MYOMECTOMY;  Surgeon: Dallie Vera GAILS, MD;  Location: [facility] OR;  Service: Gynecology;  Laterality: N/A;   INCISIONAL HERNIA REPAIR N/A 03/27/2016   Procedure: LAPAROSCOPIC ASSISTED REPAIR OF INCARCERATED  INCISIONAL HERNIA;  Surgeon: Camellia Blush, MD;  Location: [facility];  Service: General;  Laterality: N/A;   INSERTION OF MESH N/A 03/27/2016   Procedure: INSERTION OF MESH;  Surgeon: Camellia Blush, MD;  Location: [facility];  Service: General;  Laterality: N/A;   REPEAT CESAREAN SECTION  07/10/2008   @[facility]   by dr c. centers    Family History  Problem Relation Age of Onset   Diabetes Father    Hypertension Father    Hyperlipidemia Father    Cancer Maternal Aunt        breast   Hypertension Paternal Grandmother    Stroke Paternal Grandmother    Stomach  cancer Neg Hx    Esophageal cancer Neg Hx    Colon cancer Neg Hx    Rectal cancer Neg Hx     Social History:  reports that she quit smoking about 8 years ago. Her smoking use included cigarettes. She has never used smokeless tobacco. She reports current drug use. Drug: Marijuana. She reports that she does not drink alcohol.  Allergies:  Allergies  Allergen Reactions   Levofloxacin Itching    Medications:  Azelastine HCl 137 MCG/SPRAY SOLN ibuprofen  (ADVIL ) 800 MG tablet meloxicam  (MOBIC ) 15 MG tablet omeprazole  (PRILOSEC) 20 MG capsule ondansetron  (ZOFRAN ) 4 MG tablet Relugolix-Estradiol -Norethind (MYFEMBREE ) 40-1-0.5 MG TABS diclofenac  (VOLTAREN ) 75 MG EC  tablet naproxen  (NAPROSYN ) 500 MG tablet    Results for orders placed or performed during the hospital encounter of 03/11/24 (from the past 48 hours)  Urinalysis, Routine w reflex microscopic -Urine, Clean Catch     Status: None   Collection Time: 03/11/24 12:00 PM  Result Value Ref Range   Color, Urine YELLOW YELLOW   APPearance CLEAR CLEAR   Specific Gravity, Urine 1.015 1.005 - 1.030   pH 5.0 5.0 - 8.0   Glucose, UA NEGATIVE NEGATIVE mg/dL   Hgb urine dipstick NEGATIVE NEGATIVE   Bilirubin Urine NEGATIVE NEGATIVE   Ketones, ur NEGATIVE NEGATIVE mg/dL   Protein, ur NEGATIVE NEGATIVE mg/dL   Nitrite NEGATIVE NEGATIVE   Leukocytes,Ua NEGATIVE NEGATIVE    Comment: Performed at [facility] Lab, [address] [place], [place] [address]  Comprehensive metabolic panel     Status: Abnormal   Collection Time: 03/11/24 12:12 PM  Result Value Ref Range   Sodium 140 135 - 145 mmol/L   Potassium 3.8 3.5 - 5.1 mmol/L   Chloride 105 98 - 111 mmol/L   CO2 25 22 - 32 mmol/L   Glucose, Bld 93 70 - 99 mg/dL    Comment: Glucose reference range applies only to samples taken after fasting for at least 8 hours.   BUN 13 6 - 20 mg/dL   Creatinine, Ser 9.35 0.44 - 1.00 mg/dL   Calcium 8.6 (L) 8.9 - 10.3 mg/dL   Total Protein 5.8 (L) 6.5 - 8.1 g/dL   Albumin 3.3 (L) 3.5 - 5.0 g/dL   AST 27 15 - 41 U/L   ALT 24 0 - 44 U/L   Alkaline Phosphatase 54 38 - 126 U/L   Total Bilirubin 0.3 0.0 - 1.2 mg/dL   GFR, Estimated >39 >39 mL/min    Comment: (NOTE) Calculated using the CKD-EPI Creatinine Equation (2021)    Anion gap 10 5 - 15    Comment: Performed at [facility] Lab, [address] [place], [place] [address]  CBC     Status: Abnormal   Collection Time: 03/11/24 12:12 PM  Result Value Ref Range   WBC 6.9 4.0 - 10.5 K/uL   RBC 1.94 (L) 3.87 - 5.11 MIL/uL   Hemoglobin 5.1 (LL) 12.0 - 15.0 g/dL    Comment: REPEATED TO VERIFY This critical result has been called to LUSTER SOUS, RN by  Gabriela Sorto on 03/11/2024 13:49:05, and has been read back.    HCT 16.8 (L) 36.0 - 46.0 %   MCV 86.6 80.0 - 100.0 fL   MCH 26.3 26.0 - 34.0 pg   MCHC 30.4 30.0 - 36.0 g/dL   RDW 81.0 (H) 88.4 - 84.4 %   Platelets 273 150 - 400 K/uL   nRBC 0.0 0.0 - 0.2 %  Comment: Performed at [facility] Lab, [address] [place], [place] [address]  hCG, serum, qualitative     Status: None   Collection Time: 03/11/24 12:12 PM  Result Value Ref Range   Preg, Serum NEGATIVE NEGATIVE    Comment:        THE SENSITIVITY OF THIS METHODOLOGY IS >10 mIU/mL. Performed at [facility], [address] [place], [place] [address]   Type and screen [facility]     Status: None (Preliminary result)   Collection Time: 03/11/24  1:13 PM  Result Value Ref Range   ABO/RH(D) A POS    Antibody Screen NEG    Sample Expiration 03/14/2024,2359    Unit Number [id number]    Blood Component Type RED CELLS,LR    Unit division 00    Status of Unit ISSUED    Transfusion Status OK TO TRANSFUSE    Crossmatch Result      Compatible Performed at [facility] Lab, [address], [place], [place] [address]    Unit Number [id number]    Blood Component Type RED CELLS,LR    Unit division 00    Status of Unit ISSUED    Transfusion Status OK TO TRANSFUSE    Crossmatch Result Compatible   Prepare RBC (crossmatch)     Status: None   Collection Time: 03/11/24  1:52 PM  Result Value Ref Range   Order Confirmation      ORDER PROCESSED BY BLOOD BANK Performed at [facility], [address], [place], [place] [address]   POC occult blood, ED Provider will collect     Status: Abnormal   Collection Time: 03/11/24  2:08 PM  Result Value Ref Range   Fecal Occult Bld POSITIVE (A) NEGATIVE  Hemoglobin and hematocrit, blood     Status: Abnormal   Collection Time: 03/12/24 12:04 AM  Result Value Ref Range   Hemoglobin 7.1 (L) 12.0 - 15.0 g/dL    Comment: REPEATED TO VERIFY   HCT  21.7 (L) 36.0 - 46.0 %    Comment: Performed at [facility] Lab, [address], [place], [place] [address]  CBC     Status: Abnormal   Collection Time: 03/12/24  3:57 AM  Result Value Ref Range   WBC 6.5 4.0 - 10.5 K/uL   RBC 2.57 (L) 3.87 - 5.11 MIL/uL   Hemoglobin 7.1 (L) 12.0 - 15.0 g/dL   HCT 77.9 (L) 63.9 - 53.9 %   MCV 85.6 80.0 - 100.0 fL   MCH 27.6 26.0 - 34.0 pg   MCHC 32.3 30.0 - 36.0 g/dL   RDW 83.2 (H) 88.4 - 84.4 %   Platelets 255 150 - 400 K/uL   nRBC 0.3 (H) 0.0 - 0.2 %    Comment: Performed at [facility] Lab, [address], [place], [place] [address]  HIV Antibody (routine testing w rflx)     Status: None   Collection Time: 03/12/24  3:57 AM  Result Value Ref Range   HIV Screen 4th Generation wRfx Non Reactive Non Reactive    Comment: Performed at [facility], [address], [place], [place] [address]  Hemoglobin and hematocrit, blood     Status: Abnormal   Collection Time: 03/12/24 10:39 AM  Result Value Ref Range   Hemoglobin 7.5 (L) 12.0 - 15.0 g/dL   HCT 76.7 (L) 63.9 - 53.9 %    Comment: Performed at [facility] Lab, [address] [place], [place] [address]    No results found.  Review of Systems  All other systems reviewed and are negative.  Blood pressure 130/88, pulse 68, temperature 99.1 F (37.3 C), temperature source Oral, resp. rate 18, height 5' 4 (1.626 m), weight 85.7 kg, SpO2 100%. Physical Exam Vitals reviewed.  Constitutional:      General: She is not in acute distress.    Appearance: Normal appearance. She is normal weight. She is not ill-appearing, toxic-appearing or diaphoretic.  HENT:     Head: Normocephalic.     Right Ear: External ear normal.     Left Ear: External ear normal.     Nose: Nose normal.     Mouth/Throat:     Mouth: Mucous membranes are moist.  Eyes:     General: No scleral icterus.       Right eye: No discharge.        Left eye: No discharge.     Extraocular Movements: Extraocular movements  intact.     Conjunctiva/sclera: Conjunctivae normal.     Pupils: Pupils are equal, round, and reactive to light.  Cardiovascular:     Rate and Rhythm: Normal rate and regular rhythm.     Pulses: Normal pulses.     Heart sounds: Normal heart sounds.  Pulmonary:     Effort: Pulmonary effort is normal.     Breath sounds: Normal breath sounds. No wheezing or rhonchi.  Chest:     Chest wall: No tenderness.  Abdominal:     General: Abdomen is flat. Bowel sounds are normal. There is no distension.     Palpations: There is no mass.     Tenderness: There is no abdominal tenderness. There is no guarding or rebound.     Hernia: No hernia is present.  Musculoskeletal:        General: No swelling, tenderness, deformity or signs of injury. Normal range of motion.     Cervical back: Normal range of motion and neck supple. No rigidity or tenderness.  Lymphadenopathy:     Cervical: No cervical adenopathy.  Skin:    General: Skin is warm and dry.     Capillary Refill: Capillary refill takes 2 to 3 seconds.     Coloration: Skin is not jaundiced or pale.     Findings: No bruising or erythema.  Neurological:     General: No focal deficit present.     Mental Status: She is alert. Mental status is at baseline. She is disoriented.     Motor: No weakness.     Coordination: Coordination normal.  Psychiatric:        Mood and Affect: Mood normal.        Behavior: Behavior normal.        Thought Content: Thought content normal.        Judgment: Judgment normal.     Assessment/Plan: Bleeding hemorrhoids Anal fissure Acute blood loss anemia with chronic blood loss anemia  Thankfully, we have colorectal surgery available tomorrow morning.  Have tentatively posted this for then.  Tentative plan is exam under anesthesia with hemorrhoidectomy and potential injection of Botox .  Discussed with the patient that this typically is an extremely painful procedure with a recovery that can last for weeks.   Discussed that pain management consists of sits baths, ice, management of bowel movements in addition to other traditional pain medications.    Discussed risks of surgery including bleeding, infection such as abscess, anal stricture  with potential need for additional surgeries or procedures, fecal incontinence, heart or lung complications, blood clot, other anesthetic complications, possible continued pain, possible nonhealing anal fissure.  Dr. Debby will be available for additional questions in the morning.   Jina LITTIE Nephew, MD, FACS, FSSO Surgical Oncology, General Surgery, Trauma and Critical [facility], [place] [phone] for weekday/non holidays Check [email or web address] for coverage night/weekend/holidays

## 2024-03-12 NOTE — Progress Notes (Signed)
     Daily Progress Note Intern Pager: [phone]  Patient name: [name] Medical record number: [id number] Date of birth: [birth year] Age: 43 y.o. Gender: female  Primary Care Provider: Pcp, No Consultants: GI Code Status: Full  Pt Overview and Major Events to Date:  08/30: Admitted for severe anemia needing blood transfusions  Assessment and Plan:  [name] is a 43yo F admitted for severe anemia requiring blood transfusions. She was sent to the ED from her PCP office with a hgb of 5.1. Hospital course has been uneventful. She has received 2u PRBC this admission with improvement in hgb to 7.1 today. GI has requested general surgery evaluate patient for hemorrhoidectomy prior to colonoscopy. Barriers for discharge include hemorroidectomy, colonoscopy.  Assessment & Plan Anemia Most likely 2/2 long history of significant internal hemorrhoids. Hgb on admission 5.1, currently hemodynamically stable at 7.1 after receiving 2u PRBC in ED.  - GI consulted, appreciate recs   - surgical consult to evaluate for hemorrhoidectomy will determine repeat endoscopic evaluation such as sigmoidoscopy - General surgery consulted, awaiting recs - serial H&H q6h Chronic health problem HTN - patient reportedly not taking blood pressure medications Menorrhagia - s/p uterine ablation June 2025  FEN/GI: regular diet  PPx: SCDs Dispo:Home pending clinical improvement .  Subjective:  Patient was seen and examined at bedside.   Objective: Temp:  [98.1 F (36.7 C)-98.8 F (37.1 C)] 98.3 F (36.8 C) (08/30 0318) Pulse Rate:  [74-103] 74 (08/30 0318) Resp:  [13-26] 16 (08/30 0318) BP: (110-158)/(55-86) 126/81 (08/30 0318) SpO2:  [98 %-100 %] 98 % (08/30 0318) Weight:  [85.7 kg] 85.7 kg (08/29 1158)  Physical Exam: General: A&O, NAD Respiratory: normal WOB GI: non-distended  Extremities: no peripheral edema. Neuro: moves all four extremities appropriately Skin: no lesions/rashes  visualized Psych: Appropriate mood and affect    Laboratory: Most recent CBC Lab Results  Component Value Date   WBC 6.5 03/12/2024   HGB 7.1 (L) 03/12/2024   HCT 22.0 (L) 03/12/2024   MCV 85.6 03/12/2024   PLT 255 03/12/2024   Most recent BMP    Latest Ref Rng & Units 03/11/2024   12:12 PM  BMP  Glucose 70 - 99 mg/dL 93   BUN 6 - 20 mg/dL 13   Creatinine 9.55 - 1.00 mg/dL 9.35   Sodium 864 - 854 mmol/L 140   Potassium 3.5 - 5.1 mmol/L 3.8   Chloride 98 - 111 mmol/L 105   CO2 22 - 32 mmol/L 25   Calcium 8.9 - 10.3 mg/dL 8.6    Lupie Credit, DO 03/12/2024, 7:19 AM  PGY-1, [facility] FPTS Intern pager: [phone], text pages welcome Secure chat group [facility] Teaching Service

## 2024-03-12 NOTE — Assessment & Plan Note (Addendum)
 HTN - patient reportedly not taking blood pressure medications Menorrhagia - s/p uterine ablation June 2025

## 2024-03-12 NOTE — Plan of Care (Signed)
 Spoke with Marjorie from general surgery.  She will speak to her attending physician about consideration of hemorrhoidectomy.  We greatly appreciate general surgery's assistance with this patient's care.

## 2024-03-12 NOTE — Assessment & Plan Note (Signed)
 VSS stable; Hgb 8.0 this AM. Plan for hemorrhoidectomy today with general surgery. - GI consulted, appreciate recs; further GI intervention to be determined after surgery.

## 2024-03-12 NOTE — Plan of Care (Signed)

## 2024-03-12 NOTE — Assessment & Plan Note (Signed)
 HTN - patient reportedly not taking blood pressure medications Menorrhagia - s/p uterine ablation June 2025

## 2024-03-12 NOTE — Assessment & Plan Note (Addendum)
 Most likely 2/2 long history of significant internal hemorrhoids. Hgb on admission 5.1, currently hemodynamically stable at 7.1 after receiving 2u PRBC in ED.  - GI consulted, appreciate recs   - surgical consult to evaluate for hemorrhoidectomy will determine repeat endoscopic evaluation such as sigmoidoscopy - General surgery consulted, awaiting recs - serial H&H q6h

## 2024-03-12 NOTE — Progress Notes (Signed)
 Informed consent order placed by [facility] MD-Attempted to have patient sign it and patient stated she has not had a face-to face surgery consult. [facility] was paged and when they called RN back, they stated that the consent order could be sent down in the chart with patient in the morning. See provider notification in flowsheet. Eldra Word S Abron Neddo

## 2024-03-12 NOTE — Progress Notes (Signed)
     Daily Progress Note Intern Pager: [phone]  Patient name: [name] Medical record number: [id number] Date of birth: 1981-05-22 Age: 43 y.o. Gender: female  Primary Care Provider: Pcp, No Consultants: GI Code Status: FULL  Pt Overview and Major Events to Date:  8/29 - admitted to FMTS  Assessment and Plan: [name] is a 43 y.o. female with PMH of abnormal uterine bleeding s/p ablation, HTN, IDA, admitted for severe anemia (Hgb 5.1) requiring blood transfusion. Hgb improved to 7.1, and now awaiting hemorrhoidectomy today. Further GI workup pending. Assessment & Plan Anemia VSS stable; Hgb 8.0 this AM. Plan for hemorrhoidectomy today with general surgery. - GI consulted, appreciate recs; further GI intervention to be determined after surgery. Chronic health problem HTN - patient reportedly not taking blood pressure medications Menorrhagia - s/p uterine ablation June 2025  FEN/GI: NPO after MN; restart regular diet after procedure. PPx: SCDs Dispo:Home pending clinical improvement . Barriers include continued Hgb stability and full GI workup as indicated.   Subjective:  Patient seen this AM resting in bed with father at bedside; she is feeling well but is hungry. She is relieved that hgb is coming up s/p transfusion and is concerned about her iron levels. No other complaints.  Objective: Temp:  [97.5 F (36.4 C)-99.3 F (37.4 C)] 97.5 F (36.4 C) (08/31 0551) Pulse Rate:  [68-79] 76 (08/31 0551) Resp:  [16-18] 18 (08/30 1551) BP: (130-143)/(79-88) 143/82 (08/31 0551) SpO2:  [100 %] 100 % (08/31 0551) Physical Exam: General: well-appearing, NAD Cardiovascular: RRR, no murmurs Respiratory: no increased WOB, normal breath sounds without wheezing. Abdomen: Normoactive bowel sounds, non-distended.  Extremities: moves all equally.  Laboratory: Most recent CBC Lab Results  Component Value Date   WBC 6.5 03/12/2024   HGB 8.0 (L) 03/13/2024   HCT 24.6 (L) 03/13/2024    MCV 85.6 03/12/2024   PLT 255 03/12/2024   Most recent BMP    Latest Ref Rng & Units 03/11/2024   12:12 PM  BMP  Glucose 70 - 99 mg/dL 93   BUN 6 - 20 mg/dL 13   Creatinine 9.55 - 1.00 mg/dL 9.35   Sodium 864 - 854 mmol/L 140   Potassium 3.5 - 5.1 mmol/L 3.8   Chloride 98 - 111 mmol/L 105   CO2 22 - 32 mmol/L 25   Calcium 8.9 - 10.3 mg/dL 8.6     Lafe Domino, DO 03/13/2024, 6:37 AM  PGY-2, [place] [facility] FPTS Intern pager: [phone], text pages welcome Secure chat group [facility] Teaching Service

## 2024-03-13 ENCOUNTER — Inpatient Hospital Stay (HOSPITAL_COMMUNITY): Admitting: Anesthesiology

## 2024-03-13 ENCOUNTER — Encounter (HOSPITAL_COMMUNITY)
Admission: EM | Disposition: A | Discharge status: Home / Self Care | Payer: Self-pay | Source: Home / Self Care | Attending: Family Medicine

## 2024-03-13 ENCOUNTER — Encounter (HOSPITAL_COMMUNITY): Payer: Self-pay | Admitting: Family Medicine

## 2024-03-13 DIAGNOSIS — K649 Unspecified hemorrhoids: Secondary | ICD-10-CM | POA: Diagnosis not present

## 2024-03-13 DIAGNOSIS — K642 Third degree hemorrhoids: Secondary | ICD-10-CM

## 2024-03-13 DIAGNOSIS — K6282 Dysplasia of anus: Secondary | ICD-10-CM | POA: Diagnosis not present

## 2024-03-13 DIAGNOSIS — D649 Anemia, unspecified: Secondary | ICD-10-CM | POA: Diagnosis not present

## 2024-03-13 DIAGNOSIS — A630 Anogenital (venereal) warts: Secondary | ICD-10-CM | POA: Diagnosis not present

## 2024-03-13 DIAGNOSIS — D62 Acute posthemorrhagic anemia: Secondary | ICD-10-CM | POA: Diagnosis not present

## 2024-03-13 DIAGNOSIS — K648 Other hemorrhoids: Secondary | ICD-10-CM | POA: Diagnosis not present

## 2024-03-13 HISTORY — PX: HEMORRHOID SURGERY: SHX153

## 2024-03-13 LAB — HEMOGLOBIN AND HEMATOCRIT, BLOOD
HCT: 24.6 % — ABNORMAL LOW (ref 36.0–46.0)
Hemoglobin: 8 g/dL — ABNORMAL LOW (ref 12.0–15.0)

## 2024-03-13 LAB — SURGICAL PCR SCREEN
MRSA, PCR: NEGATIVE
Staphylococcus aureus: NEGATIVE

## 2024-03-13 SURGERY — HEMORRHOIDECTOMY
Anesthesia: General

## 2024-03-13 MED ORDER — FENTANYL CITRATE (PF) 100 MCG/2ML IJ SOLN
INTRAMUSCULAR | Status: AC
  Filled 2024-03-13: qty 2

## 2024-03-13 MED ORDER — ACETAMINOPHEN 10 MG/ML IV SOLN
INTRAVENOUS | Status: AC
  Filled 2024-03-13: qty 100

## 2024-03-13 MED ORDER — DEXAMETHASONE SODIUM PHOSPHATE 10 MG/ML IJ SOLN
INTRAMUSCULAR | Status: DC | PRN
  Administered 2024-03-13: 10 mg via INTRAVENOUS

## 2024-03-13 MED ORDER — HYDROMORPHONE HCL 1 MG/ML IJ SOLN
INTRAMUSCULAR | Status: AC
  Filled 2024-03-13: qty 0.5

## 2024-03-13 MED ORDER — LIDOCAINE 2% (20 MG/ML) 5 ML SYRINGE
INTRAMUSCULAR | Status: DC | PRN
  Administered 2024-03-13: 100 mg via INTRAVENOUS

## 2024-03-13 MED ORDER — OXYCODONE HCL 5 MG PO TABS: 5.0000 mg | ORAL_TABLET | ORAL | Status: DC | PRN

## 2024-03-13 MED ORDER — FENTANYL CITRATE (PF) 100 MCG/2ML IJ SOLN
25.0000 ug | INTRAMUSCULAR | Status: DC | PRN
  Administered 2024-03-13 (×3): 50 ug via INTRAVENOUS

## 2024-03-13 MED ORDER — CEFAZOLIN SODIUM-DEXTROSE 2-3 GM-%(50ML) IV SOLR
INTRAVENOUS | Status: DC | PRN
  Administered 2024-03-13: 2 g via INTRAVENOUS

## 2024-03-13 MED ORDER — PROPOFOL 10 MG/ML IV BOLUS
INTRAVENOUS | Status: AC
  Filled 2024-03-13: qty 20

## 2024-03-13 MED ORDER — ACETAMINOPHEN 10 MG/ML IV SOLN
1000.0000 mg | Freq: Once | INTRAVENOUS | Status: DC | PRN
  Administered 2024-03-13: 1000 mg via INTRAVENOUS

## 2024-03-13 MED ORDER — OXYCODONE HCL 5 MG PO TABS: 5.0000 mg | ORAL_TABLET | Freq: Four times a day (QID) | ORAL | Status: DC

## 2024-03-13 MED ORDER — OXYCODONE HCL 5 MG PO TABS: 10.0000 mg | ORAL_TABLET | ORAL | Status: DC | PRN

## 2024-03-13 MED ORDER — ONDANSETRON HCL 4 MG/2ML IJ SOLN
INTRAMUSCULAR | Status: DC | PRN
  Administered 2024-03-13: 4 mg via INTRAVENOUS

## 2024-03-13 MED ORDER — PHENOL 1.4 % MT LIQD: 1.0000 | OROMUCOSAL | Status: DC | PRN

## 2024-03-13 MED ORDER — BUPIVACAINE LIPOSOME 1.3 % IJ SUSP
INTRAMUSCULAR | Status: AC
  Filled 2024-03-13: qty 20

## 2024-03-13 MED ORDER — ROCURONIUM BROMIDE 10 MG/ML (PF) SYRINGE
PREFILLED_SYRINGE | INTRAVENOUS | Status: DC | PRN
  Administered 2024-03-13: 50 mg via INTRAVENOUS

## 2024-03-13 MED ORDER — ORAL CARE MOUTH RINSE: 15.0000 mL | Freq: Once | OROMUCOSAL | Status: AC

## 2024-03-13 MED ORDER — DROPERIDOL 2.5 MG/ML IJ SOLN: 0.6250 mg | Freq: Once | INTRAMUSCULAR | Status: DC | PRN

## 2024-03-13 MED ORDER — ONDANSETRON HCL 4 MG/2ML IJ SOLN
INTRAMUSCULAR | Status: AC
  Filled 2024-03-13: qty 2

## 2024-03-13 MED ORDER — BUPIVACAINE-EPINEPHRINE (PF) 0.5% -1:200000 IJ SOLN
INTRAMUSCULAR | Status: DC | PRN
  Administered 2024-03-13: 20 mL via INTRAMUSCULAR
  Administered 2024-03-13: 30 mL via INTRAMUSCULAR

## 2024-03-13 MED ORDER — OXYCODONE HCL 5 MG PO TABS: 5.0000 mg | ORAL_TABLET | Freq: Four times a day (QID) | ORAL | 0 refills | Status: DC | PRN

## 2024-03-13 MED ORDER — DEXAMETHASONE SODIUM PHOSPHATE 10 MG/ML IJ SOLN
INTRAMUSCULAR | Status: AC
  Filled 2024-03-13: qty 1

## 2024-03-13 MED ORDER — PROPOFOL 10 MG/ML IV BOLUS
INTRAVENOUS | Status: DC | PRN
  Administered 2024-03-13: 150 mg via INTRAVENOUS

## 2024-03-13 MED ORDER — OXYCODONE HCL 5 MG PO TABS
10.0000 mg | ORAL_TABLET | Freq: Four times a day (QID) | ORAL | Status: DC | PRN
  Administered 2024-03-13: 10 mg via ORAL
  Filled 2024-03-13: qty 2

## 2024-03-13 MED ORDER — OXYCODONE HCL 5 MG PO TABS
5.0000 mg | ORAL_TABLET | ORAL | Status: DC | PRN
  Administered 2024-03-13 (×2): 10 mg via ORAL
  Administered 2024-03-14: 5 mg via ORAL
  Filled 2024-03-13 (×3): qty 2

## 2024-03-13 MED ORDER — HYDROMORPHONE HCL 2 MG PO TABS
1.0000 mg | ORAL_TABLET | Freq: Once | ORAL | Status: AC | PRN
  Administered 2024-03-13: 1 mg via ORAL
  Filled 2024-03-13: qty 1

## 2024-03-13 MED ORDER — CHLORHEXIDINE GLUCONATE 0.12 % MT SOLN
OROMUCOSAL | Status: AC
  Filled 2024-03-13: qty 15

## 2024-03-13 MED ORDER — POLYETHYLENE GLYCOL 3350 17 G PO PACK
17.0000 g | PACK | Freq: Every day | ORAL | Status: DC
  Administered 2024-03-13 – 2024-03-14 (×2): 17 g via ORAL
  Filled 2024-03-13 (×2): qty 1

## 2024-03-13 MED ORDER — BUPIVACAINE LIPOSOME 1.3 % IJ SUSP: 20.0000 mL | INTRAMUSCULAR | Status: AC

## 2024-03-13 MED ORDER — ONABOTULINUMTOXINA 100 UNITS IJ SOLR
INTRAMUSCULAR | Status: DC | PRN
  Administered 2024-03-13: 100 [IU] via INTRADERMAL

## 2024-03-13 MED ORDER — PHENYLEPHRINE 80 MCG/ML (10ML) SYRINGE FOR IV PUSH (FOR BLOOD PRESSURE SUPPORT)
PREFILLED_SYRINGE | INTRAVENOUS | Status: DC | PRN
  Administered 2024-03-13 (×2): 160 ug via INTRAVENOUS

## 2024-03-13 MED ORDER — HYDROMORPHONE HCL 1 MG/ML IJ SOLN
INTRAMUSCULAR | Status: DC | PRN
  Administered 2024-03-13: .5 mg via INTRAVENOUS

## 2024-03-13 MED ORDER — OXYCODONE HCL 5 MG/5ML PO SOLN
ORAL | Status: AC
  Filled 2024-03-13: qty 5

## 2024-03-13 MED ORDER — ACETAMINOPHEN 500 MG PO TABS
1000.0000 mg | ORAL_TABLET | Freq: Three times a day (TID) | ORAL | Status: DC
  Administered 2024-03-13: 1000 mg via ORAL
  Filled 2024-03-13 (×2): qty 2

## 2024-03-13 MED ORDER — OXYCODONE HCL 5 MG/5ML PO SOLN
5.0000 mg | Freq: Once | ORAL | Status: AC | PRN
  Administered 2024-03-13: 5 mg via ORAL

## 2024-03-13 MED ORDER — SENNA 8.6 MG PO TABS
1.0000 | ORAL_TABLET | Freq: Every day | ORAL | Status: DC
  Administered 2024-03-13 – 2024-03-14 (×2): 8.6 mg via ORAL
  Filled 2024-03-13 (×2): qty 1

## 2024-03-13 MED ORDER — ROCURONIUM BROMIDE 10 MG/ML (PF) SYRINGE
PREFILLED_SYRINGE | INTRAVENOUS | Status: AC
  Filled 2024-03-13: qty 10

## 2024-03-13 MED ORDER — PHENYLEPHRINE 80 MCG/ML (10ML) SYRINGE FOR IV PUSH (FOR BLOOD PRESSURE SUPPORT)
PREFILLED_SYRINGE | INTRAVENOUS | Status: AC
  Filled 2024-03-13: qty 10

## 2024-03-13 MED ORDER — LACTATED RINGERS IV SOLN: INTRAVENOUS | Status: DC

## 2024-03-13 MED ORDER — BUPIVACAINE-EPINEPHRINE (PF) 0.5% -1:200000 IJ SOLN
INTRAMUSCULAR | Status: AC
  Filled 2024-03-13: qty 30

## 2024-03-13 MED ORDER — CHLORHEXIDINE GLUCONATE 0.12 % MT SOLN
15.0000 mL | Freq: Once | OROMUCOSAL | Status: AC
  Administered 2024-03-13: 15 mL via OROMUCOSAL

## 2024-03-13 MED ORDER — ACETAMINOPHEN 500 MG PO TABS
1000.0000 mg | ORAL_TABLET | Freq: Four times a day (QID) | ORAL | Status: DC
  Administered 2024-03-13 – 2024-03-14 (×3): 1000 mg via ORAL
  Filled 2024-03-13 (×2): qty 2

## 2024-03-13 MED ORDER — OXYCODONE HCL 5 MG PO TABS: 5.0000 mg | ORAL_TABLET | Freq: Once | ORAL | Status: AC | PRN

## 2024-03-13 MED ORDER — MIDAZOLAM HCL 2 MG/2ML IJ SOLN
INTRAMUSCULAR | Status: AC
Start: 2024-03-13 — End: 2024-03-13
  Filled 2024-03-13: qty 2

## 2024-03-13 MED ORDER — HYDROMORPHONE HCL 1 MG/ML IJ SOLN
0.5000 mg | Freq: Once | INTRAMUSCULAR | Status: AC
  Administered 2024-03-13: 0.5 mg via INTRAVENOUS
  Filled 2024-03-13: qty 0.5

## 2024-03-13 MED ORDER — CEFAZOLIN SODIUM-DEXTROSE 2-4 GM/100ML-% IV SOLN
INTRAVENOUS | Status: AC
  Filled 2024-03-13: qty 100

## 2024-03-13 MED ORDER — SUGAMMADEX SODIUM 200 MG/2ML IV SOLN
INTRAVENOUS | Status: DC | PRN
  Administered 2024-03-13: 300 mg via INTRAVENOUS

## 2024-03-13 MED ORDER — MIDAZOLAM HCL 2 MG/2ML IJ SOLN
INTRAMUSCULAR | Status: DC | PRN
  Administered 2024-03-13: 2 mg via INTRAVENOUS

## 2024-03-13 MED ORDER — LIDOCAINE 2% (20 MG/ML) 5 ML SYRINGE
INTRAMUSCULAR | Status: AC
  Filled 2024-03-13: qty 5

## 2024-03-13 MED ORDER — 0.9 % SODIUM CHLORIDE (POUR BTL) OPTIME
TOPICAL | Status: DC | PRN
  Administered 2024-03-13: 1000 mL

## 2024-03-13 SURGICAL SUPPLY — 33 items
BAG COUNTER SPONGE SURGICOUNT (BAG) ×1 IMPLANT
CANISTER SUCTION 3000ML PPV (SUCTIONS) ×1 IMPLANT
COVER MAYO STAND STRL (DRAPES) ×1 IMPLANT
COVER SURGICAL LIGHT HANDLE (MISCELLANEOUS) ×1 IMPLANT
DRAPE UTILITY XL STRL (DRAPES) ×1 IMPLANT
ELECTRODE REM PT RTRN 9FT ADLT (ELECTROSURGICAL) ×1 IMPLANT
GAUZE 4X4 16PLY ~~LOC~~+RFID DBL (SPONGE) ×1 IMPLANT
GAUZE PAD ABD 8X10 STRL (GAUZE/BANDAGES/DRESSINGS) IMPLANT
GAUZE SPONGE 4X4 12PLY STRL (GAUZE/BANDAGES/DRESSINGS) ×1 IMPLANT
GLOVE BIO SURGEON STRL SZ8 (GLOVE) ×1 IMPLANT
GLOVE BIOGEL PI IND STRL 8 (GLOVE) ×1 IMPLANT
GOWN STRL REUS W/ TWL LRG LVL3 (GOWN DISPOSABLE) ×1 IMPLANT
GOWN STRL REUS W/ TWL XL LVL3 (GOWN DISPOSABLE) ×1 IMPLANT
KIT BASIN OR (CUSTOM PROCEDURE TRAY) ×1 IMPLANT
KIT SIGMOIDOSCOPE (SET/KITS/TRAYS/PACK) IMPLANT
KIT TURNOVER KIT B (KITS) ×1 IMPLANT
NDL 22X1.5 STRL (OR ONLY) (MISCELLANEOUS) ×1 IMPLANT
NEEDLE 22X1.5 STRL (OR ONLY) (MISCELLANEOUS) ×1 IMPLANT
NS IRRIG 1000ML POUR BTL (IV SOLUTION) ×1 IMPLANT
PACK LITHOTOMY IV (CUSTOM PROCEDURE TRAY) ×1 IMPLANT
PAD ARMBOARD POSITIONER FOAM (MISCELLANEOUS) ×2 IMPLANT
PENCIL SMOKE EVACUATOR (MISCELLANEOUS) ×1 IMPLANT
SHEARS HARMONIC 9CM CVD (BLADE) ×1 IMPLANT
SPECIMEN JAR SMALL (MISCELLANEOUS) ×1 IMPLANT
SPONGE SURGIFOAM ABS GEL 100 (HEMOSTASIS) IMPLANT
SURGILUBE 2OZ TUBE FLIPTOP (MISCELLANEOUS) ×1 IMPLANT
SUT CHROMIC 2 0 SH (SUTURE) ×1 IMPLANT
SUT CHROMIC 3 0 SH 27 (SUTURE) ×1 IMPLANT
SYR CONTROL 10ML LL (SYRINGE) ×1 IMPLANT
TOWEL GREEN STERILE FF (TOWEL DISPOSABLE) ×2 IMPLANT
TUBE CONNECTING 12X1/4 (SUCTIONS) ×1 IMPLANT
UNDERPAD 30X36 HEAVY ABSORB (UNDERPADS AND DIAPERS) ×1 IMPLANT
YANKAUER SUCT BULB TIP NO VENT (SUCTIONS) ×1 IMPLANT

## 2024-03-13 NOTE — Progress Notes (Signed)
 Symptomatic anemia  Subjective: Pt  ready for surgery  Objective: Vital signs in last 24 hours: Temp:  [97.5 F (36.4 C)-99.3 F (37.4 C)] 97.5 F (36.4 C) (08/31 0551) Pulse Rate:  [68-79] 76 (08/31 0551) Resp:  [16-18] 16 (08/31 0551) BP: (130-143)/(79-88) 143/82 (08/31 0551) SpO2:  [100 %] 100 % (08/31 0551) Last BM Date : 03/09/24  Intake/Output from previous day: 08/30 0701 - 08/31 0700 In: 960 [P.O.:960] Out: -  Intake/Output this shift: No intake/output data recorded.  General appearance: alert and cooperative  Lab Results:  Results for orders placed or performed during the hospital encounter of 03/11/24 (from the past 24 hours)  Hemoglobin and hematocrit, blood     Status: Abnormal   Collection Time: 03/12/24 10:39 AM  Result Value Ref Range   Hemoglobin 7.5 (L) 12.0 - 15.0 g/dL   HCT 76.7 (L) 63.9 - 53.9 %  Hemoglobin and hematocrit, blood     Status: Abnormal   Collection Time: 03/12/24  3:26 PM  Result Value Ref Range   Hemoglobin 7.7 (L) 12.0 - 15.0 g/dL   HCT 76.0 (L) 63.9 - 53.9 %  Hemoglobin and hematocrit, blood     Status: Abnormal   Collection Time: 03/12/24 10:22 PM  Result Value Ref Range   Hemoglobin 7.9 (L) 12.0 - 15.0 g/dL   HCT 75.5 (L) 63.9 - 53.9 %  Hemoglobin and hematocrit, blood     Status: Abnormal   Collection Time: 03/13/24  3:24 AM  Result Value Ref Range   Hemoglobin 8.0 (L) 12.0 - 15.0 g/dL   HCT 75.3 (L) 63.9 - 53.9 %     Studies/Results Radiology     MEDS, Scheduled  [MAR Hold] botulinum toxin Type A   100 Units Intramuscular To SS-Surg   chlorhexidine   15 mL Mouth/Throat Once   Or   mouth rinse  15 mL Mouth Rinse Once     Assessment: Symptomatic anemia Grade 3 hemorrhoids  Plan: OR today for hemorrhoidectomy.  I expressed to her the importance of continuing her bowel regimen post op.  We discussed typical pain and ways to manage this as she heals.  Risks include bleeding, pain, recurrence and stricture.  All  questions were answered.  Pt agrees to proceed.   LOS: 2 days    Bernarda JAYSON Ned, MD [facility], [place]     03/13/2024 7:05 AM

## 2024-03-13 NOTE — Anesthesia Procedure Notes (Signed)
 Procedure Name: Intubation Date/Time: 03/13/2024 7:43 AM  Performed by: Tressie Gilmore RAMAN, CRNAPre-anesthesia Checklist: Patient identified, Emergency Drugs available, Suction available and Patient being monitored Patient Re-evaluated:Patient Re-evaluated prior to induction Oxygen Delivery Method: Circle System Utilized Preoxygenation: Pre-oxygenation with 100% oxygen Induction Type: IV induction Ventilation: Mask ventilation without difficulty Laryngoscope Size: Miller and 2 Grade View: Grade III Tube type: Oral Tube size: 7.0 mm Number of attempts: 1 Airway Equipment and Method: Stylet and Oral airway Placement Confirmation: ETT inserted through vocal cords under direct vision, positive ETCO2 and breath sounds checked- equal and bilateral Secured at: 20 cm Tube secured with: Tape Dental Injury: Teeth and Oropharynx as per pre-operative assessment

## 2024-03-13 NOTE — Plan of Care (Signed)

## 2024-03-13 NOTE — Progress Notes (Addendum)
 2200 Pt informed & reminded to keep NPO for OR tomorrow.  Understanding verbalized.   9444 Kept NPO for OR to have Hemorrhoidectomy & removal of anal fissures with possible botox  injection.  9359 Left for OR on bed, accompanied by transporter. NAD noted.

## 2024-03-13 NOTE — Plan of Care (Signed)
 FMTS Interim Progress Note  S: Evaluated patient postop with Dr. Idelle.  Patient complaints of throat pain and pain around her rectum.  O: BP (!) 142/84 (BP Location: Right Arm)   Pulse 73   Temp 97.8 F (36.6 C) (Oral)   Resp 11   Ht 5' 4 (1.626 m)   Wt 85.7 kg   SpO2 97%   BMI 32.44 kg/m   General: mild distress secondary to pain Cardiovascular: RRR, no m/r/g Respiratory: normal work of breathing on RA Abdomen: soft, non-tender Extremities: No swelling BLE  A/P: Post 3 column hemorrhoidectomy, chemical sphincterotomy, and excision of perianal lesion - Pain regimen (adjust as needed)  - Tylenol  1000mg  q8h  - Oxycodone  5mg -10mg  q4h PRN for moderate to severe pain - Will give one time IV Dilaudid  0.5mg  due to severe pain on exam at this time - Use narcotics judiciously to prevent constipation - Phenol spray PRN for throat irritation - Bowel Regimen   - Miralax  daily  - Senna daily - AVOID CONSTIPATION  Marilyn Broberg, DO 03/13/2024, 11:43 AM PGY-2, [facility] Family Medicine Service pager [phone]

## 2024-03-13 NOTE — Discharge Instructions (Signed)
 ANORECTAL SURGERY: POST OP INSTRUCTIONS Take your usually prescribed home medications unless otherwise directed. DIET: During the first few hours after surgery sip on some liquids until you are able to urinate.  It is normal to not urinate for several hours after this surgery.  If you feel uncomfortable, please contact the office for instructions.  After you are able to urinate,you may eat, if you feel like it.  Follow a light bland diet the first 24 hours after arrival home, such as soup, liquids, crackers, etc.  Be sure to include lots of fluids daily (6-8 glasses).  Avoid fast food or heavy meals, as your are more likely to get nauseated.  Eat a low fat diet the next few days after surgery.  Limit caffeine  intake to 1-2 servings a day. PAIN CONTROL: Pain is best controlled by a usual combination of several different methods TOGETHER: Muscle relaxation: Soak in a warm bath (or Sitz bath) three times a day and after bowel movements.  Continue to do this until all pain is resolved. Over the counter pain medication Prescription pain medication Most patients will experience some swelling and discomfort in the anus/rectal area and incisions.  Heat such as warm towels, sitz baths, warm baths, etc to help relax tight/sore spots and speed recovery.  Some people prefer to use ice, especially in the first couple days after surgery, as it may decrease the pain and swelling, or alternate between ice & heat.  Experiment to what works for you.  Swelling and bruising can take several weeks to resolve.  Pain can take even longer to completely resolve. It is helpful to take an over-the-counter pain medication regularly for the first few weeks.  Choose one of the following that works best for you: Naproxen  (Aleve , etc)  Two 220mg  tabs twice a day Ibuprofen  (Advil , etc) Three 200mg  tabs four times a day (every meal & bedtime) A  prescription for pain medication (such as percocet, oxycodone , hydrocodone , etc) should be  given to you upon discharge.  Take your pain medication as prescribed.  If you are having problems/concerns with the prescription medicine (does not control pain, nausea, vomiting, rash, itching, etc), please call us  [phone] to see if we need to switch you to a different pain medicine that will work better for you and/or control your side effect better. If you need a refill on your pain medication, please contact your pharmacy.  They will contact our office to request authorization. Prescriptions will not be filled after 5 pm or on week-ends. KEEP YOUR BOWELS REGULAR and AVOID CONSTIPATION The goal is one to two soft bowel movements a day.  You should at least have a bowel movement every other day. Avoid getting constipated.  Between the surgery and the pain medications, it is common to experience some constipation. This can be very painful after rectal surgery.  Increasing fluid intake and taking a fiber supplement (such as Metamucil, Citrucel, FiberCon, etc) 1-2 times a day regularly will usually help prevent this problem from occurring.  A stool softener like colace is also recommended.  This can be purchased over the counter at your pharmacy.  You can take it up to 3 times a day.  If you do not have a bowel movement after 24 hrs since your surgery, take one does of milk of magnesia.  If you still haven't had a bowel movement 8-12 hours after that dose, take another dose.  If you don't have a bowel movement 48 hrs after surgery,  purchase a Fleets enema from the drug store and administer gently per package instructions.  If you still are having trouble with your bowel movements after that, please call the office for further instructions. If you develop diarrhea or have many loose bowel movements, simplify your diet to bland foods & liquids for a few days.  Stop any stool softeners and decrease your fiber supplement.  Switching to mild anti-diarrheal medications (Kayopectate, Pepto Bismol) can help.   If this worsens or does not improve, please call us .  Wound Care Remove your bandages before your first bowel movement or 8 hours after surgery.     Remove any wound packing material at this tim,e as well.  You do not need to repack the wound unless instructed otherwise.  Wear an absorbent pad or soft cotton gauze in your underwear to catch any drainage and help keep the area clean. You should change this every 2-3 hours while awake. Keep the area clean and dry.  Bathe / shower every day, especially after bowel movements.  Keep the area clean by showering / bathing over the incision / wound.   It is okay to soak an open wound to help wash it.  Wet wipes or showers / gentle washing after bowel movements is often less traumatic than regular toilet paper. You may have some styrofoam-like soft packing in the rectum which will come out with the first bowel movement.  You will often notice bleeding with bowel movements.  This should slow down by the end of the first week of surgery Expect some drainage.  This should slow down, too, by the end of the first week of surgery.  Wear an absorbent pad or soft cotton gauze in your underwear until the drainage stops. Do Not sit on a rubber or pillow ring.  This can make you symptoms worse.  You may sit on a soft pillow if needed.  ACTIVITIES as tolerated:   You may resume regular (light) daily activities beginning the next day--such as daily self-care, walking, climbing stairs--gradually increasing activities as tolerated.  If you can walk 30 minutes without difficulty, it is safe to try more intense activity such as jogging, treadmill, bicycling, low-impact aerobics, swimming, etc. Save the most intensive and strenuous activity for last such as sit-ups, heavy lifting, contact sports, etc  Refrain from any heavy lifting or straining until you are off narcotics for pain control.   You may drive when you are no longer taking prescription pain medication, you can  comfortably sit for long periods of time, and you can safely maneuver your car and apply brakes. You may have sexual intercourse when it is comfortable.  FOLLOW UP in our office Please call [facility] at [phone] to set up an appointment to see your surgeon in the office for a follow-up appointment approximately 3-4 weeks after your surgery. Make sure that you call for this appointment the day you arrive home to insure a convenient appointment time. 10. IF YOU HAVE DISABILITY OR FAMILY LEAVE FORMS, BRING THEM TO THE OFFICE FOR PROCESSING.  DO NOT GIVE THEM TO YOUR DOCTOR.     WHEN TO CALL US  [phone]: Poor pain control Reactions / problems with new medications (rash/itching, nausea, etc)  Fever over 101.5 F (38.5 C) Inability to urinate Nausea and/or vomiting Worsening swelling or bruising Continued bleeding from incision. Increased pain, redness, or drainage from the incision  The clinic staff is available to answer your questions during regular business hours (8:30am-5pm).  Please don't hesitate to call and ask to speak to one of our nurses for clinical concerns.   A surgeon from [facility] is always on call at the hospitals   If you have a medical emergency, go to the nearest emergency room or call 911.    [facility] [address], [place], [place]  [address] ? MAIN: [phone] ? TOLL FREE: [phone] ? FAX [phone] [email or web address]    Dear [name],  Thank you for letting us  participate in your care. You were hospitalized for rectal bleeding and diagnosed with Symptomatic anemia. You were treated with hemorrhoidectomy.   POST-HOSPITAL & CARE INSTRUCTIONS Be sure to take all of your medications as prescribed. Go to your follow up appointments (listed below)  DOCTOR'S APPOINTMENT   No future appointments.  Follow-up Information     Debby Hila, MD. Schedule an appointment as soon as  possible for a visit in 3 week(s).   Specialties: General Surgery, Colon and Rectal Surgery Contact information: [address] [place] [address] [place] [address] [phone]         [facility]. Schedule an appointment as soon as possible for a visit.   Why: Call to make an appointment ASAP for hospital follow up. Contact information: [address] [place]  [phone]                Take care and be well!  Family Medicine Teaching Service Inpatient Team [place]  [facility]  [address] [place], [place] [address] [phone]

## 2024-03-13 NOTE — Transfer of Care (Signed)
 Immediate Anesthesia Transfer of Care Note  Patient: [name]  Procedure(s) Performed: HEMORRHOIDECTOMY  Patient Location: PACU  Anesthesia Type:General  Level of Consciousness: drowsy  Airway & Oxygen Therapy: Patient Spontanous Breathing  Post-op Assessment: Report given to RN and Post -op Vital signs reviewed and stable  Post vital signs: Reviewed and stable  Last Vitals:  Vitals Value Taken Time  BP 158/89 03/13/24 09:01  Temp 36.5 C 03/13/24 09:00  Pulse 77 03/13/24 09:06  Resp 20 03/13/24 09:06  SpO2 96 % 03/13/24 09:06  Vitals shown include unfiled device data.  Last Pain:  Vitals:   03/13/24 0707  TempSrc: Oral  PainSc: 4       Patients Stated Pain Goal: 0 (03/12/24 1940)  Complications: There were no known notable events for this encounter.

## 2024-03-13 NOTE — Anesthesia Postprocedure Evaluation (Signed)
 Anesthesia Post Note  Patient: [name]  Procedure(s) Performed: HEMORRHOIDECTOMY     Patient location during evaluation: PACU Anesthesia Type: General Level of consciousness: awake and alert Pain management: pain level controlled Vital Signs Assessment: post-procedure vital signs reviewed and stable Respiratory status: spontaneous breathing, nonlabored ventilation, respiratory function stable and patient connected to nasal cannula oxygen Cardiovascular status: blood pressure returned to baseline and stable Postop Assessment: no apparent nausea or vomiting Anesthetic complications: no   There were no known notable events for this encounter.  Last Vitals:  Vitals:   03/13/24 1030 03/13/24 1039  BP: 127/77 (!) 142/84  Pulse: 71 73  Resp: 11   Temp:  36.6 C  SpO2: 97%     Last Pain:  Vitals:   03/13/24 1142  TempSrc:   PainSc: 7                  Thom JONELLE Peoples

## 2024-03-13 NOTE — Anesthesia Preprocedure Evaluation (Signed)
 Anesthesia Evaluation  Patient identified by MRN, date of birth, ID band Patient awake    Reviewed: Allergy & Precautions, H&P , NPO status , Patient's Chart, lab work & pertinent test results  History of Anesthesia Complications Negative for: history of anesthetic complications  Airway Mallampati: II  TM Distance: >3 FB Neck ROM: Full    Dental  (+) Teeth Intact, Dental Advisory Given   Pulmonary former smoker   Pulmonary exam normal breath sounds clear to auscultation       Cardiovascular hypertension, Pt. on medications Normal cardiovascular exam Rhythm:Regular Rate:Normal     Neuro/Psych  PSYCHIATRIC DISORDERS Anxiety Depression    negative neurological ROS     GI/Hepatic Neg liver ROS,GERD  Medicated and Controlled,,  Endo/Other  BMI 33  Renal/GU negative Renal ROS  negative genitourinary   Musculoskeletal negative musculoskeletal ROS (+)    Abdominal   Peds negative pediatric ROS (+)  Hematology  (+) Blood dyscrasia, anemia Hg 8.0   Anesthesia Other Findings   Reproductive/Obstetrics negative OB ROS                              Anesthesia Physical Anesthesia Plan  ASA: 2  Anesthesia Plan: General   Post-op Pain Management: Ofirmev  IV (intra-op)*   Induction: Intravenous  PONV Risk Score and Plan: 4 or greater and Ondansetron , Dexamethasone , Midazolam  and Treatment may vary due to age or medical condition  Airway Management Planned: LMA  Additional Equipment: None  Intra-op Plan:   Post-operative Plan: Extubation in OR  Informed Consent: I have reviewed the patients History and Physical, chart, labs and discussed the procedure including the risks, benefits and alternatives for the proposed anesthesia with the patient or authorized representative who has indicated his/her understanding and acceptance.     Dental advisory given  Plan Discussed with:  CRNA  Anesthesia Plan Comments: [name] is a 43 y.o. female with PMH of abnormal uterine bleeding s/p ablation, HTN, IDA, admitted for severe anemia (Hgb 5.1) requiring blood transfusion. Hgb improved to 7.1, and now awaiting hemorrhoidectomy today. Further GI workup pending.)        Anesthesia Quick Evaluation

## 2024-03-13 NOTE — Op Note (Signed)
 03/13/2024  8:46 AM  PATIENT:  [name]  43 y.o. female  Patient Care Team: Pcp, No as PCP - General  PRE-OPERATIVE DIAGNOSIS:  HEMORRHOID BLEEDING  POST-OPERATIVE DIAGNOSIS:  HEMORRHOID BLEEDING  PROCEDURE:  3 COLUMN HEMORRHOIDECTOMY, CHEMICAL SPHINCTEROTOMY, EXCISION OF PERIANAL LESION    Surgeon(s): Debby Hila, MD  ASSISTANT: none   ANESTHESIA:   local and general   Source of Specimen:  Left lateral hemorrhoid, right posterior hemorrhoid, right anterior hemorrhoid, anal condyloma  DISPOSITION OF SPECIMEN:  PATHOLOGY  COUNTS:  YES  PLAN OF CARE: Patient already admitted  PATIENT DISPOSITION:  PACU - hemodynamically stable.  INDICATION: 43 year old female with daily rectal bleeding and acute blood loss anemia.  She has been diagnosed with grade 3 hemorrhoids and hemorrhoidectomy was recommended to help curb her blood loss and treat her anemia.   OR FINDINGS: Grade 3 left lateral and right posterior internal hemorrhoids, grade 2 right anterior hemorrhoid, posterior midline anal fissure, left anterior lateral perianal condyloma  DESCRIPTION: the patient was identified in the preoperative holding area and taken to the OR where they were laid on the operating room table.  General anesthesia was induced without difficulty. The patient was then positioned in prone jackknife position with buttocks gently taped apart.  The patient was then prepped and draped in usual sterile fashion.  SCDs were noted to be in place prior to the initiation of anesthesia. A surgical timeout was performed indicating the correct patient, procedure, positioning and need for preoperative antibiotics.  A rectal block was performed using Marcaine  with epinephrine  mixed with Exparel .    I began with a digital rectal exam.  There was sphincter hypertension present.  The anal canal was gently dilated.  I then placed a Hill-Ferguson anoscope into the anal canal and evaluated this completely.  The patient  had a grade 3 left lateral hemorrhoid, grade 3 right posterior hemorrhoid, posterior midline anal fissure (shallow), and a right anterior grade 2 hemorrhoid.  I began with the largest hemorrhoid which was the left lateral hemorrhoid.  This was elevated off of the sphincter complex and the skin was sized using a 10 blade scalpel.  Dissection was carried down under the hemorrhoid using Metzenbaum scissors and blunt dissection.  The sphincter complex was identified and preserved.  The remaining tissue above this was removed down to the level of the rectal mucosa.  The hemorrhoidal artery was suture-ligated using a 2-0 chromic suture and brought up to the level of the dentate line in a running fashion.  The remaining subcutaneous hemorrhoidal tissue in the perianal area was dissected using Metzenbaum scissors.  The perianal skin was then closed with a running 3-0 chromic suture.  I then turned my attention to the next largest hemorrhoid, which was the right posterior hemorrhoid.  This was divided in similar fashion and closed using a 2-0 chromic suture internally and a 3-0 chromic suture externally.  I then removed and closed the right anterior hemorrhoid in similar fashion.  I then performed a shave biopsy of the 1 cm perianal lesion that appeared to be an anal condyloma.  I then inspected the anal canal for hemostasis.  There was no active bleeding noted.  I injected the remainder of the local anesthetic around the hemorrhoidectomy site.  I then injected 100 units of Botox  into the intersphincteric groove to allow for healing of the posterior midline anal fissure.  A Gelfoam sponge was placed into the anal canal for postoperative hemostasis.  A dressing was  applied.  The patient was then awakened from anesthesia and sent to the post anesthesia care unit in stable condition.  All counts were correct per operating room staff.  Marilyn JAYSON Ned, MD  Colorectal and General Surgery [facility]

## 2024-03-14 ENCOUNTER — Encounter (HOSPITAL_COMMUNITY): Payer: Self-pay | Admitting: Family Medicine

## 2024-03-14 DIAGNOSIS — D649 Anemia, unspecified: Secondary | ICD-10-CM | POA: Diagnosis not present

## 2024-03-14 DIAGNOSIS — K642 Third degree hemorrhoids: Secondary | ICD-10-CM | POA: Diagnosis not present

## 2024-03-14 DIAGNOSIS — G8918 Other acute postprocedural pain: Secondary | ICD-10-CM | POA: Insufficient documentation

## 2024-03-14 LAB — CBC
HCT: 25 % — ABNORMAL LOW (ref 36.0–46.0)
Hemoglobin: 8 g/dL — ABNORMAL LOW (ref 12.0–15.0)
MCH: 27.8 pg (ref 26.0–34.0)
MCHC: 32 g/dL (ref 30.0–36.0)
MCV: 86.8 fL (ref 80.0–100.0)
Platelets: 321 K/uL (ref 150–400)
RBC: 2.88 MIL/uL — ABNORMAL LOW (ref 3.87–5.11)
RDW: 17.2 % — ABNORMAL HIGH (ref 11.5–15.5)
WBC: 14.9 K/uL — ABNORMAL HIGH (ref 4.0–10.5)
nRBC: 0.1 % (ref 0.0–0.2)

## 2024-03-14 MED ORDER — OXYCODONE HCL 5 MG PO TABS: 5.0000 mg | ORAL_TABLET | ORAL | Status: DC | PRN

## 2024-03-14 MED ORDER — HYDROMORPHONE HCL 2 MG PO TABS
1.0000 mg | ORAL_TABLET | Freq: Once | ORAL | Status: AC | PRN
  Administered 2024-03-14: 1 mg via ORAL
  Filled 2024-03-14: qty 1

## 2024-03-14 MED ORDER — HYDROMORPHONE HCL 2 MG PO TABS: 1.0000 mg | ORAL_TABLET | Freq: Four times a day (QID) | ORAL | 0 refills | Status: DC | PRN

## 2024-03-14 MED ORDER — HYDROMORPHONE HCL 2 MG PO TABS
2.0000 mg | ORAL_TABLET | ORAL | Status: DC | PRN
  Administered 2024-03-14: 2 mg via ORAL

## 2024-03-14 MED ORDER — IBUPROFEN 200 MG PO TABS
800.0000 mg | ORAL_TABLET | Freq: Three times a day (TID) | ORAL | Status: DC | PRN
  Administered 2024-03-14: 800 mg via ORAL
  Filled 2024-03-14: qty 4

## 2024-03-14 MED ORDER — SENNA 8.6 MG PO TABS: 1.0000 | ORAL_TABLET | Freq: Every day | ORAL | 0 refills | Status: AC

## 2024-03-14 MED ORDER — HYDROMORPHONE HCL 2 MG PO TABS
1.0000 mg | ORAL_TABLET | ORAL | Status: DC | PRN
  Filled 2024-03-14: qty 1

## 2024-03-14 MED ORDER — POLYETHYLENE GLYCOL 3350 17 G PO PACK
17.0000 g | PACK | Freq: Every day | ORAL | 0 refills | Status: AC
Start: 2024-03-14 — End: ?

## 2024-03-14 MED ORDER — ACETAMINOPHEN 500 MG PO TABS
1000.0000 mg | ORAL_TABLET | Freq: Four times a day (QID) | ORAL | Status: AC
Start: 2024-03-14 — End: ?

## 2024-03-14 MED ORDER — OXYCODONE HCL 5 MG PO TABS
5.0000 mg | ORAL_TABLET | ORAL | Status: DC | PRN
  Administered 2024-03-14: 10 mg via ORAL
  Filled 2024-03-14: qty 2

## 2024-03-14 NOTE — Assessment & Plan Note (Signed)
 HTN - patient reportedly not taking blood pressure medications Menorrhagia - s/p uterine ablation June 2025

## 2024-03-14 NOTE — Plan of Care (Signed)
 Examined patient at bedside, chaperoned by Leonette Rattler, RN. Anus visualized without any purulent drainage or bleeding. Mild swelling. Sutures intact. Patient reporting 7/10 throbbing pain in the region of her anus that is keeping her from sleep. Pain is worse when she toilets. She reports oral oxycodone  takes a while to kick in and only lasts an hour, oral 1 mg dilaudid  is not strong enough but it is the only thing that helps her sleep, tylenol  and ibuprofen  are over the counter and do not work, and she has tried lidocaine  patches for her knees previously and they do not work. I counseled her on the expected course of post-operative pain, and that she will not be pain free. She has tylenol  1000 mg q6h scheduled and oxycodone  5-10 mg q4h PRN ordered, which she is getting. I gave her a one time ibuprofen  800 mg and 1 mg PO dilaudid  for her break through pain. Offered lidocaine  patch for gluteal region to attempt extra relief, but the patient declined. Recommended ice packs.   Elio Art, MD Family Medicine

## 2024-03-14 NOTE — Discharge Summary (Deleted)
 Family Medicine Teaching [facility] Discharge Summary  Patient name: [name] Medical record number: [id number] Date of birth: September 04, 1980 Age: 43 y.o. Gender: female Date of Admission: 03/11/2024  Date of Discharge: 03/14/24 Admitting Physician: Elyce Prescott, DO  Primary Care Provider: Pcp, No Consultants: General Surgery  Indication for Hospitalization: Anemia secondary to grade II Hemorrhoid   Discharge Diagnoses/Problem List:  Patient Active Problem List  Diagnosis Date Noted  Post-operative pain 03/14/2024  Grade III hemorrhoids 03/13/2024  Disposition: Home  Discharge Condition: Good  Discharge Exam:  General: well-appearing, NAD Cardiovascular: RRR, no murmurs Respiratory: no increased WOB, normal breath sounds without wheezing. Abdomen: Normoactive bowel sounds, non-distended.  Extremities: moves all equally.  Brief Hospital Course:  [name] is a 43 y.o.female with a history of severe internal and external hemorrhoids who was admitted to the [facility] Teaching Service at [facility] for anemia. Her hospital course is detailed below:  Acute on Chronic Anemia: Patient presented to the ED with a hemoglobin of 5.1.  Symptomatic with worsening fatigue and profuse bright red blood per rectum over the last few weeks.  She received 2 units of blood with improvement.  Patient underwent hemorrhoidectomy on 03/13/24. She is doing well postoperatively   Post-operative pain Patient d/o surgical pain OVN post-op. She states that Oxycodone  did not help improved pain. She responded well with Dilaudid  0.5-1 mg Q6HR PRN and Tylenol  1000 mg Q6H PRN. Recommending NSAID for additional pain control and Sitz bath Q2-3H   Other chronic conditions were medically managed with home medications and formulary alternatives as necessary   PCP Follow-up Recommendations: Continue oral iron, pt had not been taking meds at home Consider restarting HTN medication if necessary, was not taking  meds at home History of constipation - continue bowel regimen to prevent recurrent hemorrhoid  Issues for Follow Up:  Constipation  Post-op pain  Anemia  Hypertension   Significant Procedures:  Hemorrhoidectomy on 03/13/24  Significant Labs and Imaging:  Recent Labs  Lab 03/11/24 1212 03/12/24 0004 03/12/24 0357 03/12/24 1039 03/12/24 2222 03/13/24 0324 03/14/24 0455  WBC 6.9  --  6.5  --   --   --  14.9*  HGB 5.1*   < > 7.1*   < > 7.9* 8.0* 8.0*  HCT 16.8*   < > 22.0*   < > 24.4* 24.6* 25.0*  PLT 273  --  255  --   --   --  321   < > = values in this interval not displayed.   Recent Labs  Lab 03/11/24 1212  NA 140  K 3.8  CL 105  CO2 25  GLUCOSE 93  BUN 13  CREATININE 0.64  CALCIUM 8.6*  ALKPHOS 54  AST 27  ALT 24  ALBUMIN 3.3*     Results/Tests Pending at Time of Discharge: None.   Discharge Medications:  Allergies as of 03/14/2024       Reactions   Levofloxacin Itching     Med Rec must be completed prior to using this [facility]      Discharge Instructions: Please refer to Patient Instructions section of EMR for full details.  Patient was counseled important signs and symptoms that should prompt return to medical care, changes in medications, dietary instructions, activity restrictions, and follow up appointments.   Follow-Up Appointments:  Follow-up Information     Debby Hila, MD. Schedule an appointment as soon as possible for a visit in 3 week(s).   Specialties: General Surgery, Colon and Rectal  Surgery Contact information: [address] [place] [address] [phone]         [facility]. Schedule an appointment as soon as possible for a visit.   Why: Call to make an appointment ASAP for hospital follow up. Contact information: [address] [place]  [address] [phone]                Suzen Houston NOVAK, DO 03/14/2024, 10:14 AM PGY- 2, [facility]

## 2024-03-14 NOTE — Progress Notes (Signed)
     Daily Progress Note Intern Pager: [phone]  Patient name: [name] Medical record number: [id number] Date of birth: Nov 09, 1980 Age: 43 y.o. Gender: female  Primary Care Provider: Pcp, No Consultants: GI Code Status: FULL  Pt Overview and Major Events to Date:  8/29 - admitted to [facility] 8/31 : OR for Hemorrhoidectomy w/ Dr. Debby   Assessment and Plan: [name] is a 43 y.o. female with PMH of abnormal uterine bleeding s/p ablation, HTN, IDA, admitted for severe anemia (Hgb 5.1) requiring blood transfusion. She is now POD 1 s/p Hemorrhoidectomy w/ Dr. Debby   Assessment & Plan Grade III hemorrhoids S/p Hemorrhoidectomy w/ Dr. Debby. Patient is doing well this morning states most concerned is uncontrol pain from surgical site OVN. Passing gas but not yet has BM -  General Surgery consulted and follow  - Sitz bath Q 2 Hrs  - Ok from surgical standpoint to d/c home  Post-operative pain - Tylenol  1000 mg Q6H PRN  - Dilaudid  0.5-1 mg Q6HR PRN  Chronic health problem HTN - patient reportedly not taking blood pressure medications Menorrhagia - s/p uterine ablation June 2025  FEN/GI: Regular diet  PPx: SCDs Dispo:Home   Subjective:  States incision pain OVN which improved with Dilaudid  1 mg PO. States Oxycodone  does not help improved her pain. Other than pain, she state she is doing well  Objective: Temp:  [97.7 F (36.5 C)-98.8 F (37.1 C)] 98 F (36.7 C) (09/01 0828) Pulse Rate:  [71-86] 75 (09/01 0828) Resp:  [11-18] 16 (09/01 0828) BP: (127-147)/(77-90) 133/86 (09/01 0828) SpO2:  [95 %-100 %] 100 % (09/01 0828)  Physical Exam: General: well-appearing, NAD Cardiovascular: RRR, no murmurs Respiratory: no increased WOB, normal breath sounds without wheezing. Abdomen: Normoactive bowel sounds, non-distended.  Extremities: moves all equally.  Laboratory: Most recent CBC Lab Results  Component Value Date   WBC 14.9 (H) 03/14/2024   HGB 8.0 (L) 03/14/2024    HCT 25.0 (L) 03/14/2024   MCV 86.8 03/14/2024   PLT 321 03/14/2024   Most recent BMP    Latest Ref Rng & Units 03/11/2024   12:12 PM  BMP  Glucose 70 - 99 mg/dL 93   BUN 6 - 20 mg/dL 13   Creatinine 9.55 - 1.00 mg/dL 9.35   Sodium 864 - 854 mmol/L 140   Potassium 3.5 - 5.1 mmol/L 3.8   Chloride 98 - 111 mmol/L 105   CO2 22 - 32 mmol/L 25   Calcium 8.9 - 10.3 mg/dL 8.6     Suzen Elder B, DO 03/14/2024, 10:07 AM  PGY-1, [facility] FPTS Intern pager: [phone], text pages welcome Secure chat group [facility] Teaching Service

## 2024-03-14 NOTE — Progress Notes (Signed)
 MD to bedside to evaluate surgery site and severe pain.

## 2024-03-14 NOTE — Discharge Summary (Addendum)
 Family Medicine Teaching Telecare [facility] Discharge Summary  Patient name: [name] Medical record number: [id number] Date of birth: [birth year] Age: 43 y.o. Gender: female Date of Admission: 03/11/2024  Date of Discharge: 03/14/24 Admitting Physician: Elyce Prescott, DO  Primary Care Provider: Pcp, No Consultants: General Surgery   Indication for Hospitalization:  Anemia secondary to grade III Hemorrhoid   Discharge Diagnoses/Problem List:  Post-operative pain    03/14/2024 Grade III hemorrhoids 03/13/2024  Principal Problem for Admission:  Anemia d/t grade III Hemorrhoid   Other Problems addressed during stay:  Principal Problem:   Symptomatic anemia Active Problems:   Chronic health problem   Grade III hemorrhoids   Post-operative pain  Brief Hospital Course:  [name] is a 42 y.o.female with a history of severe internal and external hemorrhoids who was admitted to the [facility] Medicine Teaching Service at [facility] for symptomatic anemia. Her hospital course is detailed below:  Acute on Chronic Anemia: Patient presented to the ED with a hemoglobin of 5.1.  Symptomatic with worsening fatigue and profuse bright red blood per rectum over the last few weeks.  She received 2 units of blood with improvement.  Patient underwent hemorrhoidectomy on 8/31 as hemorrhoids thought to be the source of anemia.  Hemoglobin stable at time of discharge.  Post-operative pain Patient d/o surgical pain OVN post-op. She states that Oxycodone  did not help improved pain. She responded well with Dilaudid  1-2 mg Q6HR PRN and Tylenol  1000 mg Q6H PRN. Recommending Sitz bath Q2-3H as well for pain control along with senna and MiraLAX  to help with constipation.  Other chronic conditions were medically managed with home medications and formulary alternatives as necessary   PCP Follow-up Recommendations: Continue oral iron, pt had not been taking meds at home, reevaluate if constipating Consider  restarting HTN medication if necessary, was not taking meds at home Assess pain levels Ensure follow-up with general surgery  Results/Tests Pending at Time of Discharge:  Unresulted Labs (From admission, onward)     Start     Ordered   03/11/24 2008  Pregnancy, urine  Once,   R        03/11/24 2007           Disposition: Home  Discharge Condition: Good   Discharge Exam:  Vitals:   03/13/24 2202 03/14/24 0828  BP: (!) 140/78 133/86  Pulse: 80 75  Resp: 18 16  Temp: 98.8 F (37.1 C) 98 F (36.7 C)  SpO2: 100% 100%   Discharge Exam:  General: well-appearing, NAD Cardiovascular: RRR, no murmurs Respiratory: no increased WOB, normal breath sounds without wheezing. Abdomen: Normoactive bowel sounds, non-distended.  Extremities: moves all equally.  Significant Procedures:  Hemorrhoidectomy on 03/13/24  Significant Labs and Imaging:  Recent Labs  Lab 03/12/24 2222 03/13/24 0324 03/14/24 0455  WBC  --   --  14.9*  HGB 7.9* 8.0* 8.0*  HCT 24.4* 24.6* 25.0*  PLT  --   --  321      Latest Ref Rng & Units 03/11/2024   12:12 PM 03/04/2024   12:17 PM 12/18/2023    9:44 AM  CMP  Glucose 70 - 99 mg/dL 93  86  89   BUN 6 - 20 mg/dL 13  21  16    Creatinine 0.44 - 1.00 mg/dL 9.35  9.32  9.49   Sodium 135 - 145 mmol/L 140  138  140   Potassium 3.5 - 5.1 mmol/L 3.8  4.4  3.6   Chloride  98 - 111 mmol/L 105  102  108   CO2 22 - 32 mmol/L 25  21    Calcium 8.9 - 10.3 mg/dL 8.6  9.0    Total Protein 6.5 - 8.1 g/dL 5.8     Total Bilirubin 0.0 - 1.2 mg/dL 0.3     Alkaline Phos 38 - 126 U/L 54     AST 15 - 41 U/L 27     ALT 0 - 44 U/L 24       Pertinent Imaging  None   Discharge Medications:  Allergies as of 03/14/2024       Reactions   Levofloxacin Itching      Allergies as of 03/14/2024       Reactions   Levofloxacin Itching        Medication List     STOP taking these medications    diclofenac  75 MG EC tablet Commonly known as: VOLTAREN    ibuprofen  800  MG tablet Commonly known as: ADVIL    meloxicam  15 MG tablet Commonly known as: MOBIC    naproxen  500 MG tablet Commonly known as: NAPROSYN        TAKE these medications    acetaminophen  500 MG tablet Commonly known as: TYLENOL  Take 2 tablets (1,000 mg total) by mouth every 6 (six) hours.   Azelastine HCl 137 MCG/SPRAY Soln Place 2 sprays into the nose daily as needed (Rhinitis).   HYDROmorphone  2 MG tablet Commonly known as: DILAUDID  Take 0.5 tablets (1 mg total) by mouth every 6 (six) hours as needed for severe pain (pain score 7-10).   Myfembree  40-1-0.5 MG Tabs Generic drug: Relugolix-Estradiol -Norethind Take 1 tablet by mouth daily.   omeprazole  20 MG capsule Commonly known as: PRILOSEC Take 1 capsule (20 mg total) by mouth daily.   ondansetron  4 MG tablet Commonly known as: Zofran  Take 1 tablet (4 mg total) by mouth every 8 (eight) hours as needed for nausea or vomiting.   polyethylene glycol 17 g packet Commonly known as: MIRALAX  / GLYCOLAX  Take 17 g by mouth daily.   senna 8.6 MG Tabs tablet Commonly known as: SENOKOT Take 1 tablet (8.6 mg total) by mouth daily.        Discharge Instructions: Please refer to Patient Instructions section of EMR for full details.  Patient was counseled important signs and symptoms that should prompt return to medical care, changes in medications, dietary instructions, activity restrictions, and follow up appointments.   Follow-Up Appointments:  Follow-up Information     Debby Hila, MD. Schedule an appointment as soon as possible for a visit in 3 week(s).   Specialties: General Surgery, Colon and Rectal Surgery Contact information: [address] [place] [address] [place] [address] [phone]         [facility]. Schedule an appointment as soon as possible for a visit.   Why: Call to make an appointment ASAP for hospital follow up. Contact information: [address] [place]  [place] [address] [phone]                Coralee Arabia, DO 03/14/2024, 2:14 PM PGY-1, [facility] Family Medicine   I agree with the assessment and plan as documented above.  Stuart Redo, MD PGY-3, [facility] Family Medicine

## 2024-03-14 NOTE — Assessment & Plan Note (Signed)
 S/p Hemorrhoidectomy w/ Dr. Debby. Patient is doing well this morning states most concerned is uncontrol pain from surgical site OVN. Passing gas but not yet has BM -  General Surgery consulted and follow  - Sitz bath Q 2 Hrs  - Ok from surgical standpoint to d/c home

## 2024-03-14 NOTE — TOC Transition Note (Signed)
 Transition of Care [facility]) - Discharge Note   Patient Details  Name: [name] MRN: [id number] Date of Birth: 01-22-1981  Transition of Care [facility]) CM/SW Contact:  Roxie KANDICE Stain, RN Phone Number: 03/14/2024, 2:50 PM   Clinical Narrative:    [name] is stable to discharge home.  No TOC needs at this time.    Final next level of care: Home/Self Care Barriers to Discharge: Barriers Resolved   Patient Goals and CMS Choice Patient states their goals for this hospitalization and ongoing recovery are:: return home          Discharge Placement                 home      Discharge Plan and Services Additional resources added to the After Visit Summary for                                       Social Drivers of Health (SDOH) Interventions SDOH Screenings   Food Insecurity: No Food Insecurity (03/11/2024)  Housing: Low Risk  (03/11/2024)  Transportation Needs: No Transportation Needs (03/11/2024)  Utilities: Not At Risk (03/11/2024)  Depression (PHQ2-9): Low Risk  (08/07/2023)  Tobacco Use: Medium Risk (03/13/2024)     Readmission Risk Interventions    03/14/2024    2:50 PM  Readmission Risk Prevention Plan  Post Dischage Appt Complete  Medication Screening Complete  Transportation Screening Complete

## 2024-03-14 NOTE — Plan of Care (Signed)
  Problem: Pain Managment: Goal: General experience of comfort will improve and/or be controlled Outcome: Progressing   Problem: Skin Integrity: Goal: Risk for impaired skin integrity will decrease Outcome: Progressing   Problem: Elimination: Goal: Will not experience complications related to bowel motility Outcome: Progressing Goal: Will not experience complications related to urinary retention Outcome: Progressing

## 2024-03-14 NOTE — Assessment & Plan Note (Signed)
-   Tylenol  1000 mg Q6H PRN  - Dilaudid  0.5-1 mg Q6HR PRN

## 2024-03-14 NOTE — Progress Notes (Signed)
 Symptomatic anemia  Subjective: Pt with expected post op pain.  Nursing staff not doing Sitz baths.    Objective: Vital signs in last 24 hours: Temp:  [97.7 F (36.5 C)-98.8 F (37.1 C)] 98.8 F (37.1 C) (08/31 2202) Pulse Rate:  [71-86] 80 (08/31 2202) Resp:  [11-19] 18 (08/31 2202) BP: (127-164)/(77-90) 140/78 (08/31 2202) SpO2:  [94 %-100 %] 100 % (08/31 2202) Last BM Date : 03/10/24  Intake/Output from previous day: 08/31 0701 - 09/01 0700 In: 2425 [P.O.:960; I.V.:1415; IV Piggyback:50] Out: 110 [Blood:110] Intake/Output this shift: No intake/output data recorded.  General appearance: alert and cooperative Incision/Wound: no active bleeding  Lab Results:  Results for orders placed or performed during the hospital encounter of 03/11/24 (from the past 24 hours)  CBC     Status: Abnormal   Collection Time: 03/14/24  4:55 AM  Result Value Ref Range   WBC 14.9 (H) 4.0 - 10.5 K/uL   RBC 2.88 (L) 3.87 - 5.11 MIL/uL   Hemoglobin 8.0 (L) 12.0 - 15.0 g/dL   HCT 74.9 (L) 63.9 - 53.9 %   MCV 86.8 80.0 - 100.0 fL   MCH 27.8 26.0 - 34.0 pg   MCHC 32.0 30.0 - 36.0 g/dL   RDW 82.7 (H) 88.4 - 84.4 %   Platelets 321 150 - 400 K/uL   nRBC 0.1 0.0 - 0.2 %     Studies/Results Radiology     MEDS, Scheduled  acetaminophen   1,000 mg Oral Q6H   polyethylene glycol  17 g Oral Daily   senna  1 tablet Oral Daily     Assessment: Symptomatic anemia Grade 3 hemorrhoids  Plan: Discussed again with the patient about multi-modality pain control and the importance of Sitz baths every 2-3 hrs.  Would recommend d/c home so that she can get the appropriate care as staff does not want to do this here.  Pain Rx sent to pharmacy.  Discharge instructions written.    Will sign off.     LOS: 3 days    Bernarda JAYSON Ned, MD [facility], [place]   03/14/2024 8:26 AM

## 2024-03-15 ENCOUNTER — Other Ambulatory Visit: Payer: Self-pay

## 2024-03-15 ENCOUNTER — Ambulatory Visit: Payer: Self-pay | Admitting: Family Medicine

## 2024-03-15 ENCOUNTER — Encounter (HOSPITAL_COMMUNITY): Payer: Self-pay | Admitting: General Surgery

## 2024-03-15 DIAGNOSIS — G8929 Other chronic pain: Secondary | ICD-10-CM

## 2024-03-16 LAB — SURGICAL PATHOLOGY

## 2024-03-17 ENCOUNTER — Ambulatory Visit: Admitting: Family Medicine

## 2024-03-17 NOTE — Progress Notes (Deleted)
    SUBJECTIVE:   CHIEF COMPLAINT / HPI: Hospital f/u  Continue oral iron, pt had not been taking meds at home, reevaluate if constipating Consider restarting HTN medication if necessary, was not taking meds at home Assess pain levels Ensure follow-up with general surgery  PERTINENT  PMH / PSH: Hemorrhoids, Anemia  OBJECTIVE:   There were no vitals taken for this visit.  ***  ASSESSMENT/PLAN:   Assessment & Plan  No follow-ups on file.  Ozell Provencal, MD [facility]

## 2024-03-25 ENCOUNTER — Ambulatory Visit (INDEPENDENT_AMBULATORY_CARE_PROVIDER_SITE_OTHER)

## 2024-03-25 VITALS — BP 131/79 | HR 88 | Ht 64.0 in

## 2024-03-25 DIAGNOSIS — M79662 Pain in left lower leg: Secondary | ICD-10-CM

## 2024-03-25 DIAGNOSIS — R2242 Localized swelling, mass and lump, left lower limb: Secondary | ICD-10-CM | POA: Diagnosis not present

## 2024-03-25 DIAGNOSIS — D62 Acute posthemorrhagic anemia: Secondary | ICD-10-CM

## 2024-03-25 NOTE — Progress Notes (Signed)
     SUBJECTIVE:   CHIEF COMPLAINT / HPI:   [name] presents today for hospital follow up.   Hospitalized at [facility] from 03/11/24 to 03/14/24, for acute blood loss anemia from hemorrhoids s/p hemorrhoidectomy.  Since discharge, patient reports left calf pain and ankle swelling starting around 03/16/24. She has not been able to wear closed toe shoes on the left foot.  No SOB, CP. Ran out of dilaudid  now has 4-5 pills of oxycodone  prescribed by [facility]. Taking tylenol  regularly for pain.  She is not taking oral iron daily. She is not taking senna and miralax  daily. Not straining for bowel movements.   PERTINENT  PMH / PSH: anemia, on estrogen, hemorrhoids, knee pain  OBJECTIVE:   BP 131/79   Pulse 88   Ht 5' 4 (1.626 m)   SpO2 99%   BMI 32.44 kg/m   CV: RRR, no murmurs Resp: breathing comfortably on room air, CTAB Rectal exam: Chaperoned by Harlene Reiter. No purulent drainage or visible bleeding. There is an ulcerated lesion with granulation tissue at the base that is 0.5 cm in diameter just left of the anus. May represent where the wart was removed per patient report  Calves: Left and right calf are similar in circumference. Pain with calf squeeze on the left side only. Left ankle and foot visibly more edematous than the right ankle and foot.   ASSESSMENT/PLAN:   Assessment & Plan Acute blood loss anemia No rectal bleeding. Incisions are healing well. Recheck CBC today. Ordered ferritin for iron deficiency monitoring Stop taking ibuprofen  given bleeding risk. Pain management per surgery: Tylenol  and PRN oxycodone  She has stopped Senna and Miralax  daily, but I recommended restarting this if she has any straining or hard stools. Follow up with general surgery on 04/04/24.  Pain of left calf Localized swelling of left lower extremity New LLE swelling and pain since discharge from hospital, taking estrogen. Concerned for DVT though could be due to possible  rheumatoid arthritis, per patient report, or generalized third spacing. No SOB, CP, tachycardia concerning for PE at this time. Recommended that patient go to the ER for DVT US  and discussed risks associated with untreated DVT; however, she declined. Will not start patient on empiric therapy given her last known hemoglobin of 8.0 and recent hospitalization for acute blood loss anemia. Counseled patient that DVT could progress and lead to PE. Patient aware of ER precautions, included in AVS. Unable to schedule DVT US  OP given after 5pm. Scheduled Monday appointment with Dr. Rumbal for reevaluation of the left calf and potential DVT US  if indicated.     Marilyn Gorsline Alena Morrison, MD [facility]

## 2024-03-25 NOTE — Patient Instructions (Signed)
 I am concerned you have a deep vein thrombosis/DVT (blood clot) in your left leg that is causing the pain and swelling. I would recommend you go to the ER to have an ultrasound to look at the blood vessels in your leg. I understand you do not want to go to the ER, so I have made you an appointment to come back to our office early Monday morning to be re-evaluated and have a referral for an outpatient ultrasound if indicated then.  We discussed a risk of pulmonary embolism (blood clot in your lungs) from a DVT. Signs of lung blood clot are shortness of breath, chest pain, heart racing. If you experience any of those symptoms or if your left calf pain and swelling worsens or you get new redness and warmth in the calf, please go to the ER to be evaluated.  I will call you about your CBC and ferritin we ordered today if there need to be any changes made.  Recommend stop taking ibuprofen  due to risk of bleeding. Continue tylenol  1000 mg every six hours as needed. Continue the oxycodone  as the surgery office prescribed. Call your surgery office if your pain is unbearable with your medications. Follow up with general surgery on 04/04/24

## 2024-03-26 LAB — CBC
Hematocrit: 23.7 % — ABNORMAL LOW (ref 34.0–46.6)
Hemoglobin: 7.2 g/dL — ABNORMAL LOW (ref 11.1–15.9)
MCH: 26.7 pg (ref 26.6–33.0)
MCHC: 30.4 g/dL — ABNORMAL LOW (ref 31.5–35.7)
MCV: 88 fL (ref 79–97)
Platelets: 360 x10E3/uL (ref 150–450)
RBC: 2.7 x10E6/uL — CL (ref 3.77–5.28)
RDW: 16.4 % — ABNORMAL HIGH (ref 11.7–15.4)
WBC: 7.4 x10E3/uL (ref 3.4–10.8)

## 2024-03-26 LAB — FERRITIN: Ferritin: 8 ng/mL — ABNORMAL LOW (ref 15–150)

## 2024-03-28 ENCOUNTER — Ambulatory Visit (INDEPENDENT_AMBULATORY_CARE_PROVIDER_SITE_OTHER): Admitting: Family Medicine

## 2024-03-28 ENCOUNTER — Telehealth (HOSPITAL_COMMUNITY): Payer: Self-pay | Admitting: Pharmacy Technician

## 2024-03-28 ENCOUNTER — Telehealth (HOSPITAL_COMMUNITY): Payer: Self-pay | Admitting: Family Medicine

## 2024-03-28 ENCOUNTER — Encounter: Payer: Self-pay | Admitting: Family Medicine

## 2024-03-28 VITALS — BP 147/81 | HR 85 | Ht 64.0 in | Wt 189.8 lb

## 2024-03-28 DIAGNOSIS — D500 Iron deficiency anemia secondary to blood loss (chronic): Secondary | ICD-10-CM

## 2024-03-28 DIAGNOSIS — K642 Third degree hemorrhoids: Secondary | ICD-10-CM

## 2024-03-28 DIAGNOSIS — Z23 Encounter for immunization: Secondary | ICD-10-CM | POA: Diagnosis not present

## 2024-03-28 LAB — POCT HEMOGLOBIN: Hemoglobin: 7.7 g/dL — AB (ref 11–14.6)

## 2024-03-28 NOTE — Telephone Encounter (Signed)
 Auth Submission: NO AUTH NEEDED Site of care: [facility] Payer: [facility] Medicaid Medication & CPT/J Code(s) submitted: Feraheme (ferumoxytol ) [id number] Diagnosis Code: D50.9 Route of submission (phone, fax, portal):  Phone # Fax # Auth type: Buy/Bill HB Units/visits requested: 510mg  x 2 doses Reference number:  Approval from: 03/28/24 to 07/13/24    Dagoberto Armour, CPhT [facility] Phone: [phone] 03/28/2024

## 2024-03-28 NOTE — Patient Instructions (Signed)
 It was great to see you!  Our plans for today:  - We are referring you for IV iron infusion. Let us  know if you don't hear about an appointment in the next few weeks.  - Come back for an appointment a few weeks after your infusion, and we will repeat your labs.  - Come back sooner if your symptoms get worse or you develop new bleeding.   Take care and seek immediate care sooner if you develop any concerns.   Dr. Giovanne Nickolson

## 2024-03-28 NOTE — Progress Notes (Signed)
   SUBJECTIVE:   CHIEF COMPLAINT / HPI:   Symptomatic anemia, L calf pain - Hospitalized at [facility] from 03/11/24 to 03/14/24, for acute blood loss anemia from hemorrhoids s/p hemorrhoidectomy.  - L calf pain since 9/3, noted to have mild asymmetric swelling of L ankle and foot with positive Homan's on L at that visit. Patient declined DVT US  at that time. CBC 7.2, down from 8 that visit. - still with significant fatigue - denies CP, SOB - L calf swelling much improved from previous, denies pain.  - goes back to see Surgery 9/22. - denies rectal bleeding. - not on iron therapy   OBJECTIVE:   BP (!) 147/81   Pulse 85   Ht 5' 4 (1.626 m)   Wt 189 lb 12.8 oz (86.1 kg)   SpO2 100%   BMI 32.58 kg/m   Gen: well appearing, in NAD Card: RRR Lungs: CTAB Ext: WWP, no edema  ASSESSMENT/PLAN:   Iron deficiency anemia POC Hb stable. Patient elects to f/u with blood draw, orders placed, no phlebotomist in house today, will return for labs. Refer for IV iron. F/u 2-4 wks following infusion to recheck labs.   L calf pain Resolved.  HM - flu shot given today.   Donald CHRISTELLA Lai, DO

## 2024-03-28 NOTE — Assessment & Plan Note (Signed)
 POC Hb stable. Patient elects to f/u with blood draw, orders placed, no phlebotomist in house today, will return for labs. Refer for IV iron. F/u 2-4 wks following infusion to recheck labs.

## 2024-03-28 NOTE — Telephone Encounter (Signed)
 Patient referred to infusion pharmacy team for ambulatory infusion of IV iron.  Insurance - [place] Medicaid Prepaid Plan  Site of care  [facility] Dx code - D50.0/D50.9  IV Iron Therapy - Feraheme 510 mg Iv x 2  Infusion appointments - Scheduling team will schedule patient as soon as possible.   Seri Kimmer D. Kripa Foskey, PharmD

## 2024-03-29 ENCOUNTER — Other Ambulatory Visit

## 2024-03-29 DIAGNOSIS — D500 Iron deficiency anemia secondary to blood loss (chronic): Secondary | ICD-10-CM | POA: Diagnosis not present

## 2024-03-30 ENCOUNTER — Ambulatory Visit: Payer: Self-pay | Admitting: Family Medicine

## 2024-03-30 LAB — CBC
Hematocrit: 27.2 % — ABNORMAL LOW (ref 34.0–46.6)
Hemoglobin: 8.2 g/dL — ABNORMAL LOW (ref 11.1–15.9)
MCH: 25.8 pg — ABNORMAL LOW (ref 26.6–33.0)
MCHC: 30.1 g/dL — ABNORMAL LOW (ref 31.5–35.7)
MCV: 86 fL (ref 79–97)
Platelets: 375 x10E3/uL (ref 150–450)
RBC: 3.18 x10E6/uL — ABNORMAL LOW (ref 3.77–5.28)
RDW: 16.6 % — ABNORMAL HIGH (ref 11.7–15.4)
WBC: 6.9 x10E3/uL (ref 3.4–10.8)

## 2024-03-30 LAB — FERRITIN: Ferritin: 9 ng/mL — ABNORMAL LOW (ref 15–150)

## 2024-04-02 LAB — HEMOGLOBIN: Hemoglobin: 5.3 g/dL — CL (ref 11.1–15.9)

## 2024-04-02 LAB — SPECIMEN STATUS REPORT

## 2024-04-04 ENCOUNTER — Ambulatory Visit (HOSPITAL_COMMUNITY)
Admission: RE | Admit: 2024-04-04 | Discharge: 2024-04-04 | Disposition: A | Discharge status: Home / Self Care | Source: Ambulatory Visit | Attending: Family Medicine | Admitting: Family Medicine

## 2024-04-04 VITALS — BP 132/71 | HR 85 | Temp 97.4°F | Resp 16

## 2024-04-04 DIAGNOSIS — D500 Iron deficiency anemia secondary to blood loss (chronic): Secondary | ICD-10-CM | POA: Insufficient documentation

## 2024-04-04 MED ORDER — SODIUM CHLORIDE 0.9 % IV SOLN
510.0000 mg | Freq: Once | INTRAVENOUS | Status: AC
  Administered 2024-04-04: 510 mg via INTRAVENOUS
  Filled 2024-04-04: qty 510

## 2024-04-06 ENCOUNTER — Telehealth: Payer: Self-pay

## 2024-04-06 NOTE — Telephone Encounter (Signed)
 Patient LVM returning my call.   Called patient back. She reports having ablation in June. She reports that today she started a period. She states that this reminds her of a normal period. She is not having blood clots or heavy bleeding.   Feels that she is getting worn out fast. She returned to work yesterday, however, had to leave work early today due to symptoms.   Denies lightheadedness or dizziness.   Scheduled patient follow up examination tomorrow afternoon with Dr. Diona.   Also recommended that she reach out to [facility] regarding vaginal bleeding.   ED precautions discussed.   Chiquita JAYSON English, RN

## 2024-04-06 NOTE — Telephone Encounter (Signed)
 Patient LVM on nurse line regarding follow up from iron transfusion.   Hx of blood transfusion on 8/29, iron transfusion on 9/22.  Last Hgb of 8.2 on 03/29/24.  Patient reports that she returned to work yesterday, however, she is continuing to feel very fatigued.   She also reports that she started her period and has concerns for how this will affect her blood counts.   Attempted to return call to patient, however, she did not answer, LVM requesting that she call back to office.   Will likely need follow up appointment to recheck her labs and to possibly discuss work accommodations.   Chiquita JAYSON English, RN

## 2024-04-07 ENCOUNTER — Telehealth: Payer: Self-pay | Admitting: *Deleted

## 2024-04-07 ENCOUNTER — Encounter: Payer: Self-pay | Admitting: Obstetrics and Gynecology

## 2024-04-07 ENCOUNTER — Encounter: Payer: Self-pay | Admitting: Family Medicine

## 2024-04-07 ENCOUNTER — Ambulatory Visit: Admitting: Family Medicine

## 2024-04-07 VITALS — BP 137/84 | HR 90 | Ht 64.0 in | Wt 187.4 lb

## 2024-04-07 DIAGNOSIS — D259 Leiomyoma of uterus, unspecified: Secondary | ICD-10-CM

## 2024-04-07 DIAGNOSIS — D508 Other iron deficiency anemias: Secondary | ICD-10-CM

## 2024-04-07 MED ORDER — MEDROXYPROGESTERONE ACETATE 10 MG PO TABS: 10.0000 mg | ORAL_TABLET | Freq: Every day | ORAL | 0 refills | Status: DC

## 2024-04-07 NOTE — Telephone Encounter (Signed)
 Left message to call [facility] Triage at [phone], option 4.    Patient also left message on triage line.   Routing to Dr. Dallie to review and advise.

## 2024-04-07 NOTE — Patient Instructions (Signed)
 Thank you for visiting clinic today and allowing us  to participate in your care!  -Take Provera  daily for the next 10 days to try and reduce bleeding.  -Plan to see OBGYN as soon as possible.  -We will let you know about the results from your workup today.  -Continue iron infusions as planned.   Please schedule an appointment as needed.    Reach out any time with any questions or concerns you may have - we are here for you!  Damien Cassis, MD [facility] [phone]

## 2024-04-07 NOTE — Telephone Encounter (Signed)
 See MyChart encounter dated 04/07/24.   Encounter closed.

## 2024-04-07 NOTE — Progress Notes (Unsigned)
    SUBJECTIVE:   CHIEF COMPLAINT / HPI:   Vaginal bleeding Vaginal bleeding started yesterday. Amount of bleeding typical for her period, small clots. Prior to yesterday, had no vaginal bleeding since June ablation. Was hospitalized last month for severe anemia requiring blood and iron transfusions. Has continued to feel very fatigued, some lightheadedness.    PERTINENT  PMH / PSH: Severe IDA, uterine fibroids, hemorrhoids s/p hemorrhoidectomy   OBJECTIVE:   BP 137/84   Pulse 90   Ht 5' 4 (1.626 m)   Wt 187 lb 6.4 oz (85 kg)   SpO2 100%   BMI 32.17 kg/m   General: Well-appearing. Resting comfortably in room. CV: Normal S1/S2. No extra heart sounds. Warm and well-perfused. Pulm: Breathing comfortably on room air. CTAB. No increased WOB. Abd: Soft, non-tender, non-distended. Skin:  Warm, dry. Psych: Pleasant and appropriate.    ASSESSMENT/PLAN:   Assessment & Plan Other iron deficiency anemia Uterine leiomyoma, unspecified location Resumption of uterine bleeding post ablation suggestive of return of menstruation vs advancement of known fibroids. Bleeding concerning given recent history of severe anemia, thought to be secondary to recent hemorrhoidectomy 03/13/24. Per chart review, patient with history of multiple uterine fibroids and heavy menstrual bleeding s/p ablation, myomectomy, polypectomy 12/18/23 with remaining submucosal fibroid tissue.  - CBC, ferritin today  - Provera  10mg  x10 days  - Continue iron transfusions as planned  - Patient to see primary OBGYN as soon as possible   RTC as needed.   Damien Cassis, MD [facility]

## 2024-04-08 ENCOUNTER — Ambulatory Visit: Payer: Self-pay | Admitting: Family Medicine

## 2024-04-08 LAB — CBC
Hematocrit: 26.9 % — ABNORMAL LOW (ref 34.0–46.6)
Hemoglobin: 7.9 g/dL — ABNORMAL LOW (ref 11.1–15.9)
MCH: 26.4 pg — ABNORMAL LOW (ref 26.6–33.0)
MCHC: 29.4 g/dL — ABNORMAL LOW (ref 31.5–35.7)
MCV: 90 fL (ref 79–97)
NRBC: 2 % — ABNORMAL HIGH (ref 0–0)
Platelets: 348 x10E3/uL (ref 150–450)
RBC: 2.99 x10E6/uL — ABNORMAL LOW (ref 3.77–5.28)
RDW: 18.5 % — ABNORMAL HIGH (ref 11.7–15.4)
WBC: 7.3 x10E3/uL (ref 3.4–10.8)

## 2024-04-08 LAB — FERRITIN: Ferritin: 319 ng/mL — ABNORMAL HIGH (ref 15–150)

## 2024-04-08 NOTE — Assessment & Plan Note (Signed)
 Resumption of uterine bleeding post ablation suggestive of return of menstruation vs advancement of known fibroids. Bleeding concerning given recent history of severe anemia, thought to be secondary to recent hemorrhoidectomy 03/13/24. Per chart review, patient with history of multiple uterine fibroids and heavy menstrual bleeding s/p ablation, myomectomy, polypectomy 12/18/23 with remaining submucosal fibroid tissue.  - CBC, ferritin today  - Provera  10mg  x10 days  - Continue iron transfusions as planned  - Patient to see primary OBGYN as soon as possible

## 2024-04-11 ENCOUNTER — Encounter (HOSPITAL_COMMUNITY)
Admission: RE | Admit: 2024-04-11 | Discharge: 2024-04-11 | Disposition: A | Discharge status: Home / Self Care | Source: Ambulatory Visit | Attending: Family Medicine | Admitting: Family Medicine

## 2024-04-11 ENCOUNTER — Encounter (HOSPITAL_COMMUNITY)

## 2024-04-11 VITALS — BP 136/89 | HR 72 | Temp 98.9°F | Resp 16

## 2024-04-11 DIAGNOSIS — D500 Iron deficiency anemia secondary to blood loss (chronic): Secondary | ICD-10-CM | POA: Diagnosis present

## 2024-04-11 MED ORDER — SODIUM CHLORIDE 0.9 % IV SOLN
510.0000 mg | Freq: Once | INTRAVENOUS | Status: AC
  Administered 2024-04-11: 510 mg via INTRAVENOUS
  Filled 2024-04-11: qty 510

## 2024-04-14 NOTE — Telephone Encounter (Signed)
 Left a message for patient to check her mychart message

## 2024-04-27 ENCOUNTER — Encounter: Payer: Self-pay | Admitting: Obstetrics and Gynecology

## 2024-04-27 ENCOUNTER — Ambulatory Visit (INDEPENDENT_AMBULATORY_CARE_PROVIDER_SITE_OTHER): Admitting: Obstetrics and Gynecology

## 2024-04-27 VITALS — BP 142/82 | HR 87 | Temp 98.2°F | Wt 189.0 lb

## 2024-04-27 DIAGNOSIS — R112 Nausea with vomiting, unspecified: Secondary | ICD-10-CM

## 2024-04-27 DIAGNOSIS — N939 Abnormal uterine and vaginal bleeding, unspecified: Secondary | ICD-10-CM

## 2024-04-27 DIAGNOSIS — D259 Leiomyoma of uterus, unspecified: Secondary | ICD-10-CM | POA: Diagnosis not present

## 2024-04-27 DIAGNOSIS — D500 Iron deficiency anemia secondary to blood loss (chronic): Secondary | ICD-10-CM

## 2024-04-27 MED ORDER — NORETHINDRONE ACETATE 5 MG PO TABS: 5.0000 mg | ORAL_TABLET | Freq: Every day | ORAL | 3 refills | Status: DC

## 2024-04-27 MED ORDER — ONDANSETRON HCL 4 MG PO TABS: 4.0000 mg | ORAL_TABLET | Freq: Three times a day (TID) | ORAL | 1 refills | Status: AC | PRN

## 2024-04-27 NOTE — Progress Notes (Signed)
 43 y.o. [id number] female s/p BTL, 2016, IDA (s/p IV Fe with GI), AUB, HTN, s/p hysteroscopic myomectomy, polypectomy and endometrial ablation (12/18/23) who presents for ongoing AUB. 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   No LMP recorded. Patient has had an ablation.   After ablation, she had no cycle until 04/06/24,. Since then, it has been light but continuous Patient had a emergent hemorrhoidectomy due to ABLA requiring blood transfusion Tried ibuprofen  and provera  x10d for cycle (PCP) for AUB. Bleeding stopped last Friday 04/07/24 Hgb 7.9, IV FE with H/O ~2wk ago, f/u labs in 1 month  GYN HISTORY: Prior CD   OB History  Gravida Para Term Preterm AB Living  7 3 3  0 4 3  SAB IAB Ectopic Multiple Live Births  1 3 0 0 3    # Outcome Date GA Lbr Len/2nd Weight Sex Type Anes PTL Lv  7 Term 11/03/14 [redacted]w[redacted]d  7 lb 15 oz (3.6 kg) F CS-LTranv Spinal  LIV  6 IAB           5 IAB           4 IAB           3 SAB           2 Term     F CS-LTranv  N      Birth Comments: repeat; AICU after del for BP  1 Term    10 lb 13 oz (4.905 kg) M CS-LTranv  N LIV   Past Medical History:  Diagnosis Date   Abnormal uterine bleeding (AUB)    Anal fissure    GI--- dr leigh   Anemia 03/11/2024   Anxiety    Depression    GERD (gastroesophageal reflux disease)    EGD 03-30-2023 w/ gastritis   Heart murmur    Hemorrhoids    s/p  hemorrhoid banding ligation 10-08-2023 by dr leigh   History of trichomonal vaginitis 01/2023   Hypertension    Iron deficiency anemia due to chronic blood loss    treated w/ iron infusions;   01/ 2025 x2;   08/ 2024 x2   Seasonal allergic rhinitis    Uterine leiomyoma    Vitamin D  deficiency 11/13/2016   Past Surgical History:  Procedure Laterality Date   CESAREAN SECTION  04/22/2000   @[facility]  by dr b. nikki   CESAREAN SECTION WITH BILATERAL TUBAL LIGATION Bilateral 11/03/2014   Procedure: CESAREAN SECTION WITH BILATERAL TUBAL LIGATION;  Surgeon: Carlin DELENA Centers,  MD;  Location: [facility];  Service: Obstetrics;  Laterality: Bilateral;   CHOLECYSTECTOMY, LAPAROSCOPIC  01/26/2001   @[facility]   by dr d. blackman   COLONOSCOPY WITH ESOPHAGOGASTRODUODENOSCOPY (EGD)  03/30/2023   dr leigh   HEMORRHOID SURGERY N/A 03/13/2024   Procedure: HEMORRHOIDECTOMY;  Surgeon: Debby Hila, MD;  Location: [facility] OR;  Service: General;  Laterality: N/A;  INJECTION OF BOTOX    HYSTEROSCOPY N/A 12/18/2023   Procedure: ABLATION, ENDOMETRIUM, HYSTEROSCOPIC;  Surgeon: Dallie Vera GAILS, MD;  Location: [facility] OR;  Service: Gynecology;  Laterality: N/A;  NOVASURE   HYSTEROSCOPY WITH MYOMECTOMY N/A 12/18/2023   Procedure: HYSTEROSCOPY WITH MYOSURE MYOMECTOMY;  Surgeon: Dallie Vera GAILS, MD;  Location: [facility] OR;  Service: Gynecology;  Laterality: N/A;   INCISIONAL HERNIA REPAIR N/A 03/27/2016   Procedure: LAPAROSCOPIC ASSISTED REPAIR OF INCARCERATED  INCISIONAL HERNIA;  Surgeon: Camellia Blush, MD;  Location: [facility];  Service: General;  Laterality: N/A;   INSERTION OF MESH N/A 03/27/2016   Procedure: INSERTION OF  MESH;  Surgeon: Camellia Blush, MD;  Location: [facility];  Service: General;  Laterality: N/A;   REPEAT CESAREAN SECTION  07/10/2008   @[facility]   by dr c. rudy   Current Outpatient Medications on File Prior to Visit  Medication Sig Dispense Refill   acetaminophen  (TYLENOL ) 500 MG tablet Take 2 tablets (1,000 mg total) by mouth every 6 (six) hours.     Azelastine HCl 137 MCG/SPRAY SOLN Place 2 sprays into the nose daily as needed (Rhinitis).     omeprazole  (PRILOSEC) 20 MG capsule Take 1 capsule (20 mg total) by mouth daily. 90 capsule 3   polyethylene glycol (MIRALAX  / GLYCOLAX ) 17 g packet Take 17 g by mouth daily. 14 each 0   senna (SENOKOT) 8.6 MG TABS tablet Take 1 tablet (8.6 mg total) by mouth daily. 120 tablet 0   No current facility-administered medications on file prior to visit.   Allergies  Allergen Reactions   Levofloxacin Itching      PE Today's Vitals   04/27/24 1144  BP: (!)  142/82  Pulse: 87  Temp: 98.2 F (36.8 C)  TempSrc: Oral  SpO2: 96%  Weight: 189 lb (85.7 kg)   Body mass index is 32.44 kg/m.  Physical Exam Vitals reviewed.  Constitutional:      General: She is not in acute distress.    Appearance: Normal appearance.  HENT:     Head: Normocephalic and atraumatic.     Nose: Nose normal.  Eyes:     Extraocular Movements: Extraocular movements intact.     Conjunctiva/sclera: Conjunctivae normal.  Pulmonary:     Effort: Pulmonary effort is normal.  Musculoskeletal:        General: Normal range of motion.     Cervical back: Normal range of motion.  Neurological:     General: No focal deficit present.     Mental Status: She is alert.  Psychiatric:        Mood and Affect: Mood normal.        Behavior: Behavior normal.      Assessment and Plan:        Abnormal uterine bleeding (AUB) Uterine leiomyoma, unspecified location Iron deficiency anemia due to chronic blood loss -     Norethindrone  Acetate; Take 1 tablet (5 mg total) by mouth daily for 5 days.  Dispense: 90 tablet; Refill: 3  Unhappy with ongoing bleeding Concerned that 2wk cycles will persist Discussed 6 month window to determine effectiveness of ablation, 70% sucess Offered exp mmg or hormonal therapy, Desire hormonal therapy Start every day aygestin , may titrate with cycles RTO for annual  Nausea and vomiting, unspecified vomiting type -     Ondansetron  HCl; Take 1 tablet (4 mg total) by mouth every 8 (eight) hours as needed for nausea or vomiting.  Dispense: 30 tablet; Refill: 1  Vera LULLA Pa, MD

## 2024-05-04 ENCOUNTER — Ambulatory Visit: Admitting: Obstetrics and Gynecology

## 2024-05-06 ENCOUNTER — Other Ambulatory Visit: Payer: Self-pay | Admitting: Family Medicine

## 2024-05-10 NOTE — Progress Notes (Unsigned)
 Office Visit Note  Patient: [name]             Date of Birth: 01-30-1981           MRN: [id number]             PCP: Alba Sharper, MD Referring: Cleatrice Ludie SAUNDERS, MD Visit Date: 05/12/2024 Occupation: @GUAROCC @  Subjective:  No chief complaint on file.   Discussed the use of AI scribe software for clinical note transcription with the patient, who gave verbal consent to proceed.  History of Present Illness [name] is a 43 year old female who presents with knee pain and swelling for evaluation of possible rheumatoid arthritis. She was referred by Dr. Cleatrice for evaluation of rheumatoid arthritis after blood work indicated a positive rheumatoid factor.  She has experienced knee pain since 2018, initially noted when bending down, accompanied by a popping sensation. 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 The pain is described as sharp and burning, particularly after working, and stiffness improves with rest. Swelling is present in both knees, with pain in the front and back, and she has not seen her knees 'regular in a long time.'  She experiences pain, numbness, and tingling in both arms, which are exacerbated by activities such as writing or scrolling on her phone. The pain extends from her hands up her arms, and she reports difficulty completing tasks that require prolonged use of her hands, such as filling out paperwork.  She mentions a history of plantar fasciitis in one foot. Additionally, she experiences muscle cramps, described as 'Charlie horse cramps,' in a consistent location, leaving the area sore for days.  Her family history is significant for her father having rheumatoid arthritis in both knees, requiring shots for pain management.  She was previously prescribed meloxicam  for pain management, which she found helpful, but has not been  able to obtain a refill since being referred to the current clinic. She is currently taking Aygestin  to manage fibroid-related bleeding, which has stopped her periods.  No new rashes, muscle weakness, or joint pain in other areas besides her knees and hands. Her shoulders are okay, but holding her arms up for extended periods causes pain. She also denies any recent injuries or illnesses that could have contributed to her symptoms.     Activities of Daily Living:  Patient reports morning stiffness for 1-2 hours.   Patient Reports nocturnal pain.  Difficulty dressing/grooming: Denies Difficulty climbing stairs: Denies Difficulty getting out of chair: Reports Difficulty using hands for taps, buttons, cutlery, and/or writing: Reports  Review of Systems  Constitutional:  Positive for fatigue.  HENT:  Positive for mouth dryness. Negative for mouth sores.   Eyes:  Negative for dryness.  Respiratory:  Negative for shortness of breath.   Cardiovascular:  Negative for chest pain and palpitations.  Gastrointestinal:  Negative for blood in stool, constipation and diarrhea.  Endocrine: Negative for increased urination.  Genitourinary:  Negative for involuntary urination.  Musculoskeletal:  Positive for joint pain, gait problem, joint pain, joint swelling, myalgias, muscle weakness, morning stiffness, muscle tenderness and myalgias.  Skin:  Positive for hair loss. Negative for color change, rash and sensitivity to sunlight.  Allergic/Immunologic: Negative for susceptible to infections.  Neurological:  Negative for dizziness and headaches.  Hematological:  Negative for swollen glands.  Psychiatric/Behavioral:  Positive for sleep disturbance. Negative for  depressed mood. The patient is not nervous/anxious.      Rheum History: # Diagnosed in ***.  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   Hypertension 02/08/2023   Iron deficiency anemia 02/05/2023   Dysmenorrhea 05/08/2022   External hemorrhoid 01/02/2021   Knee pain, bilateral 09/24/2016    Past Medical History:  Diagnosis Date   Abnormal uterine bleeding (AUB)    Anal fissure    GI--- dr leigh   Anemia 03/11/2024   Anxiety    Depression    GERD (gastroesophageal reflux disease)    EGD 03-30-2023 w/ gastritis   Heart murmur    Hemorrhoids    s/p  hemorrhoid banding ligation 10-08-2023 by dr leigh   History of trichomonal vaginitis 01/2023   Hypertension    Iron deficiency anemia due to chronic blood loss    treated w/ iron infusions;   01/ 2025 x2;   08/ 2024 x2   Seasonal allergic rhinitis    Uterine leiomyoma    Vitamin D  deficiency 11/13/2016    Family History  Problem Relation Age of Onset   Diabetes Father    Hypertension Father    Hyperlipidemia Father    Cancer Maternal Aunt        breast   Hypertension Paternal Grandmother    Stroke Paternal Grandmother    Stomach cancer Neg Hx    Esophageal cancer Neg Hx    Colon cancer Neg Hx    Rectal cancer Neg Hx    Past Surgical History:  Procedure Laterality Date   CESAREAN SECTION  04/22/2000   @[facility]  by dr b. nikki   CESAREAN SECTION WITH BILATERAL TUBAL LIGATION Bilateral 11/03/2014   Procedure: CESAREAN SECTION WITH BILATERAL TUBAL LIGATION;  Surgeon: Carlin DELENA Centers, MD;  Location: [facility];  Service: Obstetrics;  Laterality: Bilateral;   CHOLECYSTECTOMY, LAPAROSCOPIC  01/26/2001   @[facility]   by dr d. blackman    COLONOSCOPY WITH ESOPHAGOGASTRODUODENOSCOPY (EGD)  03/30/2023   dr leigh   HEMORRHOID SURGERY N/A 03/13/2024   Procedure: HEMORRHOIDECTOMY;  Surgeon: Debby Hila, MD;  Location: [facility] OR;  Service: General;  Laterality: N/A;  INJECTION OF BOTOX    HYSTEROSCOPY N/A 12/18/2023   Procedure: ABLATION, ENDOMETRIUM, HYSTEROSCOPIC;  Surgeon: Dallie Vera GAILS, MD;  Location: [facility] OR;  Service: Gynecology;  Laterality: N/A;  NOVASURE   HYSTEROSCOPY WITH MYOMECTOMY N/A 12/18/2023   Procedure: HYSTEROSCOPY WITH MYOSURE MYOMECTOMY;  Surgeon: Dallie Vera GAILS, MD;  Location: [facility] OR;  Service: Gynecology;  Laterality: N/A;   INCISIONAL HERNIA REPAIR N/A 03/27/2016   Procedure: LAPAROSCOPIC ASSISTED REPAIR OF INCARCERATED  INCISIONAL HERNIA;  Surgeon: Camellia Blush, MD;  Location: [facility];  Service: General;  Laterality: N/A;   INSERTION OF MESH N/A 03/27/2016   Procedure: INSERTION OF MESH;  Surgeon: Camellia Blush, MD;  Location: [facility];  Service: General;  Laterality: N/A;   REPEAT CESAREAN SECTION  07/10/2008   @[facility]   by dr c. centers   Social History   Social History Narrative  Not on file   Immunization History  Administered Date(s) Administered   Influenza, Seasonal, Injecte, Preservative Fre 04/08/2023, 03/28/2024   Influenza,inj,Quad PF,6+ Mos 09/08/2022   Moderna Sars-Covid-2 Vaccination 11/30/2019, 12/28/2019   Pfizer(Comirnaty)Fall Seasonal Vaccine 12 years and older 04/08/2023   Pneumococcal Polysaccharide-23 11/04/2014   Tdap 11/04/2014     Objective: Vital Signs: There were no vitals taken for this visit.   Physical Exam Vitals and nursing note reviewed.  HENT:     Head: Normocephalic and atraumatic.     Nose: Nose normal.  Eyes:     Conjunctiva/sclera: Conjunctivae normal.     Pupils: Pupils are equal, round, and reactive to light.  Cardiovascular:     Rate and Rhythm: Normal rate and regular rhythm.     Heart sounds: Normal heart sounds.  Pulmonary:     Effort: Pulmonary effort is  normal.     Breath sounds: Normal breath sounds.  Skin:    General: Skin is warm and dry.  Neurological:     Mental Status: She is alert. Mental status is at baseline.  Psychiatric:        Mood and Affect: Mood normal.        Behavior: Behavior normal.      Musculoskeletal Exam: ***  CDAI Exam: CDAI Score: -- Patient Global: --; Provider Global: -- Swollen: --; Tender: -- Joint Exam 05/12/2024   No joint exam has been documented for this visit   There is currently no information documented on the homunculus. Go to the Rheumatology activity and complete the homunculus joint exam.  Investigation: No additional findings.  Imaging: No results found.  Recent Labs: Lab Results  Component Value Date   WBC 7.3 04/07/2024   HGB 7.9 (L) 04/07/2024   PLT 348 04/07/2024   NA 140 03/11/2024   K 3.8 03/11/2024   CL 105 03/11/2024   CO2 25 03/11/2024   GLUCOSE 93 03/11/2024   BUN 13 03/11/2024   CREATININE 0.64 03/11/2024   BILITOT 0.3 03/11/2024   ALKPHOS 54 03/11/2024   AST 27 03/11/2024   ALT 24 03/11/2024   PROT 5.8 (L) 03/11/2024   ALBUMIN 3.3 (L) 03/11/2024   CALCIUM 8.6 (L) 03/11/2024   GFRAA >60 03/21/2016   Lab Results  Component Value Date   ANA Negative 03/10/2024   RF 67.8 (H) 03/04/2024    Speciality Comments: No specialty comments available.  Procedures:  No procedures performed Allergies: Levofloxacin   Assessment / Plan:     Visit Diagnoses: No diagnosis found.  #High risk medication use  Orders: No orders of the defined types were placed in this encounter.  No orders of the defined types were placed in this encounter.   I personally spent a total of *** minutes in the care of the patient today including {Time Based Coding:210964241}.  Follow-Up Instructions: No follow-ups on file.   Asberry Claw, DO

## 2024-05-12 ENCOUNTER — Other Ambulatory Visit: Payer: Self-pay

## 2024-05-12 ENCOUNTER — Ambulatory Visit: Admitting: Family Medicine

## 2024-05-12 ENCOUNTER — Ambulatory Visit
Admission: RE | Admit: 2024-05-12 | Discharge: 2024-05-12 | Disposition: A | Discharge status: Home / Self Care | Source: Ambulatory Visit

## 2024-05-12 ENCOUNTER — Ambulatory Visit

## 2024-05-12 ENCOUNTER — Other Ambulatory Visit: Payer: Self-pay | Admitting: Gastroenterology

## 2024-05-12 ENCOUNTER — Ambulatory Visit: Admission: RE | Admit: 2024-05-12 | Source: Ambulatory Visit

## 2024-05-12 VITALS — BP 129/81 | HR 81 | Temp 98.6°F | Resp 12 | Ht 63.0 in | Wt 193.6 lb

## 2024-05-12 VITALS — BP 142/80 | HR 95 | Ht 63.0 in | Wt 194.6 lb

## 2024-05-12 DIAGNOSIS — M25562 Pain in left knee: Secondary | ICD-10-CM | POA: Diagnosis not present

## 2024-05-12 DIAGNOSIS — M25561 Pain in right knee: Secondary | ICD-10-CM | POA: Insufficient documentation

## 2024-05-12 DIAGNOSIS — R252 Cramp and spasm: Secondary | ICD-10-CM | POA: Diagnosis not present

## 2024-05-12 DIAGNOSIS — D500 Iron deficiency anemia secondary to blood loss (chronic): Secondary | ICD-10-CM

## 2024-05-12 DIAGNOSIS — R7689 Other specified abnormal immunological findings in serum: Secondary | ICD-10-CM | POA: Insufficient documentation

## 2024-05-12 NOTE — Progress Notes (Signed)
    SUBJECTIVE:   CHIEF COMPLAINT / HPI:   AUB S/p ablation in 12/2023, now on norethindrone  5 mg daily per GYN visit on 04/27/2024.  Currently receiving IV iron infusions outpatient, most recently on 04/11/2024.  Reports her bleeding has now stopped after starting the Aygestin  2 weeks ago.  Denies any other sources of bleeding including hematuria or hematemesis.  Currently taking meloxicam  for pain, reviewed endoscopy report which showed gastritis and recommended against NSAID use.  PERTINENT  PMH / PSH: HTN, AUB  OBJECTIVE:   BP (!) 142/80   Pulse 95   Ht 5' 3 (1.6 m)   Wt 194 lb 9.6 oz (88.3 kg)   SpO2 98%   BMI 34.47 kg/m    General: NAD, pleasant, able to participate in exam Cardiac: RRR, no murmurs. Respiratory: CTAB, normal effort, No wheezes, rales or rhonchi Extremities: no edema or cyanosis. Skin: warm and dry, no rashes noted Neuro: alert, no obvious focal deficits Psych: Normal affect and mood  ASSESSMENT/PLAN:   Assessment & Plan Iron deficiency anemia due to chronic blood loss Vaginal bleeding now stopped on progesterone therapy per GYN, will reassess blood work today.  Normocytic anemia, will also obtain B12 given possible mixed anemia picture. -CBC, ferritin, B12 Muscle cramps Seen by rheumatology earlier today, instructed to obtain blood work at her appointment here.  Reordered and will forward to Dr. Luba.  Requested meloxicam  refill, prior endoscopy reviewed recommended avoiding NSAID use due to persistent gastritis therefore recommended other supportive care. - Anti-CCP, rheumatoid factor   Dr. Izetta Nap, DO [facility]

## 2024-05-12 NOTE — Assessment & Plan Note (Signed)
 Vaginal bleeding now stopped on progesterone therapy per GYN, will reassess blood work today.  Normocytic anemia, will also obtain B12 given possible mixed anemia picture. -CBC, ferritin, B12

## 2024-05-12 NOTE — Patient Instructions (Signed)
 It was wonderful to see you today! Thank you for choosing [facility].   Please bring ALL of your medications with you to every visit.   Today we talked about:  I am repeating your blood work for the anemia today we will see what your levels look like.  If they continue to be low and show signs of iron deficiency we will consider additional iron transfusions.  A muscle going to check your B12 level today to make sure it is normal. I reordered the rheumatoid arthritis labs that your rheumatologist ordered and will forward them the results when they are available. For the meloxicam  I reviewed your endoscopy report and it showed you have gastritis and GI was recommending you avoid NSAIDs.  I know this is challenging in the setting of your pain, I would recommend trying Tylenol , topical Voltaren  gel or lidocaine  patches to help with your symptoms.  Please follow up if persistent symptoms.  If you haven't already, sign up for My Chart to have easy access to your labs results, and communication with your primary care physician.   We are checking some labs today. If they are abnormal, I will call you. If they are normal, I will send you a MyChart message (if it is active) or a letter in the mail. If you do not hear about your labs in the next 2 weeks, please call the office.  Call the clinic at [phone] if your symptoms worsen or you have any concerns.  Please be sure to schedule follow up at the front desk before you leave today.   Izetta Nap, DO Family Medicine

## 2024-05-13 ENCOUNTER — Ambulatory Visit: Payer: Self-pay | Admitting: Family Medicine

## 2024-05-13 ENCOUNTER — Ambulatory Visit

## 2024-05-13 NOTE — Telephone Encounter (Signed)
 Attempted to contact patient regarding lab results, left HIPAA compliant voicemail.  Reassuringly hemoglobin significantly improved from last testing and now normal, continue follow-up with OB/GYN for management of vaginal bleeding as this is likely improved anemia.  Ferritin still low, recommend oral iron supplementation but no indication for IV iron supplementation at this time.  B12 low, recommend B12 shot weekly x 4 weeks and then transitioning to oral B12 supplementation 1000 mcg daily.  Repeat blood work in 3 months.  RA labs pending, will forward to Rheumatology when available.  Will send MyChart message with the above information.  Izetta Nap, DO

## 2024-05-15 ENCOUNTER — Encounter: Payer: Self-pay | Admitting: Obstetrics and Gynecology

## 2024-05-15 DIAGNOSIS — N939 Abnormal uterine and vaginal bleeding, unspecified: Secondary | ICD-10-CM

## 2024-05-16 ENCOUNTER — Ambulatory Visit: Admitting: Family Medicine

## 2024-05-16 ENCOUNTER — Ambulatory Visit (INDEPENDENT_AMBULATORY_CARE_PROVIDER_SITE_OTHER)

## 2024-05-16 VITALS — Ht 63.0 in | Wt 193.0 lb

## 2024-05-16 DIAGNOSIS — E538 Deficiency of other specified B group vitamins: Secondary | ICD-10-CM | POA: Diagnosis present

## 2024-05-16 DIAGNOSIS — M25559 Pain in unspecified hip: Secondary | ICD-10-CM

## 2024-05-16 MED ORDER — CYANOCOBALAMIN 1000 MCG/ML IJ SOLN
1000.0000 ug | Freq: Once | INTRAMUSCULAR | Status: AC
  Administered 2024-05-16: 1000 ug via INTRAMUSCULAR

## 2024-05-16 MED ORDER — KETOROLAC TROMETHAMINE 60 MG/2ML IM SOLN
60.0000 mg | Freq: Once | INTRAMUSCULAR | Status: AC
  Administered 2024-05-16: 60 mg via INTRAMUSCULAR

## 2024-05-16 MED ORDER — METHYLPREDNISOLONE ACETATE 40 MG/ML IJ SUSP
80.0000 mg | Freq: Once | INTRAMUSCULAR | Status: AC
  Administered 2024-05-16: 80 mg via INTRAMUSCULAR

## 2024-05-16 NOTE — Patient Instructions (Signed)
  VISIT SUMMARY: Today, we discussed your ongoing hip pain. You mentioned that the pain in your hips has been getting worse over the past two months, especially when you sleep on your side, drive, or lift your daughter. We also reviewed your history of rheumatoid arthritis and recent treatments, including steroid injections and physical therapy.  YOUR PLAN: -BILATERAL GREATER TROCHANTERIC PAIN SYNDROME: This condition involves pain on the outer side of your hips, often due to inflammation of the tendons and muscles in that area. We administered steroid injection and toradol  today to help relieve your pain temporarily. You are also referred to physical therapy to strengthen your gluteal muscles and reduce inflammation.  INSTRUCTIONS: Please follow up with physical therapy as recommended. Additionally, await the results of your blood work and x-rays from your rheumatologist for further management of your knee pain.

## 2024-05-16 NOTE — Progress Notes (Signed)
 Pt is here for a b12 injection today. #1 of #4  Date of last office visit that b12 was discussed 05/12/2024.  Injection given in RD deltoid, patient tolerated injection well.   Next injection #2 scheduled for 11/10 at 3:30pm.

## 2024-05-16 NOTE — Progress Notes (Signed)
 PCP: Alba Sharper, MD  Discussed the use of AI scribe software for clinical note transcription with the patient, who gave verbal consent to proceed.  History of Present Illness [name] is a 43 year old female with rheumatoid arthritis who presents with hip pain.  Hip pain - Sharp pain localized primarily to the lateral aspects of both hips - Pain has progressively worsened over the past two months - Exacerbated by side sleeping, driving, and lifting her daughter from daycare - Disrupts sleep, especially when lying on her side - Ibuprofen  used for pain management with partial relief - Daily stretching performed at work to alleviate symptoms - Physical therapy discontinued due to recent hospitalization and surgery  Recent interventions - Received steroid and Depo-Medrol  Toradol  injections IM which helped in the past for a couple months - No ongoing physical therapy following recent hospitalization and surgery  Rheumatologic evaluation - Recent rheumatology visit completed - Blood work and knee x-ray results are pending    Past Medical History:  Diagnosis Date   Abnormal uterine bleeding (AUB)    Anal fissure    GI--- dr leigh   Anemia 03/11/2024   Anxiety    Depression    GERD (gastroesophageal reflux disease)    EGD 03-30-2023 w/ gastritis   Heart murmur    Hemorrhoids    s/p  hemorrhoid banding ligation 10-08-2023 by dr leigh   History of trichomonal vaginitis 01/2023   Hypertension    Iron deficiency anemia due to chronic blood loss    treated w/ iron infusions;   01/ 2025 x2;   08/ 2024 x2   Seasonal allergic rhinitis    Uterine leiomyoma    Vitamin D  deficiency 11/13/2016    Current Outpatient Medications on File Prior to Visit  Medication Sig Dispense Refill   acetaminophen  (TYLENOL ) 500 MG tablet Take 2 tablets (1,000 mg total) by mouth every 6 (six) hours.     Azelastine HCl 137 MCG/SPRAY SOLN Place 2 sprays into the nose daily as needed  (Rhinitis).     norethindrone  (AYGESTIN ) 5 MG tablet Take 1 tablet (5 mg total) by mouth daily for 5 days. 90 tablet 3   omeprazole  (PRILOSEC) 20 MG capsule Take 1 capsule (20 mg total) by mouth daily. 90 capsule 3   ondansetron  (ZOFRAN ) 4 MG tablet Take 1 tablet (4 mg total) by mouth every 8 (eight) hours as needed for nausea or vomiting. 30 tablet 1   polyethylene glycol (MIRALAX  / GLYCOLAX ) 17 g packet Take 17 g by mouth daily. 14 each 0   senna (SENOKOT) 8.6 MG TABS tablet Take 1 tablet (8.6 mg total) by mouth daily. 120 tablet 0   No current facility-administered medications on file prior to visit.    Past Surgical History:  Procedure Laterality Date   CESAREAN SECTION  04/22/2000   @[facility]  by dr b. nikki   CESAREAN SECTION WITH BILATERAL TUBAL LIGATION Bilateral 11/03/2014   Procedure: CESAREAN SECTION WITH BILATERAL TUBAL LIGATION;  Surgeon: Carlin DELENA Centers, MD;  Location: [facility];  Service: Obstetrics;  Laterality: Bilateral;   CHOLECYSTECTOMY, LAPAROSCOPIC  01/26/2001   @[facility]   by dr d. blackman   COLONOSCOPY WITH ESOPHAGOGASTRODUODENOSCOPY (EGD)  03/30/2023   dr leigh   HEMORRHOID SURGERY N/A 03/13/2024   Procedure: HEMORRHOIDECTOMY;  Surgeon: Debby Hila, MD;  Location: [facility] OR;  Service: General;  Laterality: N/A;  INJECTION OF BOTOX    HYSTEROSCOPY N/A 12/18/2023   Procedure: ABLATION, ENDOMETRIUM, HYSTEROSCOPIC;  Surgeon: Dallie Vera GAILS, MD;  Location: [facility] OR;  Service: Gynecology;  Laterality: N/A;  NOVASURE   HYSTEROSCOPY WITH MYOMECTOMY N/A 12/18/2023   Procedure: HYSTEROSCOPY WITH MYOSURE MYOMECTOMY;  Surgeon: Dallie Vera GAILS, MD;  Location: [facility] OR;  Service: Gynecology;  Laterality: N/A;   INCISIONAL HERNIA REPAIR N/A 03/27/2016   Procedure: LAPAROSCOPIC ASSISTED REPAIR OF INCARCERATED  INCISIONAL HERNIA;  Surgeon: Camellia Blush, MD;  Location: [facility];  Service: General;  Laterality: N/A;   INSERTION OF MESH N/A 03/27/2016   Procedure: INSERTION OF MESH;  Surgeon: Camellia Blush,  MD;  Location: [facility];  Service: General;  Laterality: N/A;   REPEAT CESAREAN SECTION  07/10/2008   @[facility]   by dr c. rudy    Allergies  Allergen Reactions   Levofloxacin Itching    Ht 5' 3 (1.6 m)   Wt 193 lb (87.5 kg)   BMI 34.19 kg/m       No data to display              No data to display              Objective:  Physical Exam:  Gen: NAD, comfortable in exam room  Bilateral hips: No deformity. Full range of motion with 5/5 strength including abduction. Tenderness to palpation greater trochanters, hip external rotators and gluteal tendons. Neurovascularly intact distally. Negative logroll Negative faber, fadir, and piriformis stretches.   Assessment & Plan Bilateral greater trochanteric pain syndrome Chronic bilateral hip pain with gluteal tendinitis and IT band involvement. Previous IM steroid and toradol  injections provided temporary relief. - repeated these injections today - Referred to physical therapy to strengthen gluteal muscles and reduce inflammation.

## 2024-05-17 LAB — CBC
Hematocrit: 38 % (ref 34.0–46.6)
Hemoglobin: 11.9 g/dL (ref 11.1–15.9)
MCH: 30.4 pg (ref 26.6–33.0)
MCHC: 31.3 g/dL — ABNORMAL LOW (ref 31.5–35.7)
MCV: 97 fL (ref 79–97)
Platelets: 228 x10E3/uL (ref 150–450)
RBC: 3.91 x10E6/uL (ref 3.77–5.28)
RDW: 20.3 % — ABNORMAL HIGH (ref 11.7–15.4)
WBC: 6.7 x10E3/uL (ref 3.4–10.8)

## 2024-05-17 LAB — VITAMIN B12: Vitamin B-12: 230 pg/mL — ABNORMAL LOW (ref 232–1245)

## 2024-05-17 LAB — RHEUMATOID FACTOR: Rheumatoid fact SerPl-aCnc: 51.2 [IU]/mL — ABNORMAL HIGH (ref <14.0)

## 2024-05-17 LAB — CYCLIC CITRUL PEPTIDE ANTIBODY, IGG/IGA: Cyclic Citrullin Peptide Ab: >250 U — AB (ref 0–19)

## 2024-05-17 LAB — FERRITIN: Ferritin: 27 ng/mL (ref 15–150)

## 2024-05-18 ENCOUNTER — Ambulatory Visit: Payer: Self-pay

## 2024-05-23 ENCOUNTER — Ambulatory Visit (INDEPENDENT_AMBULATORY_CARE_PROVIDER_SITE_OTHER)

## 2024-05-23 DIAGNOSIS — D500 Iron deficiency anemia secondary to blood loss (chronic): Secondary | ICD-10-CM

## 2024-05-23 MED ORDER — CYANOCOBALAMIN 1000 MCG/ML IJ SOLN
1000.0000 ug | Freq: Once | INTRAMUSCULAR | Status: AC
  Administered 2024-05-23: 1000 ug via INTRAMUSCULAR

## 2024-05-23 NOTE — Progress Notes (Signed)
 Pt is here for a b12 injection today. #2 of #4   Date of last office visit that b12 was discussed 05/12/2024.   Injection given in LD deltoid, patient tolerated injection well.    Next injection #3 scheduled for 11/17 at 3:30pm.

## 2024-05-24 ENCOUNTER — Ambulatory Visit

## 2024-05-24 VITALS — BP 134/84 | HR 91 | Temp 98.2°F | Resp 16 | Ht 63.0 in | Wt 196.2 lb

## 2024-05-24 DIAGNOSIS — M059 Rheumatoid arthritis with rheumatoid factor, unspecified: Secondary | ICD-10-CM | POA: Diagnosis not present

## 2024-05-24 DIAGNOSIS — M25561 Pain in right knee: Secondary | ICD-10-CM | POA: Insufficient documentation

## 2024-05-24 DIAGNOSIS — M25562 Pain in left knee: Secondary | ICD-10-CM | POA: Diagnosis not present

## 2024-05-24 DIAGNOSIS — R7689 Other specified abnormal immunological findings in serum: Secondary | ICD-10-CM | POA: Insufficient documentation

## 2024-05-24 MED ORDER — HYDROXYCHLOROQUINE SULFATE 200 MG PO TABS: 400.0000 mg | ORAL_TABLET | Freq: Every day | ORAL | 0 refills | Status: AC

## 2024-05-24 MED ORDER — PREDNISONE 10 MG PO TABS: ORAL_TABLET | ORAL | 0 refills | Status: AC

## 2024-05-24 NOTE — Telephone Encounter (Signed)
Left message for call back to triage

## 2024-05-24 NOTE — Progress Notes (Unsigned)
 Office Visit Note  Patient: [name]             Date of Birth: Jul 31, 1980           MRN: [id number]             PCP: Alba Sharper, MD Referring: Alba Sharper, MD Visit Date: 05/24/2024 Occupation: Data Unavailable  Subjective:  No chief complaint on file.   History of Present Illness: [name] is a 43 y.o. female ***     Activities of Daily Living:  Patient reports morning stiffness for 5-10 minutes.   Patient Reports nocturnal pain.  Difficulty dressing/grooming: Denies Difficulty climbing stairs: Reports Difficulty getting out of chair: Denies Difficulty using hands for taps, buttons, cutlery, and/or writing: Reports  Review of Systems  Constitutional:  Positive for fatigue.  HENT:  Negative for mouth sores and mouth dryness.   Eyes:  Negative for dryness.  Respiratory:  Negative for shortness of breath.   Cardiovascular:  Negative for chest pain and palpitations.  Gastrointestinal:  Negative for blood in stool, constipation and diarrhea.  Endocrine: Negative for increased urination.  Genitourinary:  Negative for involuntary urination.  Musculoskeletal:  Positive for morning stiffness. Negative for joint pain, gait problem, joint pain, joint swelling, myalgias, muscle weakness, muscle tenderness and myalgias.  Skin:  Negative for color change, rash, hair loss and sensitivity to sunlight.  Allergic/Immunologic: Negative for susceptible to infections.  Neurological:  Positive for headaches. Negative for dizziness.  Hematological:  Negative for swollen glands.  Psychiatric/Behavioral:  Negative for depressed mood and sleep disturbance. The patient is not nervous/anxious.     PMFS History:  Patient Active Problem List   Diagnosis Date Noted  . Post-operative pain 03/14/2024  . Grade III hemorrhoids 03/13/2024  . Chronic health problem 03/11/2024  . Symptomatic anemia 03/11/2024  . Endometrial polyp 12/18/2023  . Murmur, cardiac 12/04/2023  . Uterine  leiomyoma 11/12/2023  . Abnormal uterine bleeding (AUB) 09/23/2023  . Greater trochanteric pain syndrome 04/08/2023  . Hypertension 02/08/2023  . Iron deficiency anemia 02/05/2023  . Dysmenorrhea 05/08/2022  . External hemorrhoid 01/02/2021  . Knee pain, bilateral 09/24/2016    Past Medical History:  Diagnosis Date  . Abnormal uterine bleeding (AUB)   . Anal fissure    GI--- dr leigh  . Anemia 03/11/2024  . Anxiety   . Depression   . GERD (gastroesophageal reflux disease)    EGD 03-30-2023 w/ gastritis  . Heart murmur   . Hemorrhoids    s/p  hemorrhoid banding ligation 10-08-2023 by dr leigh  . History of trichomonal vaginitis 01/2023  . Hypertension   . Iron deficiency anemia due to chronic blood loss    treated w/ iron infusions;   01/ 2025 x2;   08/ 2024 x2  . Seasonal allergic rhinitis   . Uterine leiomyoma   . Vitamin D  deficiency 11/13/2016    Family History  Problem Relation Age of Onset  . Rheum arthritis Father   . Diabetes Father   . Hypertension Father   . Hyperlipidemia Father   . Diabetes Paternal Grandmother   . Hypertension Paternal Grandmother   . Stroke Paternal Grandmother   . Cancer Maternal Aunt        breast  . Stomach cancer Neg Hx   . Esophageal cancer Neg Hx   . Colon cancer Neg Hx   . Rectal cancer Neg Hx    Past Surgical History:  Procedure Laterality Date  . CESAREAN SECTION  04/22/2000   @[facility]  by dr b. silva  . CESAREAN SECTION WITH BILATERAL TUBAL LIGATION Bilateral 11/03/2014   Procedure: CESAREAN SECTION WITH BILATERAL TUBAL LIGATION;  Surgeon: Carlin DELENA Centers, MD;  Location: [facility];  Service: Obstetrics;  Laterality: Bilateral;  . CHOLECYSTECTOMY, LAPAROSCOPIC  01/26/2001   @[facility]   by dr d. vernetta  . COLONOSCOPY WITH ESOPHAGOGASTRODUODENOSCOPY (EGD)  03/30/2023   dr leigh  . HEMORRHOID SURGERY N/A 03/13/2024   Procedure: HEMORRHOIDECTOMY;  Surgeon: Debby Hila, MD;  Location: [facility];  Service: General;  Laterality:  N/A;  INJECTION OF BOTOX   . HYSTEROSCOPY N/A 12/18/2023   Procedure: ABLATION, ENDOMETRIUM, HYSTEROSCOPIC;  Surgeon: Dallie Vera GAILS, MD;  Location: [facility];  Service: Gynecology;  Laterality: N/A;  NOVASURE  . HYSTEROSCOPY WITH MYOMECTOMY N/A 12/18/2023   Procedure: HYSTEROSCOPY WITH MYOSURE MYOMECTOMY;  Surgeon: Dallie Vera GAILS, MD;  Location: [facility] OR;  Service: Gynecology;  Laterality: N/A;  . INCISIONAL HERNIA REPAIR N/A 03/27/2016   Procedure: LAPAROSCOPIC ASSISTED REPAIR OF INCARCERATED  INCISIONAL HERNIA;  Surgeon: Camellia Blush, MD;  Location: [facility];  Service: General;  Laterality: N/A;  . INSERTION OF MESH N/A 03/27/2016   Procedure: INSERTION OF MESH;  Surgeon: Camellia Blush, MD;  Location: [facility];  Service: General;  Laterality: N/A;  . REPEAT CESAREAN SECTION  07/10/2008   @[facility]   by dr c. centers   Social History   Tobacco Use  . Smoking status: Former    Current packs/day: 0.00    Types: Cigarettes    Quit date: 07/15/2015    Years since quitting: 8.8    Passive exposure: Never  . Smokeless tobacco: Never  . Tobacco comments:    12-15-2023  pt stated stopped age 35,  started at age 17  Vaping Use  . Vaping status: Never Used  Substance Use Topics  . Alcohol use: No    Alcohol/week: 0.0 standard drinks of alcohol  . Drug use: Yes    Types: Marijuana    Comment: 12-15-2023  last smoked marijuana 1 week ago   Social History   Social History Narrative  . Not on file     Immunization History  Administered Date(s) Administered  . Influenza, Seasonal, Injecte, Preservative Fre 04/08/2023, 03/28/2024  . Influenza,inj,Quad PF,6+ Mos 09/08/2022  . Moderna Sars-Covid-2 Vaccination 11/30/2019, 12/28/2019  . Pfizer(Comirnaty)Fall Seasonal Vaccine 12 years and older 04/08/2023  . Pneumococcal Polysaccharide-23 11/04/2014  . Tdap 11/04/2014     Objective: Vital Signs: There were no vitals taken for this visit.   Physical Exam   Musculoskeletal Exam: ***  CDAI Exam: CDAI  Score: -- Patient Global: --; Provider Global: -- Swollen: --; Tender: -- Joint Exam 05/24/2024   No joint exam has been documented for this visit   There is currently no information documented on the homunculus. Go to the Rheumatology activity and complete the homunculus joint exam.  Investigation: No additional findings.  Imaging: DG Knee Complete 4 Views Left Result Date: 05/15/2024 CLINICAL DATA:  bilateral knee pain EXAM: LEFT KNEE - COMPLETE 4+ VIEW COMPARISON:  10/09/2016 FINDINGS: No evidence of fracture, dislocation, or joint effusion. No evidence of arthropathy or other focal bone abnormality. Soft tissues are unremarkable. IMPRESSION: No acute or significant finding by plain radiography Electronically Signed   By: CHRISTELLA.  Shick M.D.   On: 05/15/2024 09:33   DG Knee Complete 4 Views Right Result Date: 05/15/2024 CLINICAL DATA:  bilateral knee pain EXAM: RIGHT KNEE - COMPLETE 4+ VIEW COMPARISON:  05/12/2024 FINDINGS: No evidence of  fracture, dislocation, or joint effusion. No joint abnormality or arthropathy. Stable benign sclerosis of the right distal femur diaphysis medially. Soft tissues are unremarkable. IMPRESSION: No acute finding by plain radiography. Electronically Signed   By: CHRISTELLA.  Shick M.D.   On: 05/15/2024 09:32    Recent Labs: Lab Results  Component Value Date   WBC 6.7 05/12/2024   HGB 11.9 05/12/2024   PLT 228 05/12/2024   NA 140 03/11/2024   K 3.8 03/11/2024   CL 105 03/11/2024   CO2 25 03/11/2024   GLUCOSE 93 03/11/2024   BUN 13 03/11/2024   CREATININE 0.64 03/11/2024   BILITOT 0.3 03/11/2024   ALKPHOS 54 03/11/2024   AST 27 03/11/2024   ALT 24 03/11/2024   PROT 5.8 (L) 03/11/2024   ALBUMIN 3.3 (L) 03/11/2024   CALCIUM 8.6 (L) 03/11/2024   GFRAA >60 03/21/2016    Speciality Comments: No specialty comments available.  Procedures:  No procedures performed Allergies: Levofloxacin   Assessment / Plan:     Visit Diagnoses: No diagnosis  found.  Orders: No orders of the defined types were placed in this encounter.  No orders of the defined types were placed in this encounter.   Face-to-face time spent with patient was *** minutes. Greater than 50% of time was spent in counseling and coordination of care.  Follow-Up Instructions: No follow-ups on file.   Alfonso Patterson, LPN  Note - This record has been created using [employer].  Chart creation errors have been sought, but may not always  have been located. Such creation errors do not reflect on  the standard of medical care.

## 2024-05-25 ENCOUNTER — Telehealth (INDEPENDENT_AMBULATORY_CARE_PROVIDER_SITE_OTHER): Admitting: Obstetrics and Gynecology

## 2024-05-25 ENCOUNTER — Encounter: Payer: Self-pay | Admitting: Obstetrics and Gynecology

## 2024-05-25 DIAGNOSIS — N939 Abnormal uterine and vaginal bleeding, unspecified: Secondary | ICD-10-CM | POA: Diagnosis not present

## 2024-05-25 DIAGNOSIS — D500 Iron deficiency anemia secondary to blood loss (chronic): Secondary | ICD-10-CM | POA: Diagnosis not present

## 2024-05-25 DIAGNOSIS — D259 Leiomyoma of uterus, unspecified: Secondary | ICD-10-CM | POA: Diagnosis not present

## 2024-05-25 MED ORDER — NORETHINDRONE ACETATE 5 MG PO TABS: 5.0000 mg | ORAL_TABLET | Freq: Three times a day (TID) | ORAL | 0 refills | Status: DC

## 2024-05-25 NOTE — Telephone Encounter (Signed)
 Patient left detailed message on triage line expressing frustration with back and forth messages and calls, states she is headed home from work, currently taking 2 aygestin  per day, will run out of Rx because Aygestin  was written for 1 tab daily. Requesting return call from Dr. Dallie directly to further discuss.   Call returned to patient, advised message received. Explained triage process for calls and MyChart. OV or MyChart visit offered to further discuss with Dr. Dallie, patient agreeable to MyChart visit. Scheduled for today at 1345. Advised since visit is for today, can discuss refill with Dr. Dallie if appropriate.   Patient appreciative of call.   Routing FYI.   Encounter closed.

## 2024-05-25 NOTE — Progress Notes (Signed)
 43 y.o. [id number] female s/p BTL, 2016, IDA (s/p IV Fe with GI), AUB, HTN, s/p hysteroscopic myomectomy, polypectomy and endometrial ablation (12/18/23) who presents for ongoing AUB. 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  Start time: 05/25/2024  1:47 PM EST End time: 05/25/2024  1:58 PM EST I discussed the limitations of evaluation and management by telemedicine. The patient expressed understanding and agreed to proceed.  For this visit, the patient is in her home and I am in my office at the [facility]    Patient's last menstrual period was 05/21/2024.   After ablation, she had no cycle until 04/06/24,. Since then, it has been light but continuous Patient had a emergent hemorrhoidectomy due to ABLA requiring blood transfusion, stopped myfembree  2-3wk pior to this Tried ibuprofen  and provera  x10d for cycle (PCP) for AUB. Bleeding stopped last Friday 04/07/24 Hgb 7.9, IV FE with H/O ~2wk ago, f/u labs in 1 month  Seen 10/15 and started on aygestin . Bleeding started 11/2, started taking 2 tab per day. Bleeding has been consistent. Still having period like bleeding today. She had an accident after work one day.  Getting B12 and iron treatments 10/30 Hgb 11.9  GYN HISTORY: Prior CD   OB History  Gravida Para Term Preterm AB Living  7 3 3  0 4 3  SAB IAB Ectopic Multiple Live Births  1 3 0 0 3    # Outcome Date GA Lbr Len/2nd Weight Sex Type Anes PTL Lv  7 Term 11/03/14 [redacted]w[redacted]d  7 lb 15 oz (3.6 kg) F CS-LTranv Spinal  LIV  6 IAB           5 IAB           4 IAB           3 SAB           2 Term     F CS-LTranv  N      Birth Comments: repeat; AICU after del for BP  1 Term    10 lb 13 oz (4.905 kg) M CS-LTranv  N LIV   Past Medical History:  Diagnosis Date   Abnormal uterine bleeding (AUB)    Anal fissure    GI--- dr leigh   Anemia 03/11/2024   Anxiety    Depression    GERD (gastroesophageal reflux disease)    EGD 03-30-2023 w/ gastritis   Heart murmur     Hemorrhoids    s/p  hemorrhoid banding ligation 10-08-2023 by dr leigh   History of trichomonal vaginitis 01/2023   Hypertension    Iron deficiency anemia due to chronic blood loss    treated w/ iron infusions;   01/ 2025 x2;   08/ 2024 x2   Seasonal allergic rhinitis    Uterine leiomyoma    Vitamin D  deficiency 11/13/2016   Past Surgical History:  Procedure Laterality Date   CESAREAN SECTION  04/22/2000   @[facility]  by dr b. nikki   CESAREAN SECTION WITH BILATERAL TUBAL LIGATION Bilateral 11/03/2014   Procedure: CESAREAN SECTION WITH BILATERAL TUBAL LIGATION;  Surgeon: Carlin DELENA Centers, MD;  Location: [facility];  Service: Obstetrics;  Laterality: Bilateral;   CHOLECYSTECTOMY, LAPAROSCOPIC  01/26/2001   @[facility]   by dr d. blackman   COLONOSCOPY WITH ESOPHAGOGASTRODUODENOSCOPY (EGD)  03/30/2023   dr leigh   HEMORRHOID SURGERY N/A 03/13/2024   Procedure: HEMORRHOIDECTOMY;  Surgeon: Debby Hila, MD;  Location: [facility] OR;  Service: General;  Laterality: N/A;  INJECTION OF BOTOX   HYSTEROSCOPY N/A 12/18/2023   Procedure: ABLATION, ENDOMETRIUM, HYSTEROSCOPIC;  Surgeon: Dallie Vera GAILS, MD;  Location: [facility] OR;  Service: Gynecology;  Laterality: N/A;  NOVASURE   HYSTEROSCOPY WITH MYOMECTOMY N/A 12/18/2023   Procedure: HYSTEROSCOPY WITH MYOSURE MYOMECTOMY;  Surgeon: Dallie Vera GAILS, MD;  Location: [facility] OR;  Service: Gynecology;  Laterality: N/A;   INCISIONAL HERNIA REPAIR N/A 03/27/2016   Procedure: LAPAROSCOPIC ASSISTED REPAIR OF INCARCERATED  INCISIONAL HERNIA;  Surgeon: Camellia Blush, MD;  Location: [facility];  Service: General;  Laterality: N/A;   INSERTION OF MESH N/A 03/27/2016   Procedure: INSERTION OF MESH;  Surgeon: Camellia Blush, MD;  Location: [facility];  Service: General;  Laterality: N/A;   REPEAT CESAREAN SECTION  07/10/2008   @[facility]   by dr c. rudy   Current Outpatient Medications on File Prior to Visit  Medication Sig Dispense Refill   acetaminophen  (TYLENOL ) 500 MG tablet Take 2 tablets (1,000 mg  total) by mouth every 6 (six) hours.     Azelastine HCl 137 MCG/SPRAY SOLN Place 2 sprays into the nose daily as needed (Rhinitis).     omeprazole  (PRILOSEC) 20 MG capsule Take 1 capsule (20 mg total) by mouth daily. 90 capsule 3   ondansetron  (ZOFRAN ) 4 MG tablet Take 1 tablet (4 mg total) by mouth every 8 (eight) hours as needed for nausea or vomiting. 30 tablet 1   polyethylene glycol (MIRALAX  / GLYCOLAX ) 17 g packet Take 17 g by mouth daily. (Patient taking differently: Take 17 g by mouth as needed.) 14 each 0   senna (SENOKOT) 8.6 MG TABS tablet Take 1 tablet (8.6 mg total) by mouth daily. 120 tablet 0   hydroxychloroquine (PLAQUENIL) 200 MG tablet Take 2 tablets (400 mg total) by mouth daily. (Patient not taking: Reported on 05/25/2024) 180 tablet 0   predniSONE  (DELTASONE ) 10 MG tablet Take 2 tablets (20 mg total) by mouth daily with breakfast for 5 days, THEN 1.5 tablets (15 mg total) daily with breakfast for 5 days, THEN 1 tablet (10 mg total) daily with breakfast for 5 days, THEN 0.5 tablets (5 mg total) daily with breakfast for 5 days. (Patient not taking: No sig reported) 25 tablet 0   No current facility-administered medications on file prior to visit.   Allergies  Allergen Reactions   Levofloxacin Itching      PE There were no vitals filed for this visit.  There is no height or weight on file to calculate BMI.  Physical Exam Constitutional:      General: She is not in acute distress.    Appearance: Normal appearance.  HENT:     Head: Normocephalic and atraumatic.     Nose: Nose normal.  Eyes:     Extraocular Movements: Extraocular movements intact.  Pulmonary:     Effort: Pulmonary effort is normal.  Neurological:     Mental Status: She is alert.  Psychiatric:        Mood and Affect: Mood normal.        Behavior: Behavior normal.      Assessment and Plan:        Abnormal uterine bleeding (AUB) -     Norethindrone  Acetate; Take 1 tablet (5 mg total) by mouth 3  (three) times daily for 7 days.  Dispense: 21 tablet; Refill: 0  Uterine leiomyoma, unspecified location -     Norethindrone  Acetate; Take 1 tablet (5 mg total) by mouth 3 (three) times daily for 7 days.  Dispense: 21 tablet; Refill: 0  Iron deficiency anemia due to chronic blood loss -     Norethindrone  Acetate; Take 1 tablet (5 mg total) by mouth 3 (three) times daily for 7 days.  Dispense: 21 tablet; Refill: 0  Unhappy with ongoing bleeding Discussed 6 month window to determine effectiveness of ablation, 70% sucess Discussed plans for aygestin  taper, TID for 1 week, then BID for 1 week, then QD RTO in 1 month for 6 month evaluation  23 min  total time was spent for this patient encounter, including preparation, virtual counseling with the patient, coordination of care, and documentation of the encounter.  Vera LULLA Pa, MD

## 2024-05-30 ENCOUNTER — Ambulatory Visit (INDEPENDENT_AMBULATORY_CARE_PROVIDER_SITE_OTHER)

## 2024-05-30 ENCOUNTER — Other Ambulatory Visit: Payer: Self-pay

## 2024-05-30 VITALS — BP 134/82

## 2024-05-30 DIAGNOSIS — Z0130 Encounter for examination of blood pressure without abnormal findings: Secondary | ICD-10-CM | POA: Diagnosis not present

## 2024-05-30 DIAGNOSIS — E538 Deficiency of other specified B group vitamins: Secondary | ICD-10-CM

## 2024-05-30 MED ORDER — CYANOCOBALAMIN 1000 MCG/ML IJ SOLN
1000.0000 ug | Freq: Once | INTRAMUSCULAR | Status: AC
  Administered 2024-05-30: 1000 ug via INTRAMUSCULAR

## 2024-05-30 NOTE — Progress Notes (Signed)
 Pt is here for a b12 injection today.    Date of last office visit that b12 was discussed 05/12/2024  Last injection was 05/23/2024  Injection given in right deltoid, pt tolerated well and scheduled for 4th B12 injection on 06/07/24.  Per appointment note, BP check was recommended at today's visit. BP was measured in left arm. BP: 134/82.   Chiquita JAYSON English, RN

## 2024-06-01 ENCOUNTER — Ambulatory Visit: Admitting: Family Medicine

## 2024-06-01 VITALS — BP 114/64 | HR 76 | Temp 98.9°F | Wt 192.4 lb

## 2024-06-01 DIAGNOSIS — J069 Acute upper respiratory infection, unspecified: Secondary | ICD-10-CM | POA: Diagnosis present

## 2024-06-01 LAB — POC SOFIA 2 FLU + SARS ANTIGEN FIA
Influenza A, POC: NEGATIVE
Influenza B, POC: POSITIVE — AB
SARS Coronavirus 2 Ag: POSITIVE — AB

## 2024-06-01 MED ORDER — OMEPRAZOLE 20 MG PO CPDR: 20.0000 mg | DELAYED_RELEASE_CAPSULE | Freq: Every day | ORAL | 3 refills | Status: AC

## 2024-06-01 NOTE — Progress Notes (Signed)
    SUBJECTIVE:   CHIEF COMPLAINT / HPI:   Fatigue/Sore Throat - Sore throat, runny nose, cough, congestion since Sunday, no fevers - Decreased appetite, abdominal pain, and nausea, but no vomiting or diarrhea - Exposed to sick contacts at work (at [employer]) - Daughter is possibly sick as well - OTC meds: Tylenol  Cold & Flu  PERTINENT  PMH / PSH: Anxiety, Depression, HTN  OBJECTIVE:   BP 114/64   Pulse 76   Temp 98.9 F (37.2 C)   Wt 192 lb 6.4 oz (87.3 kg)   LMP 05/21/2024   BMI 34.08 kg/m   General: Awake and Alert in NAD HEENT: NCAT. Sclera anicteric. No rhinorrhea. Cardiovascular: RRR. No M/R/G Respiratory: CTAB, normal WOB on RA. No wheezing, crackles, rhonchi, or diminished breath sounds. Abdomen: Soft, non-tender, non-distended. Bowel sounds normoactive Extremities: Able to move all extremities. No BLE edema, no deformities or significant joint findings. Skin: Warm and dry. No abrasions or rashes noted. Neuro: A&Ox3. No focal neurological deficits.  ASSESSMENT/PLAN:   Assessment & Plan Upper respiratory tract infection, unspecified type Tested positive for Flu B and COVID.  Discussed supportive care and provided recommendations in her AVS.  Out of range for benefit of Tamiflu.  Follow-up as needed.  Work letter provided to be out 11/19 and 11/20.   Kathrine Melena, DO [facility]

## 2024-06-01 NOTE — Patient Instructions (Addendum)
 It was great to see you today! Thank you for choosing [facility] for your primary care. [name] was seen for viral symptoms.  Today we addressed: Likely have a virus due to weather changes and exposures to illness. This is treated supportively with Tylenol  as needed for fevers, hydration, and rest. Additional suggestions listed below.   You should return to our clinic Return if symptoms worsen or fail to improve. Please arrive 15 minutes before your appointment to ensure smooth check in process.  We appreciate your efforts in making this happen.  Thank you for allowing me to participate in your care, Kathrine Melena, DO 06/01/2024, 11:39 AM PGY-2, [facility]  The symptoms of a viral infection usually peak on day 4 to 5 of illness and then gradually improve over 10-14 days (5-7 days for adolescents). It can take 2-3 weeks for cough to completely go away.  Things you can do at home to feel better:  - Taking a warm bath, steaming up the bathroom, or using a cool mist humidifier can help with breathing - Vick's Vaporub or equivalent: rub on chest and small amount under nose at night to open nose airways  - Fever helps your body fight infection! You can take Tylenol  up to every 4-6 hours or Ibuprofen  up to every 6-8 hours for temps > 100.4  Sore Throat and Cough Treatment  - Chamomile tea has antiviral properties. - Research studies show that honey works better than cough medicine - For sore throat you can use throat lozenges, chamomile tea, honey, salt water gargling, warm drinks/broths or popsicles

## 2024-06-07 ENCOUNTER — Ambulatory Visit

## 2024-06-07 DIAGNOSIS — E538 Deficiency of other specified B group vitamins: Secondary | ICD-10-CM | POA: Diagnosis present

## 2024-06-07 MED ORDER — CYANOCOBALAMIN 1000 MCG/ML IJ SOLN
1000.0000 ug | Freq: Once | INTRAMUSCULAR | Status: AC
  Administered 2024-06-07: 1000 ug via INTRAMUSCULAR

## 2024-06-07 MED ORDER — VITAMIN B-12 1000 MCG PO TABS: 1000.0000 ug | ORAL_TABLET | Freq: Every day | ORAL | 2 refills | Status: AC

## 2024-06-07 NOTE — Addendum Note (Signed)
 Addended by: Cathe Bilger on: 06/07/2024 04:54 PM   Modules accepted: Orders

## 2024-06-07 NOTE — Progress Notes (Signed)
 Pt is here for a b12 injection today.    Date of last office visit that b12 was discussed 05/12/2024  Last injection was 05/30/2024  Injection given in left deltoid, pt tolerated well.   Per note from Dr. Theophilus, patient should now transition to oral B12 supplementation. Will forward message to provider for medication to be sent to pharmacy. Preferred [facility] is [facility] [address].   Chiquita JAYSON English, RN

## 2024-06-13 ENCOUNTER — Ambulatory Visit

## 2024-06-13 MED ORDER — NORETHINDRONE ACET-ETHINYL EST 1.5-30 MG-MCG PO TABS: ORAL_TABLET | ORAL | 0 refills | Status: DC

## 2024-06-13 NOTE — Addendum Note (Signed)
 Addended by: DALLIE BOLLARD V on: 06/13/2024 12:43 PM   Modules accepted: Orders

## 2024-06-22 ENCOUNTER — Encounter: Payer: Self-pay | Admitting: Obstetrics and Gynecology

## 2024-06-22 ENCOUNTER — Ambulatory Visit: Admitting: Obstetrics and Gynecology

## 2024-06-22 VITALS — BP 128/72 | HR 87 | Temp 98.5°F | Wt 192.0 lb

## 2024-06-22 DIAGNOSIS — D259 Leiomyoma of uterus, unspecified: Secondary | ICD-10-CM

## 2024-06-22 DIAGNOSIS — D500 Iron deficiency anemia secondary to blood loss (chronic): Secondary | ICD-10-CM | POA: Diagnosis not present

## 2024-06-22 DIAGNOSIS — N939 Abnormal uterine and vaginal bleeding, unspecified: Secondary | ICD-10-CM | POA: Diagnosis not present

## 2024-06-22 LAB — CBC
HCT: 40.1 % (ref 35.9–46.0)
Hemoglobin: 13 g/dL (ref 11.7–15.5)
MCH: 30.7 pg (ref 27.0–33.0)
MCHC: 32.4 g/dL (ref 31.6–35.4)
MCV: 94.8 fL (ref 81.4–101.7)
MPV: 12.3 fL (ref 7.5–12.5)
Platelets: 236 Thousand/uL (ref 140–400)
RBC: 4.23 Million/uL (ref 3.80–5.10)
RDW: 16.6 % — ABNORMAL HIGH (ref 11.0–15.0)
WBC: 8.7 Thousand/uL (ref 3.8–10.8)

## 2024-06-22 MED ORDER — NORETHINDRONE ACET-ETHINYL EST 1.5-30 MG-MCG PO TABS: 1.0000 | ORAL_TABLET | Freq: Every day | ORAL | 3 refills | Status: AC

## 2024-06-22 NOTE — Assessment & Plan Note (Signed)
 Hormonal work-up normal Reviewed TVUS revealing 14 cm multifibroid uterus. Failed novasure ablation, failed medical management with progestins and COC  Plan for robotic assisted total laparoscopic hysterectomy, bilateral salpingectomy, cystoscopy. Discussed outpatient procedure. Reviewed that  recovery is usually 6 weeks with additional 4 weeks of pelvic rest. Risks including infections, bleeding, and damage to surrounding organs reviewed.  Patient is agreeable to blood transfusion in the event of an emergency.  Patient in agreement with initial plan. Orders placed for surgery. RTO for preop visit.  Preop checklist: Meds and allergies reviewed. Antibiotics: Ancef , flagyl  DVT ppx: SCDs, lovenox Postop visit: 2, 6 week Additional clearance: medical Tubal and hysterectomy papers signed: yes

## 2024-06-22 NOTE — Progress Notes (Signed)
 43 y.o. [id number] female s/p CD x3 +BTL, 2016, IDA (s/p IV Fe with GI), AUB, HTN, RA (recent diagnosis), s/p hysteroscopic myomectomy, polypectomy and endometrial ablation (12/18/23) who presents for ongoing AUB. 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   Patient's last menstrual period was 05/21/2024 (approximate).   After ablation, she had no cycle until 04/06/24,. Since then, it has been light but continuous Patient had a emergent hemorrhoidectomy due to ABLA requiring blood transfusion Tried provera , aygestin  and is now on an COC taper. She desires definitive therapy via hysterectomy and has complete childbearing (prior BTL)  Surg hx: s/p CD x3 +BTL, last 2016 Lap chole, 2002 Supraumbilical hernia repair with 8cm round mesh, 03/27/2016  11/10/23 NIL, HPV neg 11/10/23 TVUS: Uterus: 13.7 x 9.5 x 7.6 cm, anteflexed uterus.  Multiple fibroids noted. Fibroids: 1-3.0 x 1.8 cm, subserosal 2-3.0 x 3.2 cm, submucosal 3-2.7 x 2.0 cm, subserosal 4-4.8 x 4.4 cm, intramural 5-3.9 x 3.4 cm, intramural Endometrial thickness: 5 mm.  Endometrium is fluid-filled.  A 2.1 x 1.3 cm vascular filling defect is noted. Left ovary: 2.1 x 1.4 x 1.3 cm, normal-appearing. Right ovary: 1.9 x 1.1 x 1.4 cm, normal-appearing. No free fluid.   Impression:  Multifibroid uterus.  The largest is 4.8 cm. Endometrial polyp measuring 2.1 cm.  12/18/23 path: A. ENDOMETRIUM, POLYPECTOMY:  - Endometrial polyp.  - Proliferative endometrium.  - Fragments of myometrium compatible with leiomyoma.  - Negative for atypia/EIN and malignancy.   GYN HISTORY: Prior CD   OB History  Gravida Para Term Preterm AB Living  7 3 3  0 4 3  SAB IAB Ectopic Multiple Live Births  1 3 0 0 3    # Outcome Date GA Lbr Len/2nd Weight Sex Type Anes PTL Lv  7 Term 11/03/14 [redacted]w[redacted]d  7 lb 15 oz (3.6 kg) F CS-LTranv Spinal  LIV  6 IAB           5 IAB           4 IAB           3 SAB           2 Term     F CS-LTranv  N      Birth Comments:  repeat; AICU after del for BP  1 Term    10 lb 13 oz (4.905 kg) M CS-LTranv  N LIV   Past Medical History:  Diagnosis Date   Abnormal uterine bleeding (AUB)    Anal fissure    GI--- dr leigh   Anemia 03/11/2024   Anxiety    Depression    GERD (gastroesophageal reflux disease)    EGD 03-30-2023 w/ gastritis   Heart murmur    Hemorrhoids    s/p  hemorrhoid banding ligation 10-08-2023 by dr leigh   History of trichomonal vaginitis 01/2023   Hypertension    Iron deficiency anemia due to chronic blood loss    treated w/ iron infusions;   01/ 2025 x2;   08/ 2024 x2   Seasonal allergic rhinitis    Uterine leiomyoma    Vitamin D  deficiency 11/13/2016   Past Surgical History:  Procedure Laterality Date   CESAREAN SECTION  04/22/2000   @[facility]  by dr b. nikki   CESAREAN SECTION WITH BILATERAL TUBAL LIGATION Bilateral 11/03/2014   Procedure: CESAREAN SECTION WITH BILATERAL TUBAL LIGATION;  Surgeon: Carlin DELENA Centers, MD;  Location: [facility] ORS;  Service: Obstetrics;  Laterality: Bilateral;   CHOLECYSTECTOMY, LAPAROSCOPIC  01/26/2001   @  [facility]  by dr d. vernetta   COLONOSCOPY WITH ESOPHAGOGASTRODUODENOSCOPY (EGD)  03/30/2023   dr leigh   HEMORRHOID SURGERY N/A 03/13/2024   Procedure: HEMORRHOIDECTOMY;  Surgeon: Debby Hila, MD;  Location: [facility] OR;  Service: General;  Laterality: N/A;  INJECTION OF BOTOX    HYSTEROSCOPY N/A 12/18/2023   Procedure: ABLATION, ENDOMETRIUM, HYSTEROSCOPIC;  Surgeon: Dallie Vera GAILS, MD;  Location: [facility] OR;  Service: Gynecology;  Laterality: N/A;  NOVASURE   HYSTEROSCOPY WITH MYOMECTOMY N/A 12/18/2023   Procedure: HYSTEROSCOPY WITH MYOSURE MYOMECTOMY;  Surgeon: Dallie Vera GAILS, MD;  Location: [facility] OR;  Service: Gynecology;  Laterality: N/A;   INCISIONAL HERNIA REPAIR N/A 03/27/2016   Procedure: LAPAROSCOPIC ASSISTED REPAIR OF INCARCERATED  INCISIONAL HERNIA;  Surgeon: Camellia Blush, MD;  Location: [facility] ORS;  Service: General;  Laterality: N/A;   INSERTION OF MESH N/A  03/27/2016   Procedure: INSERTION OF MESH;  Surgeon: Camellia Blush, MD;  Location: [facility] ORS;  Service: General;  Laterality: N/A;   REPEAT CESAREAN SECTION  07/10/2008   @[facility]   by dr c. rudy   Current Outpatient Medications on File Prior to Visit  Medication Sig Dispense Refill   acetaminophen  (TYLENOL ) 500 MG tablet Take 2 tablets (1,000 mg total) by mouth every 6 (six) hours.     Azelastine HCl 137 MCG/SPRAY SOLN Place 2 sprays into the nose daily as needed (Rhinitis).     cyanocobalamin  (VITAMIN B12) 1000 MCG tablet Take 1 tablet (1,000 mcg total) by mouth daily. Please take on empty stomach. 30 tablet 2   hydroxychloroquine  (PLAQUENIL ) 200 MG tablet Take 2 tablets (400 mg total) by mouth daily. 180 tablet 0   omeprazole  (PRILOSEC) 20 MG capsule Take 1 capsule (20 mg total) by mouth daily. 90 capsule 3   ondansetron  (ZOFRAN ) 4 MG tablet Take 1 tablet (4 mg total) by mouth every 8 (eight) hours as needed for nausea or vomiting. 30 tablet 1   polyethylene glycol (MIRALAX  / GLYCOLAX ) 17 g packet Take 17 g by mouth daily. 14 each 0   senna (SENOKOT) 8.6 MG TABS tablet Take 1 tablet (8.6 mg total) by mouth daily. 120 tablet 0   No current facility-administered medications on file prior to visit.   Allergies  Allergen Reactions   Levofloxacin Itching      PE Today's Vitals   06/22/24 1506  BP: 128/72  Pulse: 87  Temp: 98.5 F (36.9 C)  TempSrc: Oral  Weight: 192 lb (87.1 kg)   Body mass index is 34.01 kg/m.  Physical Exam Vitals reviewed.  Constitutional:      General: She is not in acute distress.    Appearance: Normal appearance.  HENT:     Head: Normocephalic and atraumatic.     Nose: Nose normal.  Eyes:     Extraocular Movements: Extraocular movements intact.     Conjunctiva/sclera: Conjunctivae normal.  Pulmonary:     Effort: Pulmonary effort is normal.  Musculoskeletal:        General: Normal range of motion.     Cervical back: Normal range of motion.   Neurological:     General: No focal deficit present.     Mental Status: She is alert.  Psychiatric:        Mood and Affect: Mood normal.        Behavior: Behavior normal.      Assessment and Plan:        Abnormal uterine bleeding (AUB) Assessment & Plan: Hormonal work-up normal Reviewed TVUS revealing 14 cm  multifibroid uterus. Failed novasure ablation, failed medical management with progestins and COC  Plan for robotic assisted total laparoscopic hysterectomy, bilateral salpingectomy, cystoscopy. Discussed outpatient procedure. Reviewed that  recovery is usually 6 weeks with additional 4 weeks of pelvic rest. Risks including infections, bleeding, and damage to surrounding organs reviewed.  Patient is agreeable to blood transfusion in the event of an emergency.  Patient in agreement with initial plan. Orders placed for surgery. RTO for preop visit.  Preop checklist: Meds and allergies reviewed. Antibiotics: Ancef , flagyl  DVT ppx: SCDs, lovenox Postop visit: 2, 6 week Additional clearance: medical Tubal and hysterectomy papers signed: yes    Orders: -     CBC -     Ambulatory Referral For Surgery Scheduling -     Norethindrone  Acet-Ethinyl Est; Take 1 tablet by mouth daily. Skip placebo pills in last row of pack.  Dispense: 84 tablet; Refill: 3  Iron deficiency anemia due to chronic blood loss -     CBC  Uterine leiomyoma, unspecified location -     Ambulatory Referral For Surgery Scheduling   Vera LULLA Pa, MD

## 2024-06-23 ENCOUNTER — Ambulatory Visit (INDEPENDENT_AMBULATORY_CARE_PROVIDER_SITE_OTHER): Admitting: Family Medicine

## 2024-06-23 ENCOUNTER — Encounter: Payer: Self-pay | Admitting: Family Medicine

## 2024-06-23 ENCOUNTER — Ambulatory Visit: Payer: Self-pay | Admitting: Obstetrics and Gynecology

## 2024-06-23 VITALS — BP 125/74 | HR 81 | Temp 98.8°F | Ht 63.0 in | Wt 193.4 lb

## 2024-06-23 DIAGNOSIS — U071 COVID-19: Secondary | ICD-10-CM

## 2024-06-23 DIAGNOSIS — J101 Influenza due to other identified influenza virus with other respiratory manifestations: Secondary | ICD-10-CM

## 2024-06-23 DIAGNOSIS — B349 Viral infection, unspecified: Secondary | ICD-10-CM

## 2024-06-23 DIAGNOSIS — R059 Cough, unspecified: Secondary | ICD-10-CM | POA: Diagnosis not present

## 2024-06-23 LAB — POC SOFIA 2 FLU + SARS ANTIGEN FIA
Influenza A, POC: NEGATIVE
Influenza B, POC: POSITIVE — AB
SARS Coronavirus 2 Ag: POSITIVE — AB

## 2024-06-23 MED ORDER — OSELTAMIVIR PHOSPHATE 75 MG PO CAPS: 75.0000 mg | ORAL_CAPSULE | Freq: Two times a day (BID) | ORAL | 0 refills | Status: AC

## 2024-06-23 NOTE — Progress Notes (Signed)
° ° °  SUBJECTIVE:   CHIEF COMPLAINT / HPI:   Feeling sick x 1 day - cough, congestion, fatigue Denies fevers, N/V, CP/SOB Mild diarrhea but no abd pain or bloody/dark stool No hematuria/dysuria Has been using dayquil with minimal relief  Hx of flu and covid last month  PERTINENT  PMH / PSH: HTN. Was recently started on plaquenil  by rheumatology for ?RA  OBJECTIVE:   BP 125/74   Pulse 81   Temp 98.8 F (37.1 C) (Oral)   Ht 5' 3 (1.6 m)   Wt 193 lb 6 oz (87.7 kg)   LMP 05/21/2024 (Approximate)   SpO2 100%   BMI 34.25 kg/m    General: NAD, pleasant, able to participate in exam Cardiac: RRR, no murmurs auscultated Respiratory: CTAB, normal WOB Abdomen: soft, non-tender, non-distended, normoactive bowel sounds Extremities: warm and well perfused, no edema or cyanosis Skin: warm and dry, no rashes noted Neuro: alert, no obvious focal deficits, speech normal Psych: Normal affect and mood  ASSESSMENT/PLAN:    Assessment & Plan Viral illness Influenza B COVID-19 Suspect viral URI No red flags in history or exam today. Afebrile and hemodynamically stable, well appearing. COVID/flu swab POSITIVE for both covid and flu b - Rx tamiflu Discussed supportive care and return precautions Recommend hydration, air humidifiers, honey water, lozenges.  Discussed OTC cough medication can be hit or miss and are not routinely recommended   Payton Coward, MD [facility]

## 2024-06-23 NOTE — Patient Instructions (Addendum)
 You tested positive for flu and covid  Take tamiflu twice daily for 5 days  Please reach out if you have any difficulty breathing, chest pain, abdominal pain, fevers for multiple days, or if symptoms do not resolve after 2 weeks

## 2024-06-27 ENCOUNTER — Encounter: Payer: Self-pay | Admitting: *Deleted

## 2024-06-29 ENCOUNTER — Ambulatory Visit: Payer: Self-pay | Admitting: Family Medicine

## 2024-06-29 ENCOUNTER — Encounter: Payer: Self-pay | Admitting: Family Medicine

## 2024-06-29 VITALS — BP 139/85 | HR 78 | Ht 63.0 in | Wt 195.4 lb

## 2024-06-29 DIAGNOSIS — U071 COVID-19: Secondary | ICD-10-CM | POA: Diagnosis not present

## 2024-06-29 NOTE — Progress Notes (Signed)
° ° °  SUBJECTIVE:   CHIEF COMPLAINT / HPI:   Presents for COVID 19 testing to return to work. Symptoms have improved, but cough lingers. Patient denies any new fevers or other symptoms  PERTINENT  PMH / PSH: NA  OBJECTIVE:   BP 139/85   Pulse 78   Ht 5' 3 (1.6 m)   Wt 195 lb 6.4 oz (88.6 kg)   LMP 05/21/2024 (Approximate)   SpO2 100%   BMI 34.61 kg/m   General: A&O, NAD Cardiac: RRR, no m/r/g Respiratory: CTAB, normal WOB, no w/c/r  ASSESSMENT/PLAN:   Assessment & Plan COVID-19 - counseled patient that negative test is not recommended for return to work, as test can remain positive for several weeks past when the infection clears. - provided note for clearance to work.    Marilyn Pinal, DO [facility]

## 2024-06-29 NOTE — Patient Instructions (Signed)
 It was wonderful to see you today!  If your test comes back positive, it does not mean you are still contagious. COVID tests can be positive for up to 3 weeks following resolution of symptoms.   Please call [phone] with any questions about today's appointment.   If you need any additional refills, please call your pharmacy before calling the office.  Marilyn Pinal, DO Family Medicine

## 2024-07-01 ENCOUNTER — Other Ambulatory Visit: Payer: Self-pay | Admitting: Obstetrics and Gynecology

## 2024-07-01 ENCOUNTER — Encounter: Payer: Self-pay | Admitting: Obstetrics and Gynecology

## 2024-07-01 DIAGNOSIS — Z01818 Encounter for other preprocedural examination: Secondary | ICD-10-CM

## 2024-07-01 LAB — NOVEL CORONAVIRUS, NAA: SARS-CoV-2, NAA: NOT DETECTED

## 2024-07-01 NOTE — Telephone Encounter (Signed)
 Med refill request: Ibuprofen  800 mg and Zofran  8 mg Last OV 06/22/24 Dr. Dallie Last AEX: none Next AEX: none scheduled Last MMG (if hormonal med) n/a Last refill: 01/19/2024 Surgery referral placed by Dr. Dallie 06/27/24, for robotic assisted total laparoscopic hysterectomy, bilateral salpingectomy, cystoscopy. Refills sent to provider for approval or denial as appropriate.

## 2024-07-04 ENCOUNTER — Ambulatory Visit: Payer: Self-pay | Admitting: Family Medicine

## 2024-07-27 NOTE — Telephone Encounter (Signed)
 Dr. Dallie -please advise on work note.

## 2024-07-27 NOTE — Telephone Encounter (Signed)
 Work Physicist, medical provided.

## 2024-07-29 ENCOUNTER — Ambulatory Visit: Payer: Self-pay | Admitting: Family Medicine

## 2024-07-29 ENCOUNTER — Encounter: Payer: Self-pay | Admitting: Family Medicine

## 2024-07-29 VITALS — BP 146/98 | HR 75 | Ht 64.0 in | Wt 199.8 lb

## 2024-07-29 DIAGNOSIS — Z01818 Encounter for other preprocedural examination: Secondary | ICD-10-CM | POA: Diagnosis not present

## 2024-07-29 DIAGNOSIS — D500 Iron deficiency anemia secondary to blood loss (chronic): Secondary | ICD-10-CM

## 2024-07-29 NOTE — Progress Notes (Signed)
" ° ° °  SUBJECTIVE:   CHIEF COMPLAINT / HPI:   Perioperative clearance for robotic hysterectomy, bilateral salpingectomy, cystoscopy with Dr. Dallie. Denies any chest pain or shortness of breath, denies any trouble getting up the stairs or completing ADLs. Denies easy bruising. Denies headaches, vision changes.  No snoring or choking during sleep. No history of heart disease or diabetes. Hx of hypertension, was on amlodipine  5mg  but stopped taking it.   PERTINENT  PMH / PSH: Uterine fibroids   OBJECTIVE:   BP (!) 146/98   Pulse 75   Ht 5' 4 (1.626 m)   Wt 199 lb 12.8 oz (90.6 kg)   SpO2 96%   BMI 34.30 kg/m   General: A&O, NAD HEENT: No sign of trauma, EOM grossly intact Cardiac: RRR, flow murmur Respiratory: CTAB, normal WOB, no w/c/r GI: Soft, NTTP, non-distended  Extremities: NTTP, no peripheral edema. Neuro: Normal gait, moves all four extremities appropriately. Psych: Appropriate mood and affect Skin: no obvious bruising noted   ASSESSMENT/PLAN:   Assessment & Plan Iron deficiency anemia due to chronic blood loss History of anemia, last Hg wnl, though will recheck today to ensure given she has had a period since November. Does have systolic murmur, likely flow 2/2 to anemia. Patient continues to be asymptomatic.  - CBC, TIBC, Ferritin Pre-operative clearance Here for clearance for robotic hysterectomy. BP elevated today on recheck, reports it is likely 2/2 to being awake late and being tired. Will have patient restart on amlodipine  5 mg. EKG shows NSR. Patient okay to continue hydroxychloroquine  prior to surgery. RCRI <1%, patient is at low risk for cardiovascular event if this surgery is to completed.  - low risk <1% cardiac risk - continue amlodipine  5 mg     Noor Aria Pickrell, MD [facility] "

## 2024-07-29 NOTE — Assessment & Plan Note (Signed)
 History of anemia, last Hg wnl, though will recheck today to ensure given she has had a period since November. Does have systolic murmur, likely flow 2/2 to anemia. Patient continues to be asymptomatic.  - CBC, TIBC, Ferritin

## 2024-07-29 NOTE — Patient Instructions (Addendum)
 It was wonderful to see you today.  Please bring ALL of your medications with you to every visit.   Today we talked about:  I am checking your blood and iron today.  Your EKG looked good. We will fax our form to your OBGYN  Thank you for choosing [facility].   Please call [phone] with any questions about today's appointment.  Please arrive at least 15 minutes prior to your scheduled appointments.   If you had blood work today, I will send you a MyChart message or a letter if results are normal. Otherwise, I will give you a call.   If you had a referral placed, they will call you to set up an appointment. Please give us  a call if you don't hear back in the next 2 weeks.   If you need additional refills before your next appointment, please call your pharmacy first.   Do you need your medications delivered to your home?   Well send your prescription to the [facility] for delivery.          Address: [address] [place], [place], [place] [address]          Phone: [phone]  Please call the [facility] to speak with a pharmacist and set up your home medication delivery. If you have any questions, feel free to contact us  -- were happy to help!  Other [facility] that offer affordable prices on both prescriptions and over-the-counter items, as well as convenient services like vaccinations, are  [facility], at [facility]         Address:  [address], [place], [place] [address]         Phone: [phone]  [facility], located in the [facility]        Address: [address], [place], [place] [address]        Phone: [phone]  [facility], at [facility]       Address: [address], [place], [place] [address]       Phone: [phone]  [facility], at [facility]       Address: [address], [address], [place], [place] [address]       Phone: [phone]  You should follow up in our clinic in 2 weeks   Gloriann Ogren, MD Family Medicine

## 2024-07-30 LAB — CBC WITH DIFFERENTIAL/PLATELET
Basophils Absolute: 0 x10E3/uL (ref 0.0–0.2)
Basos: 1 %
EOS (ABSOLUTE): 0.1 x10E3/uL (ref 0.0–0.4)
Eos: 1 %
Hematocrit: 34.4 % (ref 34.0–46.6)
Hemoglobin: 12 g/dL (ref 11.1–15.9)
Immature Grans (Abs): 0 x10E3/uL (ref 0.0–0.1)
Immature Granulocytes: 0 %
Lymphocytes Absolute: 1.7 x10E3/uL (ref 0.7–3.1)
Lymphs: 24 %
MCH: 32.8 pg (ref 26.6–33.0)
MCHC: 34.9 g/dL (ref 31.5–35.7)
MCV: 94 fL (ref 79–97)
Monocytes Absolute: 0.5 x10E3/uL (ref 0.1–0.9)
Monocytes: 7 %
Neutrophils Absolute: 4.7 x10E3/uL (ref 1.4–7.0)
Neutrophils: 67 %
Platelets: 236 x10E3/uL (ref 150–450)
RBC: 3.66 x10E6/uL — ABNORMAL LOW (ref 3.77–5.28)
RDW: 13.2 % (ref 11.7–15.4)
WBC: 7 x10E3/uL (ref 3.4–10.8)

## 2024-07-30 LAB — IRON,TIBC AND FERRITIN PANEL
Ferritin: 15 ng/mL (ref 15–150)
Iron Saturation: 21 % (ref 15–55)
Iron: 76 ug/dL (ref 27–159)
Total Iron Binding Capacity: 354 ug/dL (ref 250–450)
UIBC: 278 ug/dL (ref 131–425)

## 2024-08-01 ENCOUNTER — Ambulatory Visit: Payer: Self-pay | Admitting: Family Medicine

## 2024-08-11 ENCOUNTER — Telehealth: Payer: Self-pay

## 2024-08-11 NOTE — Progress Notes (Signed)
 Complex Care Management Note Care Guide Note  08/11/2024 Name: [name] MRN: [id number] DOB: [birth year]   Complex Care Management Outreach Attempts: An unsuccessful telephone outreach was attempted today to offer the patient information about available complex care management services.  Follow Up Plan:  Additional outreach attempts will be made to offer the patient complex care management information and services.   Encounter Outcome:  No Answer    Jon Colt [facility]  [facility] Guide, Phone: [phone] Fax: [phone] Website: [email or web address]

## 2024-08-15 ENCOUNTER — Telehealth: Payer: Self-pay

## 2024-08-15 NOTE — Progress Notes (Signed)
 Complex Care Management Note Care Guide Note  08/15/2024 Name: [name] MRN: [id number] DOB: [birth year]   Complex Care Management Outreach Attempts: An unsuccessful telephone outreach was attempted today to offer the patient information about available complex care management services.  Follow Up Plan:  Additional outreach attempts will be made to offer the patient complex care management information and services.   Encounter Outcome:  No Answer   Jon Colt [facility]  [facility] Guide, Phone: [phone] Fax: [phone] Website: [email or web address]

## 2024-08-17 ENCOUNTER — Telehealth: Payer: Self-pay

## 2024-08-17 NOTE — Progress Notes (Signed)
 Complex Care Management Note Care Guide Note  08/17/2024 Name: [name] MRN: [id number] DOB: [birth year]   Complex Care Management Outreach Attempts: A third unsuccessful outreach was attempted today to offer the patient with information about available complex care management services.  Follow Up Plan:  No further outreach attempts will be made at this time. We have been unable to contact the patient to offer or enroll patient in complex care management services.  Encounter Outcome:  No Answer and unable to leave a message     Jon Colt [facility]  [facility] Guide, Phone: [phone] Fax: [phone] Website: [email or web address]

## 2024-08-18 NOTE — Progress Notes (Unsigned)
 "  Office Visit Note  Patient: [name]             Date of Birth: 10/31/1980           MRN: [id number]             PCP: Alba Sharper, MD Referring: Alba Sharper, MD Visit Date: 08/25/2024 Occupation: Data Unavailable  Subjective:  No chief complaint on file.   History of Present Illness: [name] is a 44 y.o. female with possible early RA, positive RF, positive CCP and pain in both knee who is presenting for a 3 month follow up. She was last seen on 05/24/2024 where she was initiated on HCQ 400mg  daily.     Activities of Daily Living:  Patient reports morning stiffness for *** {minute/hour:19697}.   Patient {ACTIONS;DENIES/REPORTS:21021675::Denies} nocturnal pain.  Difficulty dressing/grooming: {ACTIONS;DENIES/REPORTS:21021675::Denies} Difficulty climbing stairs: {ACTIONS;DENIES/REPORTS:21021675::Denies} Difficulty getting out of chair: {ACTIONS;DENIES/REPORTS:21021675::Denies} Difficulty using hands for taps, buttons, cutlery, and/or writing: {ACTIONS;DENIES/REPORTS:21021675::Denies}  No Rheumatology ROS completed.   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   Hypertension 02/08/2023   Iron deficiency anemia 02/05/2023   Dysmenorrhea 05/08/2022   External hemorrhoid 01/02/2021    Past Medical History:  Diagnosis Date   Abnormal uterine bleeding (AUB)    Anal fissure    GI--- dr leigh   Anemia 03/11/2024   Anxiety    Depression    GERD (gastroesophageal reflux disease)    EGD 03-30-2023 w/ gastritis   Heart murmur    Hemorrhoids    s/p  hemorrhoid banding ligation 10-08-2023 by dr leigh    History of trichomonal vaginitis 01/2023   Hypertension    Iron deficiency anemia due to chronic blood loss    treated w/ iron infusions;   01/ 2025 x2;   08/ 2024 x2   Seasonal allergic rhinitis    Uterine leiomyoma    Vitamin D  deficiency 11/13/2016    Family History  Problem Relation Age of Onset   Rheum arthritis Father    Diabetes Father    Hypertension Father    Hyperlipidemia Father    Diabetes Paternal Grandmother    Hypertension Paternal Grandmother    Stroke Paternal Grandmother    Cancer Maternal Aunt        breast   Stomach cancer Neg Hx    Esophageal cancer Neg Hx    Colon cancer Neg Hx    Rectal cancer Neg Hx    Past Surgical History:  Procedure Laterality Date   CESAREAN SECTION  04/22/2000   @[facility]  by dr b. nikki   CESAREAN SECTION WITH BILATERAL TUBAL LIGATION Bilateral 11/03/2014   Procedure: CESAREAN SECTION WITH BILATERAL TUBAL LIGATION;  Surgeon: Carlin DELENA Centers, MD;  Location: [facility];  Service: Obstetrics;  Laterality: Bilateral;   CHOLECYSTECTOMY, LAPAROSCOPIC  01/26/2001   @[facility]   by dr d. blackman   COLONOSCOPY WITH ESOPHAGOGASTRODUODENOSCOPY (EGD)  03/30/2023   dr leigh   HEMORRHOID SURGERY N/A 03/13/2024   Procedure: HEMORRHOIDECTOMY;  Surgeon: Debby Hila, MD;  Location: [facility] OR;  Service: General;  Laterality: N/A;  INJECTION OF BOTOX    HYSTEROSCOPY N/A 12/18/2023   Procedure: ABLATION, ENDOMETRIUM, HYSTEROSCOPIC;  Surgeon:  Dallie Vera GAILS, MD;  Location[facility];  Service: Gynecology;  Laterality: N/A;  NOVASURE   HYSTEROSCOPY WITH MYOMECTOMY N/A 12/18/2023   Procedure: HYSTEROSCOPY WITH MYOSURE MYOMECTOMY;  Surgeon: Dallie Vera GAILS, MD;  Location: [facility] OR;  Service: Gynecology;  Laterality: N/A;   INCISIONAL HERNIA REPAIR N/A 03/27/2016   Procedure: LAPAROSCOPIC ASSISTED REPAIR OF INCARCERATED  INCISIONAL HERNIA;  Surgeon: Camellia Blush, MD;  Location: [facility];  Service: General;  Laterality: N/A;   INSERTION OF MESH N/A 03/27/2016   Procedure:  INSERTION OF MESH;  Surgeon: Camellia Blush, MD;  Location: [facility];  Service: General;  Laterality: N/A;   REPEAT CESAREAN SECTION  07/10/2008   @[facility]   by dr c. rudy   Social History[1] Social History   Social History Narrative   Not on file     Immunization History  Administered Date(s) Administered   Influenza, Seasonal, Injecte, Preservative Fre 04/08/2023, 03/28/2024   Influenza,inj,Quad PF,6+ Mos 09/08/2022   Moderna Sars-Covid-2 Vaccination 11/30/2019, 12/28/2019   Pfizer(Comirnaty)Fall Seasonal Vaccine 12 years and older 04/08/2023   Pneumococcal Polysaccharide-23 11/04/2014   Tdap 11/04/2014     Objective: Vital Signs: There were no vitals taken for this visit.   Physical Exam   Musculoskeletal Exam: ***  CDAI Exam: CDAI Score: -- Patient Global: --; Provider Global: -- Swollen: --; Tender: -- Joint Exam 08/25/2024   No joint exam has been documented for this visit   There is currently no information documented on the homunculus. Go to the Rheumatology activity and complete the homunculus joint exam.  Investigation: No additional findings.  Imaging: No results found.  Recent Labs: Lab Results  Component Value Date   WBC 7.0 07/29/2024   HGB 12.0 07/29/2024   PLT 236 07/29/2024   NA 140 03/11/2024   K 3.8 03/11/2024   CL 105 03/11/2024   CO2 25 03/11/2024   GLUCOSE 93 03/11/2024   BUN 13 03/11/2024   CREATININE 0.64 03/11/2024   BILITOT 0.3 03/11/2024   ALKPHOS 54 03/11/2024   AST 27 03/11/2024   ALT 24 03/11/2024   PROT 5.8 (L) 03/11/2024   ALBUMIN 3.3 (L) 03/11/2024   CALCIUM 8.6 (L) 03/11/2024   GFRAA >60 03/21/2016    Speciality Comments: No specialty comments available.  Procedures:  No procedures performed Allergies: Levofloxacin   Assessment / Plan:     Visit Diagnoses: No diagnosis found.  Orders: No orders of the defined types were placed in this encounter.  No orders of the defined types were placed in this  encounter.   Face-to-face time spent with patient was *** minutes. Greater than 50% of time was spent in counseling and coordination of care.  Follow-Up Instructions: No follow-ups on file.   Alfonso Patterson, LPN  Note - This record has been created using [employer].  Chart creation errors have been sought, but may not always  have been located. Such creation errors do not reflect on  the standard of medical care.    [1]  Social History Tobacco Use   Smoking status: Former    Current packs/day: 0.00    Types: Cigarettes    Quit date: 07/15/2015    Years since quitting: 9.1    Passive exposure: Never   Smokeless tobacco: Never   Tobacco comments:    12-15-2023  pt stated stopped age 9,  started at age 44  Vaping Use   Vaping status: Never Used  Substance Use Topics   Alcohol use: No    Alcohol/week: 0.0  standard drinks of alcohol   Drug use: Yes    Types: Marijuana    Comment: 12-15-2023  last smoked marijuana 1 week ago   "

## 2024-08-22 ENCOUNTER — Ambulatory Visit: Admitting: Family Medicine

## 2024-08-25 ENCOUNTER — Ambulatory Visit

## 2024-08-25 DIAGNOSIS — R7689 Other specified abnormal immunological findings in serum: Secondary | ICD-10-CM

## 2024-08-25 DIAGNOSIS — M25561 Pain in right knee: Secondary | ICD-10-CM

## 2024-09-19 ENCOUNTER — Encounter: Admitting: Obstetrics and Gynecology

## 2024-10-04 ENCOUNTER — Ambulatory Visit (HOSPITAL_COMMUNITY): Admit: 2024-10-04 | Admitting: Obstetrics and Gynecology

## 2024-10-04 SURGERY — HYSTERECTOMY, TOTAL, LAPAROSCOPIC, ROBOT-ASSISTED WITH SALPINGECTOMY
Anesthesia: General
